# Patient Record
Sex: Female | Born: 1990 | Race: Black or African American | Hispanic: No | Marital: Single | Smoking: Never smoker
Health system: Southern US, Community
[De-identification: ages and names within clinical notes are randomized; demographics above are authoritative.]

## PROBLEM LIST (undated history)

## (undated) DIAGNOSIS — F29 Unspecified psychosis not due to a substance or known physiological condition: Secondary | ICD-10-CM

## (undated) DIAGNOSIS — J45909 Unspecified asthma, uncomplicated: Secondary | ICD-10-CM

## (undated) HISTORY — DX: Unspecified asthma, uncomplicated: J45.909

## (undated) HISTORY — PX: NO PAST SURGERIES: SHX2092

---
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
## 2013-12-02 ENCOUNTER — Emergency Department (HOSPITAL_COMMUNITY): Payer: BC Managed Care – PPO

## 2013-12-02 ENCOUNTER — Encounter (HOSPITAL_COMMUNITY): Payer: Self-pay | Admitting: Emergency Medicine

## 2013-12-02 ENCOUNTER — Emergency Department (HOSPITAL_COMMUNITY)
Admission: EM | Admit: 2013-12-02 | Discharge: 2013-12-02 | Disposition: A | Discharge status: Home / Self Care | Payer: BC Managed Care – PPO | Attending: Emergency Medicine | Admitting: Emergency Medicine

## 2013-12-02 DIAGNOSIS — Z792 Long term (current) use of antibiotics: Secondary | ICD-10-CM | POA: Insufficient documentation

## 2013-12-02 DIAGNOSIS — R0602 Shortness of breath: Secondary | ICD-10-CM | POA: Insufficient documentation

## 2013-12-02 DIAGNOSIS — Z79899 Other long term (current) drug therapy: Secondary | ICD-10-CM | POA: Insufficient documentation

## 2013-12-02 DIAGNOSIS — M129 Arthropathy, unspecified: Secondary | ICD-10-CM | POA: Insufficient documentation

## 2013-12-02 DIAGNOSIS — R011 Cardiac murmur, unspecified: Secondary | ICD-10-CM | POA: Insufficient documentation

## 2013-12-02 DIAGNOSIS — F411 Generalized anxiety disorder: Secondary | ICD-10-CM | POA: Insufficient documentation

## 2013-12-02 DIAGNOSIS — Z872 Personal history of diseases of the skin and subcutaneous tissue: Secondary | ICD-10-CM | POA: Insufficient documentation

## 2013-12-02 DIAGNOSIS — R002 Palpitations: Secondary | ICD-10-CM

## 2013-12-02 MED ORDER — CEPHALEXIN 500 MG PO CAPS: 500.0000 mg | ORAL_CAPSULE | Freq: Four times a day (QID) | ORAL | Status: DC

## 2013-12-02 NOTE — ED Provider Notes (Signed)
Patient states she had an abscess on her buttock and she was upset about considering what the treatment would be. She states she is started on her doxycycline and felt like her heart was racing. When I ask her if her heart is racing at this time she states "I can't tell". She states this never happened before.  On exam patient does have a harsh short systolic murmur that's heard best in the left upper sternal border.  Medical screening examination/treatment/procedure(s) were conducted as a shared visit with non-physician practitioner(s) and myself.  I personally evaluated the patient during the encounter.  EKG Interpretation    Date/Time:  Monday December 02 2013 18:59:33 EST Ventricular Rate:  99 PR Interval:  140 QRS Duration: 81 QT Interval:  355 QTC Calculation: 456 R Axis:   55 Text Interpretation:  Sinus rhythm Normal ECG No old tracing to compare Confirmed by Danford Tat  MD-I, Isebella Upshur ([id number]) on 12/02/2013 10:50:08 PM            Devoria AlbeIva Aliany Fiorenza, MD, Franz DellFACEP   Shanikka Wonders L Diarra Ceja, MD 12/02/13 2250

## 2013-12-02 NOTE — ED Notes (Addendum)
Pt c/o chest pain and SOB x 1 week. No issues of heart problems. Pt states she feels lke her heart is beating irregular. Pt states every since they put her on doxycycline for an abscess she has been feeling like this

## 2013-12-02 NOTE — Discharge Instructions (Signed)
Take the prescribed medication as directed if abscess begins to worsen. May use zyrtec and over the counter chloraseptic spray or salt water gargles for postnasal drip and sore throat. Follow-up with [facility] for further evaluation of your heart murmur. Return to the ED for new or worsening symptoms.

## 2013-12-02 NOTE — ED Notes (Signed)
Pt states she feels like her heart is racing and has noticed some aching. She admits to a NPC. Also states she has been under some stress: school and a minor medical procedure (I&D).

## 2013-12-02 NOTE — ED Provider Notes (Signed)
CSN: [id number]     Arrival date & time 12/02/13  1824 History   First MD Initiated Contact with Patient 12/02/13 2039     Chief Complaint  Patient presents with  . Chest Pain  . Shortness of Breath   (Consider location/radiation/quality/duration/timing/severity/associated sxs/prior Treatment) Patient is a 23 y.o. female presenting with palpitations and shortness of breath. The history is provided by the patient and medical records.  Palpitations Associated symptoms: shortness of breath   Shortness of Breath  This is a 23 year old female with a significant past medical history presenting to the ED for palpitations and shortness of breath for the past week. Pt states sx seem worse at night when she is attempting to sleep, in turn making her anxious and unable to sleep.  Patient has no prior cardiac history. No family history of sudden cardiac death. Patient states she was recently started on doxycycline for an abscess, symptoms began shortly after she started this medication. She has never taken this medication in the past.  Pt was prescribed a 10 day course, only took 4 days of the medication.  States abscess has improved, but has not completely resolved.  Pt does not some recurrent sinus congestion and PND.  Denies fevers, sweats, chills, nausea, or vomiting.  No intervention PTA.  VS stable on arrival.  Past Medical History  Diagnosis Date  . Arthritis    History reviewed. No pertinent past surgical history. No family history on file. History  Substance Use Topics  . Smoking status: Never Smoker   . Smokeless tobacco: Not on file  . Alcohol Use: No   OB History   Grav Para Term Preterm Abortions TAB SAB Ect Mult Living                 Review of Systems  Respiratory: Positive for shortness of breath.   Cardiovascular: Positive for palpitations.  All other systems reviewed and are negative.    Allergies  Review of patient's allergies indicates no known allergies.  Home  Medications   Current Outpatient Rx  Name  Route  Sig  Dispense  Refill  . albuterol (PROVENTIL HFA;VENTOLIN HFA) 108 (90 BASE) MCG/ACT inhaler   Inhalation   Inhale 2 puffs into the lungs every 6 (six) hours as needed for wheezing or shortness of breath.         . Chlorpheniramine-Acetaminophen (CORICIDIN HBP COLD/FLU PO)   Oral   Take 2 capsules by mouth once as needed (congestion).         Marland Kitchen doxycycline (VIBRAMYCIN) 100 MG capsule   Oral   Take 100 mg by mouth 2 (two) times daily.         Marland Kitchen ibuprofen (ADVIL,MOTRIN) 800 MG tablet   Oral   Take 800 mg by mouth every 8 (eight) hours as needed for moderate pain.         . Multiple Vitamins-Minerals (HM MULTIVITAMIN ADULT GUMMY) CHEW   Oral   Chew 2 each by mouth daily.          BP 154/73  Pulse 93  Temp(Src) 98.7 F (37.1 C) (Oral)  Resp 20  SpO2 100%  LMP 12/31/2012  Physical Exam  Nursing note and vitals reviewed. Constitutional: She is oriented to person, place, and time. She appears well-developed and well-nourished. No distress.  HENT:  Head: Normocephalic and atraumatic.  Right Ear: Tympanic membrane and ear canal normal.  Left Ear: Tympanic membrane and ear canal normal.  Nose: Nose normal.  Mouth/Throat: Uvula  is midline, oropharynx is clear and moist and mucous membranes are normal.  PND noted in oropharynx; tonsils normal in appearance bilaterally  Eyes: Conjunctivae and EOM are normal. Pupils are equal, round, and reactive to light.  Neck: Normal range of motion. Neck supple.  Cardiovascular: Normal rate and regular rhythm.   Murmur heard. Pulmonary/Chest: Effort normal and breath sounds normal. No respiratory distress. She has no wheezes.  Musculoskeletal: Normal range of motion.  Neurological: She is alert and oriented to person, place, and time.  Skin: Skin is warm and dry. She is not diaphoretic.  Psychiatric: She has a normal mood and affect.    ED Course  Procedures (including critical  care time) Labs Review Labs Reviewed - No data to display Imaging Review Dg Chest 2 View  12/02/2013   CLINICAL DATA:  Chest pain.  Shortness of breath.  Cough.  EXAM: CHEST  2 VIEW  COMPARISON:  None.  FINDINGS: Cardiomediastinal silhouette unremarkable. Lungs clear. Bronchovascular markings normal. Pulmonary vascularity normal. No pneumothorax. No pleural effusions. Visualized bony thorax intact.  IMPRESSION: Normal examination.   Electronically Signed   By: Hulan Saashomas  Lawrence M.D.   On: 12/02/2013 21:36    EKG Interpretation    Date/Time:  Monday December 02 2013 18:59:33 EST Ventricular Rate:  99 PR Interval:  140 QRS Duration: 81 QT Interval:  355 QTC Calculation: 456 R Axis:   55 Text Interpretation:  Sinus rhythm Normal ECG No old tracing to compare Confirmed by KNAPP  MD-I, IVA ([id number]) on 12/02/2013 10:50:08 PM            MDM   1. Palpitations    EKG NSR, no acute ischemic changes.  CXR clear.  At this time i have low suspicion for ACS, PE dissection, or other acute cardiac event.  Palpitations may be due to doxycycline vs anxiety.  On PE slight murmur is heard.  Pt will be referred to cardiology for further evaluation of this.  Advised she may wish to start a daily zyrtec as needed for PND, may use OTC chloraseptic or salt water gargles for sore throat.  Rx keflex should abscess recur.  Discussed plan with pt and mom, they acknowledged understanding and agreed with plan of care.  Strict return precautions advised for new or worsening sx.  Garlon HatchetLisa M Gentry Pilson, PA-C 12/03/13 0002

## 2013-12-03 NOTE — ED Provider Notes (Signed)
See prior note   Ward GivensIva L Jossue Rubenstein, MD 12/03/13 [phone]

## 2013-12-04 ENCOUNTER — Encounter: Payer: BC Managed Care – PPO | Admitting: Cardiology

## 2013-12-06 ENCOUNTER — Encounter (INDEPENDENT_AMBULATORY_CARE_PROVIDER_SITE_OTHER): Payer: BC Managed Care – PPO

## 2013-12-06 ENCOUNTER — Ambulatory Visit (INDEPENDENT_AMBULATORY_CARE_PROVIDER_SITE_OTHER): Payer: BC Managed Care – PPO | Admitting: Cardiology

## 2013-12-06 ENCOUNTER — Encounter: Payer: Self-pay | Admitting: Radiology

## 2013-12-06 ENCOUNTER — Encounter: Payer: Self-pay | Admitting: Cardiology

## 2013-12-06 VITALS — BP 124/82 | HR 86 | Ht 63.0 in | Wt 151.0 lb

## 2013-12-06 DIAGNOSIS — R0789 Other chest pain: Secondary | ICD-10-CM | POA: Insufficient documentation

## 2013-12-06 DIAGNOSIS — R011 Cardiac murmur, unspecified: Secondary | ICD-10-CM | POA: Insufficient documentation

## 2013-12-06 DIAGNOSIS — R002 Palpitations: Secondary | ICD-10-CM | POA: Insufficient documentation

## 2013-12-06 NOTE — Progress Notes (Signed)
  [address] [place], [place]  [address] Phone: [phone] Fax:  [phone]  Date:  12/06/2013   ID:  Veronica BaileyKyla Arias, DOB 1991/01/08, MRN [id number]  PCP:  No primary provider on file.  Cardiologist:  Armanda Magicraci Jabarie Pop, MD     History of Present Illness: Veronica BaileyKyla Arias is a 23 y.o. female  with no significant past medical history presented to the ED for palpitations and shortness of breath for the past week. Pt states sx seem worse at night when she is attempting to sleep, in turn making her anxious and unable to sleep. Patient has no prior cardiac history. No family history of sudden cardiac death. Patient states she was recently started on doxycycline for an abscess, symptoms began shortly after she started this medication. She has never taken this medication in the past. Pt was prescribed a 10 day course, only took 4 days of the medication. She was noted to have a heart mumur on exam in the Er and is now here for further evaluation.  Since stopping the antibiotics the SOB has completely resolved and the palpitations have significantly improved.  She says that the palpitations occur a few times a week.  She also says that her chest has felt tightness which is in her upper chest and radiates to the right side.  It usually occurs when she is lying down and is worse when she is tired.  If she breathes slowly is resolves.  She denies any dizziness or syncope.  The palpitations are described as skipping and sometimes racing.  Wt Readings from Last 3 Encounters:  12/06/13 151 lb (68.493 kg)     Past Medical History  Diagnosis Date  . Arthritis     Current Outpatient Prescriptions  Medication Sig Dispense Refill  . albuterol (PROVENTIL HFA;VENTOLIN HFA) 108 (90 BASE) MCG/ACT inhaler Inhale 2 puffs into the lungs every 6 (six) hours as needed for wheezing or shortness of breath.      . Multiple Vitamins-Minerals (HM MULTIVITAMIN ADULT GUMMY) CHEW Chew 2 each by mouth daily.       No  current facility-administered medications for this visit.    Allergies:   No Known Allergies  Social History:  The patient  reports that she has never smoked. She does not have any smokeless tobacco history on file. She reports that she does not drink alcohol.   Family History:  The patient's family history is not on file.   ROS:  Please see the history of present illness.      All other systems reviewed and negative.   PHYSICAL EXAM: VS:  BP 124/82  Pulse 86  Ht 5\' 3"  (1.6 m)  Wt 151 lb (68.493 kg)  BMI 26.76 kg/m2  LMP 12/31/2012 Well nourished, well developed, in no acute distress HEENT: normal Neck: no JVD Cardiac:  normal S1, S2; RRR;1/6 SM at RUSB to LLSB Lungs:  clear to auscultation bilaterally, no wheezing, rhonchi or rales Abd: soft, nontender, no hepatomegaly Ext: no edema Skin: warm and dry Neuro:  CNs 2-12 intact, no focal abnormalities noted  EKG:  NSR with  No ST changes  ASSESSMENT AND PLAN:  1. Palpitations - she drinks very little caffeine  - Event monitor to assess for arrhythmias 2. Heart murmur  - 2D echo to assess murmur 3. SOB resolved 4. Chest pain - atypical ? Anxiety  - ETT  Followup with me in 4 weeks  Signed, Armanda Magicraci Gaetana Kawahara, MD 12/06/2013 3:25 PM

## 2013-12-06 NOTE — Patient Instructions (Addendum)
Your physician has recommended that you wear an 30 DAY event monitor. Event monitors are medical devices that record the heart's electrical activity. Doctors most often us these monitors to diagnose arrhythmias. Arrhythmias are problems with the speed or rhythm of the heartbeat. The monitor is a small, portable device. You can wear one while you do your normal daily activities. This is usually used to diagnose what is causing palpitations/syncope (passing out).  Your physician has requested that you have an echocardiogram. Echocardiography is a painless test that uses sound waves to create images of your heart. It provides your doctor with information about the size and shape of your heart and how well your heart's chambers and valves are working. This procedure takes approximately one hour. There are no restrictions for this procedure.  Your physician has requested that you have an exercise tolerance test. For further information please visit [email or web address]. Please also follow instruction sheet, as given.    Your physician recommends that you continue on your current medications as directed. Please refer to the Current Medication list given to you today.  Your physician recommends that you schedule a follow-up appointment in: 4 weeks with Dr. Mayford Knifeurner

## 2013-12-06 NOTE — Progress Notes (Signed)
Patient ID: [name], female   DOB: [birth year], 23 y.o.   MRN: [id number] 30 day lifewatch monitor applied

## 2013-12-12 ENCOUNTER — Other Ambulatory Visit (HOSPITAL_COMMUNITY): Payer: BC Managed Care – PPO

## 2014-01-01 ENCOUNTER — Encounter: Payer: BC Managed Care – PPO | Admitting: Cardiology

## 2014-01-08 ENCOUNTER — Telehealth: Payer: Self-pay | Admitting: Cardiology

## 2014-01-08 NOTE — Telephone Encounter (Signed)
Please let patient know that heart monitor showed NSR with occasional sinus tachycardia up to 136bpm.  Please get a 24 hour Holter to assess average HR over 24 hour period of time

## 2014-01-08 NOTE — Telephone Encounter (Signed)
LVM for pt to return call

## 2014-01-14 NOTE — Telephone Encounter (Signed)
Unable to reach pt. Left verbal message with pts mom who answered phone. Asked her to have her call me back when she could.

## 2014-01-24 NOTE — Telephone Encounter (Signed)
LVM for pt to return call

## 2014-01-30 NOTE — Telephone Encounter (Signed)
LVM for pt to return call

## 2014-02-05 NOTE — Telephone Encounter (Signed)
LVM for pt to return call. I have been unable to get in touch with this pt. Can we please send a certified letter to pt to make sure they know to call us back regarding Heart monitor results?

## 2014-02-13 NOTE — Telephone Encounter (Signed)
Printed and gave to Okey RegalCarol to send Certified letter for pt.

## 2015-06-15 ENCOUNTER — Ambulatory Visit (INDEPENDENT_AMBULATORY_CARE_PROVIDER_SITE_OTHER): Payer: BLUE CROSS/BLUE SHIELD | Admitting: Physician Assistant

## 2015-06-15 VITALS — BP 128/80 | HR 99 | Temp 98.8°F | Resp 16 | Ht 64.0 in | Wt 153.4 lb

## 2015-06-15 DIAGNOSIS — H9202 Otalgia, left ear: Secondary | ICD-10-CM | POA: Diagnosis not present

## 2015-06-15 DIAGNOSIS — H6123 Impacted cerumen, bilateral: Secondary | ICD-10-CM | POA: Diagnosis not present

## 2015-06-15 MED ORDER — CARBAMIDE PEROXIDE 6.5 % OT SOLN: 5.0000 [drp] | Freq: Two times a day (BID) | OTIC | Status: DC

## 2015-06-15 MED ORDER — HYDROCODONE-ACETAMINOPHEN 5-325 MG PO TABS: 1.0000 | ORAL_TABLET | Freq: Four times a day (QID) | ORAL | Status: DC | PRN

## 2015-06-15 NOTE — Patient Instructions (Signed)
Cerumen Impaction °A cerumen impaction is when the wax in your ear forms a plug. This plug usually causes reduced hearing. Sometimes it also causes an earache or dizziness. Removing a cerumen impaction can be difficult and painful. The wax sticks to the ear canal. The canal is sensitive and bleeds easily. If you try to remove a heavy wax buildup with a cotton tipped swab, you may push it in further. °Irrigation with water, suction, and small ear curettes may be used to clear out the wax. If the impaction is fixed to the skin in the ear canal, ear drops may be needed for a few days to loosen the wax. People who build up a lot of wax frequently can use ear wax removal products available in your local drugstore. °SEEK MEDICAL CARE IF:  °You develop an earache, increased hearing loss, or marked dizziness. °Document Released: 12/15/2004 Document Revised: 01/30/2012 Document Reviewed: 02/04/2010 °ExitCare® Patient Information ©2015 [employer]. This information is not intended to replace advice given to you by your health care provider. Make sure you discuss any questions you have with your health care provider. ° °

## 2015-06-15 NOTE — Progress Notes (Signed)
[facility] [address], [place] [address] [phone]  Date:  06/15/2015   Name:  Veronica Arias   DOB:  [birth year]   MRN:  [id number]  PCP:  No PCP Per Patient    History of Present Illness:  Veronica Arias is a 24 y.o. female patient who presents to [facility] for left-sided ear pain that started yesterday. Patient states that 2 days ago she was washing her hair and felt that it could possibly be stopped up but then it went away. The next day it felt completely clogged and she has left-sided ear pain that can slightly radiate down to her throat.  She has no shortness of breath, trouble swallowing, sore throat, ear discharge, but may have some trouble hearing through the left ear.      Patient Active Problem List   Diagnosis Date Noted  . Palpitations 12/06/2013  . Chest pain, atypical 12/06/2013  . Heart murmur 12/06/2013    Past Medical History  Diagnosis Date  . Asthma     History reviewed. No pertinent past surgical history.  History  Substance Use Topics  . Smoking status: Never Smoker   . Smokeless tobacco: Never Used  . Alcohol Use: No    History reviewed. No pertinent family history.  No Known Allergies  Medication list has been reviewed and updated.  Current Outpatient Prescriptions on File Prior to Visit  Medication Sig Dispense Refill  . albuterol (PROVENTIL HFA;VENTOLIN HFA) 108 (90 BASE) MCG/ACT inhaler Inhale 2 puffs into the lungs every 6 (six) hours as needed for wheezing or shortness of breath.    . cetirizine (ZYRTEC) 10 MG tablet Take 10 mg by mouth daily.    . Multiple Vitamins-Minerals (HM MULTIVITAMIN ADULT GUMMY) CHEW Chew 2 each by mouth daily.     No current facility-administered medications on file prior to visit.    ROS ROS otherwise unremarkable unless listed above.  Physical Examination: BP 128/80 mmHg  Pulse 99  Temp(Src) 98.8 F (37.1 C) (Oral)  Resp 16  Ht  (1.626 m)  Wt 153 lb 6.4 oz (69.582 kg)   BMI 26.32 kg/m2  SpO2 98%  LMP 06/08/2015 Ideal Body Weight: Weight in (lb) to have BMI = 25: 145.3  Physical Exam Alert, cooperative, oriented 4 in no acute distress.  No lymphadenopathy detected.  Right and left external ear are normal without tenderness.  Cerumen impaction blocking visualization bilaterally.  Oral mucosa normal without erythema, edema, or exudate.  Right ear irrigated successfully with normal TM visualized.  The left ear continues to have generous cerumen despite multiple irrigations and softener cerumen solution.  Currette was used unsuccessfully.  Assessment and Plan: 24 year old female is here today for complaint of left ear pain.  Irrigation performed multiple times proceeding colace.  Left ear still has considerable cerumen impaction close to the TM.  I have advised her to use debrox tonight and tomorrow.  She will return to clinic for another irrigation after solution break down, to avoid continued irritation of this left ear.     1. Cerumen impaction, bilateral  Trena Platt, PA-C [facility] 06/15/2015 1:59 PM

## 2015-06-16 ENCOUNTER — Ambulatory Visit (INDEPENDENT_AMBULATORY_CARE_PROVIDER_SITE_OTHER): Payer: BLUE CROSS/BLUE SHIELD | Admitting: Physician Assistant

## 2015-06-16 VITALS — BP 128/70 | HR 84 | Temp 99.6°F | Ht 64.0 in | Wt 154.4 lb

## 2015-06-16 DIAGNOSIS — H6122 Impacted cerumen, left ear: Secondary | ICD-10-CM | POA: Diagnosis not present

## 2015-06-17 NOTE — Progress Notes (Signed)
   Subjective:    Patient ID: Veronica Arias, female    DOB: [birth year], 24 y.o.   MRN: [id number]  HPI Chief Complaint  Patient presents with  . ear irrigation    left ear (Per Thomasene Ripple) , pt was seen yesterday   Patient is returning for follow up of cerumen impaction and ear pain that was found yesterday.  When the TM could not be viewed due to cerumen impaction, multiple irrigations were used to irrigate left ear.  Cerumen had hardened around the TM, so patient was prescribed debrox to use last night and today prior to another irrigation.  She states that she has used the debrox twice.  She has had ear pain, but this has improved.  No fever, dizziness, hearing loss, or drainage.   Review of Systems ROS otherwise unremarkable unless listed above.     Objective:   Physical Exam  Constitutional: She appears well-developed and well-nourished.  HENT:  Head: Normocephalic and atraumatic.  Right Ear: Tympanic membrane, external ear and ear canal normal. No drainage. Tympanic membrane is not perforated and not erythematous.  Left Ear: No drainage. Tympanic membrane is injected. Tympanic membrane is not perforated, not erythematous and not bulging.  No middle ear effusion.       Assessment & Plan:  24 year old female with cerumen impaction is here for 2nd round or irrigation.  The debrox worked to soften cerumen, and it was removed without complication.  TM of left ear mildly injected likely due to multiple irrigation.  She will rtc in 24 hours if pain or contact by phone.  Cerumen debris on tympanic membrane of left ear  Trena Platt, PA-C [facility] 7/27/201611:05 PM

## 2019-06-28 ENCOUNTER — Other Ambulatory Visit: Payer: Self-pay

## 2019-06-29 ENCOUNTER — Encounter (HOSPITAL_COMMUNITY): Payer: Self-pay

## 2019-06-29 ENCOUNTER — Other Ambulatory Visit: Payer: Self-pay

## 2019-06-29 ENCOUNTER — Emergency Department (HOSPITAL_COMMUNITY)
Admission: EM | Admit: 2019-06-29 | Discharge: 2019-06-29 | Disposition: A | Discharge status: Home / Self Care | Payer: BC Managed Care – PPO | Attending: Emergency Medicine | Admitting: Emergency Medicine

## 2019-06-29 DIAGNOSIS — F4311 Post-traumatic stress disorder, acute: Secondary | ICD-10-CM | POA: Insufficient documentation

## 2019-06-29 DIAGNOSIS — F23 Brief psychotic disorder: Secondary | ICD-10-CM | POA: Insufficient documentation

## 2019-06-29 DIAGNOSIS — J45909 Unspecified asthma, uncomplicated: Secondary | ICD-10-CM | POA: Diagnosis not present

## 2019-06-29 DIAGNOSIS — R462 Strange and inexplicable behavior: Secondary | ICD-10-CM | POA: Diagnosis not present

## 2019-06-29 DIAGNOSIS — Z79899 Other long term (current) drug therapy: Secondary | ICD-10-CM | POA: Insufficient documentation

## 2019-06-29 DIAGNOSIS — F430 Acute stress reaction: Secondary | ICD-10-CM | POA: Diagnosis not present

## 2019-06-29 DIAGNOSIS — R410 Disorientation, unspecified: Secondary | ICD-10-CM | POA: Diagnosis not present

## 2019-06-29 DIAGNOSIS — R4182 Altered mental status, unspecified: Secondary | ICD-10-CM | POA: Diagnosis present

## 2019-06-29 DIAGNOSIS — F4322 Adjustment disorder with anxiety: Secondary | ICD-10-CM | POA: Diagnosis not present

## 2019-06-29 LAB — RAPID URINE DRUG SCREEN, HOSP PERFORMED
Amphetamines: NOT DETECTED
Barbiturates: NOT DETECTED
Benzodiazepines: POSITIVE — AB
Cocaine: NOT DETECTED
Opiates: NOT DETECTED
Tetrahydrocannabinol: NOT DETECTED

## 2019-06-29 LAB — CBC WITH DIFFERENTIAL/PLATELET
Abs Immature Granulocytes: 0.03 10*3/uL (ref 0.00–0.07)
Basophils Absolute: 0.1 10*3/uL (ref 0.0–0.1)
Basophils Relative: 1 %
Eosinophils Absolute: 0 10*3/uL (ref 0.0–0.5)
Eosinophils Relative: 0 %
HCT: 41 % (ref 36.0–46.0)
Hemoglobin: 12.7 g/dL (ref 12.0–15.0)
Immature Granulocytes: 0 %
Lymphocytes Relative: 21 %
Lymphs Abs: 2.1 10*3/uL (ref 0.7–4.0)
MCH: 25.9 pg — ABNORMAL LOW (ref 26.0–34.0)
MCHC: 31 g/dL (ref 30.0–36.0)
MCV: 83.7 fL (ref 80.0–100.0)
Monocytes Absolute: 0.6 10*3/uL (ref 0.1–1.0)
Monocytes Relative: 6 %
Neutro Abs: 7.1 10*3/uL (ref 1.7–7.7)
Neutrophils Relative %: 72 %
Platelets: 372 10*3/uL (ref 150–400)
RBC: 4.9 MIL/uL (ref 3.87–5.11)
RDW: 12.3 % (ref 11.5–15.5)
WBC: 9.9 10*3/uL (ref 4.0–10.5)
nRBC: 0 % (ref 0.0–0.2)

## 2019-06-29 LAB — COMPREHENSIVE METABOLIC PANEL
ALT: 137 U/L — ABNORMAL HIGH (ref 0–44)
AST: 59 U/L — ABNORMAL HIGH (ref 15–41)
Albumin: 4.6 g/dL (ref 3.5–5.0)
Alkaline Phosphatase: 53 U/L (ref 38–126)
Anion gap: 10 (ref 5–15)
BUN: 10 mg/dL (ref 6–20)
CO2: 23 mmol/L (ref 22–32)
Calcium: 9.6 mg/dL (ref 8.9–10.3)
Chloride: 103 mmol/L (ref 98–111)
Creatinine, Ser: 0.78 mg/dL (ref 0.44–1.00)
GFR calc Af Amer: >60 mL/min (ref >60)
GFR calc non Af Amer: >60 mL/min (ref >60)
Glucose, Bld: 117 mg/dL — ABNORMAL HIGH (ref 70–99)
Potassium: 3.9 mmol/L (ref 3.5–5.1)
Sodium: 136 mmol/L (ref 135–145)
Total Bilirubin: 0.4 mg/dL (ref 0.3–1.2)
Total Protein: 8.9 g/dL — ABNORMAL HIGH (ref 6.5–8.1)

## 2019-06-29 LAB — I-STAT BETA HCG BLOOD, ED (MC, WL, AP ONLY): I-stat hCG, quantitative: <5 m[IU]/mL (ref <5)

## 2019-06-29 LAB — ETHANOL: Alcohol, Ethyl (B): <10 mg/dL (ref <10)

## 2019-06-29 MED ORDER — DIAZEPAM 5 MG PO TABS
5.0000 mg | ORAL_TABLET | Freq: Once | ORAL | Status: AC
  Administered 2019-06-29: 03:00:00 5 mg via ORAL
  Filled 2019-06-29: qty 1

## 2019-06-29 NOTE — ED Notes (Signed)
Mother at pt bedside. Mother stated that pt has been more confused recently. Mother stated that there are "mental health issues deep in the family."   Pt appears more relaxed with mother at bedside.

## 2019-06-29 NOTE — ED Notes (Signed)
Pt/her mother called out.  Went into pt's room and the mother was requesting a sedative for the pt d/t her being anxious. Advised EDP of same.

## 2019-06-29 NOTE — ED Notes (Signed)
Pt asked if her mother could come back whenever she got here. Stating she would feel more comfortable with someone familiar with her.

## 2019-06-29 NOTE — ED Notes (Signed)
Pt mother provided with reclining chair for comfort. Both pt and mother were provided with warm blankets.

## 2019-06-29 NOTE — ED Notes (Signed)
Pt ambulated to bathroom with steady gait.   Pt states that she a slept some throughout the night. Pt cannot describe how she feels, pt appears more relaxed.

## 2019-06-29 NOTE — ED Triage Notes (Signed)
Pt was BIB [facility] from home. Family called EMS d/t pt having flat affect, not following commands, not acknowledging family members, agitated and unaware of surroundings. Family states that pt has been confused over over the past 2 days, calling them and not saying anything to them. Additionally a friend on scene stated that pt has been more confused recently.   During the episode, pt was painting a canvas and would not allow anyone to take the paint brush from her hand. EMS was able to administer 5mg  of Haldol IM. After 15 min pt began talking however, was disoriented, unable to recognize family and was agitated. EMS was able to the administer 2.5mg  if IN Versed which decreased her agitation.   Pt states that she began feeling confused on Wednesday night. Pt states that she feels like this has happened before, but cant explain it.   Pt denies ETOH, drugs, tobacco. Pt denies any previous mental health history.

## 2019-06-29 NOTE — ED Notes (Signed)
TTS now at bedside speaking with patient and patient's mother.
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
## 2019-06-29 NOTE — ED Notes (Signed)
Patient ambulated to bathroom independently.

## 2019-06-29 NOTE — BH Assessment (Addendum)
Tele Assessment Note   Patient Name: Veronica Arias MRN: [id number] Referring Physician: Nira Connardama, Pedro Eduardo, MD Location of Patient: [facility] Location of Provider: Behavioral Health TTS Department  Veronica Arias is an 28 y.o. female who presents to the ED voluntarily. Pt reportedly has been experiencing delusions and disassociation for the past several nights. Pt states her first episode occurred 3 nights ago. Pt states around 8pm each night, she has been feeling confused and disoriented. Pt reportedly did not recognize her family and has been agitated. Pt's speech was disorganized and had to be given Haldol and Versed due to agitation. Pt tells TTS that she feels "I have been going in circles." Pt states the next day when she awakens she feels "normal." Pt denies any prior psych hx and denies any specific changes or triggers in her life.  Per Donell SievertSpencer Simon, PA pt is recommended for continued observation for safety and stabilization and to be reassessed in the AM by psych. EDP Cardama, Amadeo GarnetPedro Eduardo, MD and pt's nurse Zenda AlpersWicker, Kaitlyn E, RN have been advised.    Diagnosis: Brief psychotic d/o  Past Medical History:  Past Medical History:  Diagnosis Date  . Asthma     History reviewed. No pertinent surgical history.  Family History: No family history on file.  Social History:  reports that she has never smoked. She has never used smokeless tobacco. She reports that she does not drink alcohol or use drugs.  Additional Social History:  Alcohol / Drug Use Pain Medications: See MAR Prescriptions: See MAR Over the Counter: See MAR History of alcohol / drug use?: No history of alcohol / drug abuse  CIWA: CIWA-Ar BP: (!) 147/84 Pulse Rate: 97 COWS:    Allergies: No Known Allergies  Home Medications: (Not in a hospital admission)   OB/GYN Status:  No LMP recorded.  General Assessment Data Location of Assessment: [facility] TTS Assessment: In system Is this a Tele or Face-to-Face  Assessment?: Tele Assessment Is this an Initial Assessment or a Re-assessment for this encounter?: Initial Assessment Patient Accompanied by:: Parent Language Other than English: No Living Arrangements: Other (Comment) What gender do you identify as?: Female Marital status: Single Pregnancy Status: No Living Arrangements: Alone Can pt return to current living arrangement?: Yes Admission Status: Voluntary Is patient capable of signing voluntary admission?: Yes Referral Source: Self/Family/Friend Insurance type: none     Crisis Care Plan Living Arrangements: Alone Name of Psychiatrist: none Name of Therapist: none  Education Status Is patient currently in school?: No Is the patient employed, unemployed or receiving disability?: Employed(starts new job on 07/01/19)  Risk to self with the past 6 months Suicidal Ideation: No Has patient been a risk to self within the past 6 months prior to admission? : No Suicidal Intent: No Has patient had any suicidal intent within the past 6 months prior to admission? : No Is patient at risk for suicide?: No Suicidal Plan?: No Has patient had any suicidal plan within the past 6 months prior to admission? : No Access to Means: No What has been your use of drugs/alcohol within the last 12 months?: denies Previous Attempts/Gestures: No Triggers for Past Attempts: None known Intentional Self Injurious Behavior: None Family Suicide History: No Recent stressful life event(s): Other (Comment)(disorientation) Persecutory voices/beliefs?: No Depression: No Substance abuse history and/or treatment for substance abuse?: No Suicide prevention information given to non-admitted patients: Not applicable  Risk to Others within the past 6 months Homicidal Ideation: No Does patient have any lifetime risk  of violence toward others beyond the six months prior to admission? : No Thoughts of Harm to Others: No Current Homicidal Intent: No Current Homicidal  Plan: No Access to Homicidal Means: No History of harm to others?: No Assessment of Violence: None Noted Does patient have access to weapons?: No Criminal Charges Pending?: No Does patient have a court date: No Is patient on probation?: No  Psychosis Hallucinations: None noted Delusions: Unspecified  Mental Status Report Appearance/Hygiene: Disheveled, In hospital gown Eye Contact: Good Motor Activity: Freedom of movement Speech: Slow, Soft Level of Consciousness: Quiet/awake Mood: Labile Affect: Preoccupied Anxiety Level: None Thought Processes: Thought Blocking Judgement: Partial Orientation: Person, Place Obsessive Compulsive Thoughts/Behaviors: None  Cognitive Functioning Concentration: Fair Memory: Recent Intact, Remote Intact Is patient IDD: No Insight: Fair Impulse Control: Good Appetite: Good Have you had any weight changes? : No Change Sleep: Decreased Total Hours of Sleep: 6 Vegetative Symptoms: None  ADLScreening [facility] Assessment Services) Patient's cognitive ability adequate to safely complete daily activities?: Yes Patient able to express need for assistance with ADLs?: Yes Independently performs ADLs?: Yes (appropriate for developmental age)  Prior Inpatient Therapy Prior Inpatient Therapy: No  Prior Outpatient Therapy Prior Outpatient Therapy: No Does patient have an ACCT team?: No Does patient have Intensive In-House Services?  : No Does patient have [facility] services? : No Does patient have P4CC services?: No  ADL Screening (condition at time of admission) Patient's cognitive ability adequate to safely complete daily activities?: Yes Is the patient deaf or have difficulty hearing?: No Does the patient have difficulty seeing, even when wearing glasses/contacts?: No Does the patient have difficulty concentrating, remembering, or making decisions?: No Patient able to express need for assistance with ADLs?: Yes Does the patient have difficulty  dressing or bathing?: No Independently performs ADLs?: Yes (appropriate for developmental age) Does the patient have difficulty walking or climbing stairs?: No Weakness of Legs: None Weakness of Arms/Hands: None  Home Assistive Devices/Equipment Home Assistive Devices/Equipment: Eyeglasses    Abuse/Neglect Assessment (Assessment to be complete while patient is alone) Abuse/Neglect Assessment Can Be Completed: Yes Physical Abuse: Denies Verbal Abuse: Denies Sexual Abuse: Denies Exploitation of patient/patient's resources: Denies Self-Neglect: Denies     Regulatory affairs officer (For Healthcare) Does Patient Have a Medical Advance Directive?: No Would patient like information on creating a medical advance directive?: No - Patient declined          Disposition: Per Patriciaann Clan, PA pt is recommended for continued observation for safety and stabilization and to be reassessed in the AM by psych. EDP Cardama, Grayce Sessions, MD and pt's nurse Rhoderick Moody, RN have been advised.   Disposition Initial Assessment Completed for this Encounter: Yes Disposition of Patient: (overnight OBS pending AM psych assessment) Patient refused recommended treatment: No  This service was provided via telemedicine using a 2-way, interactive audio and video technology.  Names of all persons participating in this telemedicine service and their role in this encounter. Name:  Veronica Arias Role: Patient  Name: Ardel Jagger Role: Mom  Name: Lind Covert Role: TTS       Lyanne Co 06/29/2019 2:06 AM
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
## 2019-06-29 NOTE — Progress Notes (Signed)
Per Patriciaann Clan, PA pt is recommended for continued observation for safety and stabilization and to be reassessed in the AM by psych. EDP Cardama, Grayce Sessions, MD and pt's nurse Rhoderick Moody, RN have been advised.   Lind Covert, MSW, LCSW Therapeutic Triage Specialist  [phone]

## 2019-07-01 ENCOUNTER — Other Ambulatory Visit: Payer: Self-pay

## 2019-07-01 ENCOUNTER — Inpatient Hospital Stay (HOSPITAL_COMMUNITY)
Admission: EM | Admit: 2019-07-01 | Discharge: 2019-08-05 | DRG: 885 | Disposition: A | Discharge status: Other Acute Inpatient Hospital | Payer: BC Managed Care – PPO | Attending: Internal Medicine | Admitting: Internal Medicine

## 2019-07-01 DIAGNOSIS — Z8661 Personal history of infections of the central nervous system: Secondary | ICD-10-CM

## 2019-07-01 DIAGNOSIS — R4701 Aphasia: Secondary | ICD-10-CM | POA: Diagnosis not present

## 2019-07-01 DIAGNOSIS — F061 Catatonic disorder due to known physiological condition: Secondary | ICD-10-CM | POA: Diagnosis present

## 2019-07-01 DIAGNOSIS — R4182 Altered mental status, unspecified: Secondary | ICD-10-CM

## 2019-07-01 DIAGNOSIS — R41843 Psychomotor deficit: Secondary | ICD-10-CM | POA: Diagnosis present

## 2019-07-01 DIAGNOSIS — E878 Other disorders of electrolyte and fluid balance, not elsewhere classified: Secondary | ICD-10-CM | POA: Diagnosis not present

## 2019-07-01 DIAGNOSIS — Z0189 Encounter for other specified special examinations: Secondary | ICD-10-CM

## 2019-07-01 DIAGNOSIS — N170 Acute kidney failure with tubular necrosis: Secondary | ICD-10-CM | POA: Diagnosis not present

## 2019-07-01 DIAGNOSIS — Z20828 Contact with and (suspected) exposure to other viral communicable diseases: Secondary | ICD-10-CM | POA: Diagnosis present

## 2019-07-01 DIAGNOSIS — F323 Major depressive disorder, single episode, severe with psychotic features: Principal | ICD-10-CM | POA: Diagnosis present

## 2019-07-01 DIAGNOSIS — R339 Retention of urine, unspecified: Secondary | ICD-10-CM | POA: Diagnosis not present

## 2019-07-01 DIAGNOSIS — R011 Cardiac murmur, unspecified: Secondary | ICD-10-CM | POA: Diagnosis present

## 2019-07-01 DIAGNOSIS — R0600 Dyspnea, unspecified: Secondary | ICD-10-CM

## 2019-07-01 DIAGNOSIS — E870 Hyperosmolality and hypernatremia: Secondary | ICD-10-CM | POA: Diagnosis not present

## 2019-07-01 DIAGNOSIS — J690 Pneumonitis due to inhalation of food and vomit: Secondary | ICD-10-CM | POA: Diagnosis not present

## 2019-07-01 DIAGNOSIS — M6282 Rhabdomyolysis: Secondary | ICD-10-CM | POA: Diagnosis present

## 2019-07-01 DIAGNOSIS — Z79899 Other long term (current) drug therapy: Secondary | ICD-10-CM

## 2019-07-01 DIAGNOSIS — R4589 Other symptoms and signs involving emotional state: Secondary | ICD-10-CM

## 2019-07-01 DIAGNOSIS — I10 Essential (primary) hypertension: Secondary | ICD-10-CM | POA: Diagnosis present

## 2019-07-01 DIAGNOSIS — T50905A Adverse effect of unspecified drugs, medicaments and biological substances, initial encounter: Secondary | ICD-10-CM | POA: Diagnosis not present

## 2019-07-01 DIAGNOSIS — F202 Catatonic schizophrenia: Secondary | ICD-10-CM | POA: Diagnosis present

## 2019-07-01 DIAGNOSIS — R630 Anorexia: Secondary | ICD-10-CM | POA: Diagnosis not present

## 2019-07-01 DIAGNOSIS — Z781 Physical restraint status: Secondary | ICD-10-CM

## 2019-07-01 DIAGNOSIS — E876 Hypokalemia: Secondary | ICD-10-CM | POA: Diagnosis not present

## 2019-07-01 DIAGNOSIS — F419 Anxiety disorder, unspecified: Secondary | ICD-10-CM | POA: Diagnosis present

## 2019-07-01 DIAGNOSIS — D649 Anemia, unspecified: Secondary | ICD-10-CM | POA: Diagnosis present

## 2019-07-01 DIAGNOSIS — E669 Obesity, unspecified: Secondary | ICD-10-CM | POA: Diagnosis present

## 2019-07-01 DIAGNOSIS — F309 Manic episode, unspecified: Secondary | ICD-10-CM

## 2019-07-01 DIAGNOSIS — R0602 Shortness of breath: Secondary | ICD-10-CM

## 2019-07-01 DIAGNOSIS — N179 Acute kidney failure, unspecified: Secondary | ICD-10-CM

## 2019-07-01 DIAGNOSIS — R000 Tachycardia, unspecified: Secondary | ICD-10-CM | POA: Diagnosis present

## 2019-07-01 DIAGNOSIS — R45851 Suicidal ideations: Secondary | ICD-10-CM

## 2019-07-01 DIAGNOSIS — R509 Fever, unspecified: Secondary | ICD-10-CM | POA: Diagnosis not present

## 2019-07-01 DIAGNOSIS — E871 Hypo-osmolality and hyponatremia: Secondary | ICD-10-CM | POA: Diagnosis not present

## 2019-07-01 DIAGNOSIS — F29 Unspecified psychosis not due to a substance or known physiological condition: Secondary | ICD-10-CM | POA: Diagnosis present

## 2019-07-01 DIAGNOSIS — G2589 Other specified extrapyramidal and movement disorders: Secondary | ICD-10-CM | POA: Diagnosis not present

## 2019-07-01 DIAGNOSIS — J45909 Unspecified asthma, uncomplicated: Secondary | ICD-10-CM | POA: Diagnosis present

## 2019-07-01 DIAGNOSIS — G9340 Encephalopathy, unspecified: Secondary | ICD-10-CM | POA: Diagnosis not present

## 2019-07-01 DIAGNOSIS — B370 Candidal stomatitis: Secondary | ICD-10-CM | POA: Diagnosis not present

## 2019-07-01 DIAGNOSIS — Z6836 Body mass index (BMI) 36.0-36.9, adult: Secondary | ICD-10-CM

## 2019-07-01 LAB — CBC WITH DIFFERENTIAL/PLATELET
Abs Immature Granulocytes: 0.03 10*3/uL (ref 0.00–0.07)
Basophils Absolute: 0.1 10*3/uL (ref 0.0–0.1)
Basophils Relative: 1 %
Eosinophils Absolute: 0 10*3/uL (ref 0.0–0.5)
Eosinophils Relative: 0 %
HCT: 38.7 % (ref 36.0–46.0)
Hemoglobin: 12 g/dL (ref 12.0–15.0)
Immature Granulocytes: 0 %
Lymphocytes Relative: 28 %
Lymphs Abs: 2.6 10*3/uL (ref 0.7–4.0)
MCH: 26 pg (ref 26.0–34.0)
MCHC: 31 g/dL (ref 30.0–36.0)
MCV: 83.9 fL (ref 80.0–100.0)
Monocytes Absolute: 0.8 10*3/uL (ref 0.1–1.0)
Monocytes Relative: 9 %
Neutro Abs: 5.7 10*3/uL (ref 1.7–7.7)
Neutrophils Relative %: 62 %
Platelets: 362 10*3/uL (ref 150–400)
RBC: 4.61 MIL/uL (ref 3.87–5.11)
RDW: 12.4 % (ref 11.5–15.5)
WBC: 9.2 10*3/uL (ref 4.0–10.5)
nRBC: 0 % (ref 0.0–0.2)

## 2019-07-01 LAB — ACETAMINOPHEN LEVEL: Acetaminophen (Tylenol), Serum: <10 ug/mL — ABNORMAL LOW (ref 10–30)

## 2019-07-01 LAB — COMPREHENSIVE METABOLIC PANEL
ALT: 145 U/L — ABNORMAL HIGH (ref 0–44)
AST: 76 U/L — ABNORMAL HIGH (ref 15–41)
Albumin: 4.4 g/dL (ref 3.5–5.0)
Alkaline Phosphatase: 52 U/L (ref 38–126)
Anion gap: 13 (ref 5–15)
BUN: 11 mg/dL (ref 6–20)
CO2: 22 mmol/L (ref 22–32)
Calcium: 9.5 mg/dL (ref 8.9–10.3)
Chloride: 103 mmol/L (ref 98–111)
Creatinine, Ser: 0.96 mg/dL (ref 0.44–1.00)
GFR calc Af Amer: >60 mL/min (ref >60)
GFR calc non Af Amer: >60 mL/min (ref >60)
Glucose, Bld: 94 mg/dL (ref 70–99)
Potassium: 3.6 mmol/L (ref 3.5–5.1)
Sodium: 138 mmol/L (ref 135–145)
Total Bilirubin: 1 mg/dL (ref 0.3–1.2)
Total Protein: 8.2 g/dL — ABNORMAL HIGH (ref 6.5–8.1)

## 2019-07-01 LAB — ETHANOL: Alcohol, Ethyl (B): <10 mg/dL (ref <10)

## 2019-07-01 LAB — I-STAT BETA HCG BLOOD, ED (MC, WL, AP ONLY): I-stat hCG, quantitative: <5 m[IU]/mL (ref <5)

## 2019-07-01 LAB — SARS CORONAVIRUS 2 BY RT PCR (HOSPITAL ORDER, PERFORMED IN ~~LOC~~ HOSPITAL LAB): SARS Coronavirus 2: NEGATIVE

## 2019-07-01 LAB — SALICYLATE LEVEL: Salicylate Lvl: <7 mg/dL (ref 2.8–30.0)

## 2019-07-01 MED ORDER — ZIPRASIDONE MESYLATE 20 MG IM SOLR
10.0000 mg | Freq: Once | INTRAMUSCULAR | Status: AC
  Administered 2019-07-01: 10 mg via INTRAMUSCULAR

## 2019-07-01 MED ORDER — ZIPRASIDONE MESYLATE 20 MG IM SOLR
10.0000 mg | Freq: Once | INTRAMUSCULAR | Status: AC | PRN
  Administered 2019-07-01: 23:00:00 10 mg via INTRAMUSCULAR
  Filled 2019-07-01: qty 20

## 2019-07-01 MED ORDER — ZIPRASIDONE MESYLATE 20 MG IM SOLR: 20.0000 mg | Freq: Once | INTRAMUSCULAR | Status: DC

## 2019-07-01 MED ORDER — LORAZEPAM 1 MG PO TABS
1.0000 mg | ORAL_TABLET | Freq: Four times a day (QID) | ORAL | Status: DC | PRN
  Administered 2019-07-04: 1 mg via ORAL
  Filled 2019-07-01: qty 1

## 2019-07-01 MED ORDER — STERILE WATER FOR INJECTION IJ SOLN
INTRAMUSCULAR | Status: AC
  Filled 2019-07-01: qty 10

## 2019-07-01 NOTE — ED Notes (Signed)
EDP advised me to keep pt restrained.

## 2019-07-01 NOTE — ED Notes (Signed)
Called lab to add on additional blood

## 2019-07-01 NOTE — ED Notes (Signed)
Messaged EDP to follow up on transitioning pt from violent restraints to non violent restraints and meds for transition.

## 2019-07-01 NOTE — ED Notes (Signed)
Called staffing to request sitter for this pt and was told to pull a sitter from another assignment.
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
## 2019-07-01 NOTE — ED Provider Notes (Signed)
[facility] Provider Note   CSN: [id number] Arrival date & time: 07/01/19  [id number]    History   Chief Complaint Chief Complaint  Patient presents with  . Altered Mental Status    HPI Veronica Arias is a 28 y.o. female.     The history is provided by the patient.  Mental Health Problem Presenting symptoms: bizarre behavior, paranoid behavior and suicidal thoughts   Patient accompanied by:  Law enforcement Degree of incapacity (severity):  Severe Onset quality:  Sudden Timing:  Constant Progression:  Unchanged Chronicity:  Recurrent Context: not noncompliant   Treatment compliance:  Untreated Relieved by:  Nothing Worsened by:  Family interactions Associated symptoms: irritability and poor judgment   Associated symptoms: no abdominal pain and no chest pain   Risk factors: no hx of mental illness     Past Medical History:  Diagnosis Date  . Asthma     Patient Active Problem List   Diagnosis Date Noted  . Acute stress disorder 06/29/2019  . Adjustment disorder with anxiety 06/29/2019  . Bizarre behavior   . Palpitations 12/06/2013  . Chest pain, atypical 12/06/2013  . Heart murmur 12/06/2013    No past surgical history on file.   OB History   No obstetric history on file.      Home Medications    Prior to Admission medications   Medication Sig Start Date End Date Taking? Authorizing Provider  Multiple Vitamins-Minerals (HM MULTIVITAMIN ADULT GUMMY) CHEW Chew 2 each by mouth daily.   Yes [provider]    Family History No family history on file.  Social History Social History   Tobacco Use  . Smoking status: Never Smoker  . Smokeless tobacco: Never Used  Substance Use Topics  . Alcohol use: No  . Drug use: No     Allergies   Patient has no known allergies.   Review of Systems Review of Systems  Constitutional: Positive for irritability. Negative for chills and fever.  HENT: Negative for  ear pain and sore throat.   Eyes: Negative for pain and visual disturbance.  Respiratory: Negative for cough and shortness of breath.   Cardiovascular: Negative for chest pain and palpitations.  Gastrointestinal: Negative for abdominal pain and vomiting.  Genitourinary: Negative for dysuria and hematuria.  Musculoskeletal: Negative for arthralgias and back pain.  Skin: Negative for color change and rash.  Neurological: Negative for seizures and syncope.  Psychiatric/Behavioral: Positive for paranoia and suicidal ideas.  All other systems reviewed and are negative.    Physical Exam Updated Vital Signs  ED Triage Vitals [07/01/19 0916]  Enc Vitals Group     BP 134/83     Pulse Rate (!) 111     Resp 16     Temp 97.9 F (36.6 C)     Temp Source Axillary     SpO2 97 %     Weight      Height      Head Circumference      Peak Flow      Pain Score 0     Pain Loc      Pain Edu?      Excl. in GC?     Physical Exam Vitals signs and nursing note reviewed.  Constitutional:      General: She is in acute distress.     Appearance: She is well-developed.  HENT:     Head: Normocephalic and atraumatic.     Nose: Nose normal.  Mouth/Throat:     Mouth: Mucous membranes are moist.  Eyes:     Conjunctiva/sclera: Conjunctivae normal.  Neck:     Musculoskeletal: Normal range of motion and neck supple. No neck rigidity or muscular tenderness.  Cardiovascular:     Rate and Rhythm: Regular rhythm. Tachycardia present.     Heart sounds: Normal heart sounds. No murmur.  Pulmonary:     Effort: Pulmonary effort is normal. No respiratory distress.     Breath sounds: Normal breath sounds.  Abdominal:     Palpations: Abdomen is soft.     Tenderness: There is no abdominal tenderness.  Musculoskeletal: Normal range of motion.        General: No tenderness.  Skin:    General: Skin is warm and dry.  Neurological:     General: No focal deficit present.     Mental Status: She is alert.   Psychiatric:        Mood and Affect: Affect is labile.        Speech: Speech is rapid and pressured.        Behavior: Behavior is aggressive.        Thought Content: Thought content includes suicidal ideation.        Judgment: Judgment is impulsive.      ED Treatments / Results  Labs (all labs ordered are listed, but only abnormal results are displayed) Labs Reviewed  COMPREHENSIVE METABOLIC PANEL - Abnormal; Notable for the following components:      Result Value   Total Protein 8.2 (*)    AST 76 (*)    ALT 145 (*)    All other components within normal limits  ACETAMINOPHEN LEVEL - Abnormal; Notable for the following components:   Acetaminophen (Tylenol), Serum <10 (*)    All other components within normal limits  ETHANOL  CBC WITH DIFFERENTIAL/PLATELET  SALICYLATE LEVEL  RAPID URINE DRUG SCREEN, HOSP PERFORMED  I-STAT BETA HCG BLOOD, ED ([facility], [facility], [facility] ONLY)    EKG EKG Interpretation  Date/Time:  Monday July 01 2019 09:28:10 EDT Ventricular Rate:  122 PR Interval:    QRS Duration: 79 QT Interval:  327 QTC Calculation: 466 R Axis:   72 Text Interpretation:  Sinus tachycardia Confirmed by Virgina NorfolkAdam, Kaesha Kirsch [phone]) on 07/01/2019 9:32:00 AM   Radiology No results found.  Procedures .Critical Care Performed by: Virgina Norfolkuratolo, Dafney Farler, DO Authorized by: Virgina Norfolkuratolo, Yehudit Fulginiti, DO   Critical care provider statement:    Critical care time (minutes):  35   Critical care time was exclusive of:  Separately billable procedures and treating other patients and teaching time   Critical care was necessary to treat or prevent imminent or life-threatening deterioration of the following conditions:  CNS failure or compromise   Critical care was time spent personally by me on the following activities:  Blood draw for specimens, development of treatment plan with patient or surrogate, evaluation of patient's response to treatment, examination of patient, interpretation of cardiac output measurements,  obtaining history from patient or surrogate, ordering and performing treatments and interventions, ordering and review of laboratory studies, ordering and review of radiographic studies, pulse oximetry, re-evaluation of patient's condition and review of old charts   I assumed direction of critical care for this patient from another provider in my specialty: no     (including critical care time)  Medications Ordered in ED Medications  ziprasidone (GEODON) injection 10 mg (10 mg Intramuscular Given 07/01/19 0922)     Initial Impression / Assessment and Plan /  ED Course  I have reviewed the triage vital signs and the nursing notes.  Pertinent labs & imaging results that were available during my care of the patient were reviewed by me and considered in my medical decision making (see chart for details).     Hedy Garro is a 28 year old female with history of asthma who presents to the ED for bizarre behavior, suicidal concerns.  Patient had recent emergency department visit 2 days ago for similar behavior.  Patient per police was found on her balcony of her apartment building throwing things and saying she was suicidal and was going to jump.  She has had very erratic behavior this morning.  She states to me she does not know why she feels this way, she is starting to sing songs in the room and exhibiting manic behavior.  She does not have any fever.  Normal vitals.  She moves all extremities.  No signs of trauma.  She is anxious and jittery and is trying to get up out of bed and leave.  She denies any drug use or alcohol use.  Her drug screen was unremarkable last ED visit.  She was given Haldol and Versed which is why she likely was positive for benzodiazepines.  She was cleared medically and by psychiatry at that time.  However, concern for possibly new psychotic break.  She did exhibit to me that she is depressed or upset about something but would not talk to me about it.  She did need to receive  IM Geodon because she was starting to get violent and aggressive with staff and myself and trying to elope from the department and she does not appear to be stable at this time to make those decisions.  IVC was filled out.  Will perform medical clearance.  EKG showed sinus rhythm.  Normal QTC.  Will do medical clearance and anticipate psychiatry evaluation.  Patient medically cleared.  To be evaluated by psychiatry.  This chart was dictated using voice recognition software.  Despite best efforts to proofread,  errors can occur which can change the documentation meaning.    Final Clinical Impressions(s) / ED Diagnoses   Final diagnoses:  Suicidal behavior without attempted self-injury  [facility])    ED Discharge Orders    None       Lennice Sites, DO 07/01/19 1332

## 2019-07-01 NOTE — ED Notes (Signed)
Spoke with Charge and EDP regarding transitioning this pt out of restraints. EDP feels pt needs to be restrained.

## 2019-07-01 NOTE — ED Notes (Signed)
Pt mother and sitter at bedside.

## 2019-07-01 NOTE — ED Provider Notes (Signed)
4:51 PM I was asked to see this patient by the nurse and Dr. Zenia Resides.  Patient was seen previously by Dr. Ronnald Nian, given Geodon for agitation and placed in restraints.  Patient is here today with suicidal ideations, paranoid and bizarre behavior.  History taken from the chart.  Patient was unable to be assessed by TTS because of sedation and her not being cooperative.  It looks like we have spoken with the patient's family.  Patient is more awake but is angry.  She is not currently agitated.  She states that she wishes to be with "hurry up".  She does not answer questions with direct statements.  She has been thrashing her legs in bed at times.  Will continue to monitor and reassess need for restraints or additional medication.  TTS will attempt to reassess when able.  BP 128/79   Pulse 97   Temp 97.9 F (36.6 C) (Axillary)   Resp 20   SpO2 100%    11:15 PM Continuing to await TTS consult. Patient now in safety restraints. Geodon prn.   Upstill PA-C aware of patient.   BP 137/79   Pulse 95   Temp 97.9 F (36.6 C) (Axillary)   Resp 20   SpO2 100%     Carlisle Cater, PA-C 07/01/19 2319    Carmin Muskrat, MD 07/04/19 [phone]

## 2019-07-01 NOTE — ED Notes (Signed)
Pt sitting up in the bed, able to get her foot loose and slammed the door to the room with her foot.

## 2019-07-01 NOTE — BH Assessment (Signed)
Attempted to reassess pt again, and she continues to be too sedated to participate in assessment. Her mother is still in the room and wanted to make sure to note that pt was "frightened" in the past few days, and she does not know why.

## 2019-07-01 NOTE — ED Notes (Signed)
Pt threw dinner tray.

## 2019-07-01 NOTE — ED Notes (Signed)
Called BH patient assessment to get TTS. They indicated it would be at least an hour before they can see them.

## 2019-07-01 NOTE — ED Notes (Signed)
Removed right wrist restraint, others will be removed as tolerated by pt.

## 2019-07-02 ENCOUNTER — Other Ambulatory Visit (HOSPITAL_COMMUNITY): Payer: Self-pay | Admitting: Psychiatry

## 2019-07-02 MED ORDER — LORAZEPAM 2 MG/ML IJ SOLN
2.0000 mg | Freq: Once | INTRAMUSCULAR | Status: AC
  Administered 2019-07-02: 1 mg via INTRAVENOUS
  Filled 2019-07-02: qty 1

## 2019-07-02 MED ORDER — LORAZEPAM 2 MG/ML IJ SOLN: 1.0000 mg | Freq: Once | INTRAMUSCULAR | Status: DC

## 2019-07-02 MED ORDER — DIPHENHYDRAMINE HCL 50 MG/ML IJ SOLN
25.0000 mg | Freq: Once | INTRAMUSCULAR | Status: AC
  Administered 2019-07-02: 25 mg via INTRAMUSCULAR
  Filled 2019-07-02: qty 1

## 2019-07-02 MED ORDER — ZIPRASIDONE MESYLATE 20 MG IM SOLR
10.0000 mg | Freq: Once | INTRAMUSCULAR | Status: AC | PRN
  Administered 2019-07-02: 10 mg via INTRAMUSCULAR
  Filled 2019-07-02: qty 20

## 2019-07-02 MED ORDER — LORAZEPAM 2 MG/ML IJ SOLN
2.0000 mg | Freq: Once | INTRAMUSCULAR | Status: AC
  Administered 2019-07-02: 11:00:00 2 mg via INTRAMUSCULAR
  Filled 2019-07-02: qty 1

## 2019-07-02 MED ORDER — HALOPERIDOL LACTATE 5 MG/ML IJ SOLN
5.0000 mg | Freq: Once | INTRAMUSCULAR | Status: DC
  Filled 2019-07-02: qty 1

## 2019-07-02 MED ORDER — STERILE WATER FOR INJECTION IJ SOLN
INTRAMUSCULAR | Status: AC
  Administered 2019-07-02: 10 mL
  Filled 2019-07-02: qty 10

## 2019-07-02 MED ORDER — OLANZAPINE 10 MG IM SOLR
10.0000 mg | Freq: Once | INTRAMUSCULAR | Status: AC
  Administered 2019-07-02: 10 mg via INTRAMUSCULAR
  Filled 2019-07-02: qty 10

## 2019-07-02 MED ORDER — DIPHENHYDRAMINE HCL 50 MG/ML IJ SOLN
50.0000 mg | Freq: Once | INTRAMUSCULAR | Status: AC
  Administered 2019-07-02: 16:00:00 50 mg via INTRAMUSCULAR
  Filled 2019-07-02: qty 1

## 2019-07-02 MED ORDER — LORAZEPAM 2 MG/ML IJ SOLN
1.0000 mg | Freq: Once | INTRAMUSCULAR | Status: AC
  Administered 2019-07-02: 1 mg via INTRAMUSCULAR
  Filled 2019-07-02: qty 1

## 2019-07-02 MED ORDER — STERILE WATER FOR INJECTION IJ SOLN
INTRAMUSCULAR | Status: AC
  Administered 2019-07-02: 16:00:00 10 mL
  Filled 2019-07-02: qty 10

## 2019-07-02 MED ORDER — DIPHENHYDRAMINE HCL 50 MG/ML IJ SOLN
50.0000 mg | Freq: Once | INTRAMUSCULAR | Status: AC
  Administered 2019-07-02: 25 mg via INTRAVENOUS
  Filled 2019-07-02: qty 1

## 2019-07-02 MED ORDER — OLANZAPINE 10 MG IM SOLR
10.0000 mg | Freq: Once | INTRAMUSCULAR | Status: AC
  Administered 2019-07-02: 23:00:00 10 mg via INTRAMUSCULAR
  Filled 2019-07-02: qty 10

## 2019-07-02 NOTE — ED Notes (Signed)
Pt attempted to escaped unit. RN redirected Pt to Rm. As Pt being escorted to Rm. Pt stated she felt like she's going to pass out. Pt fell to floor.

## 2019-07-02 NOTE — ED Notes (Signed)
Removed soft-wrist and ankle restraints.

## 2019-07-02 NOTE — ED Provider Notes (Signed)
I was called over to psych for an agitated patient.  Please see previous providers note for full H&P.  In short patient here for altered mental status.  Patient is acutely agitated she is hollering trying to run out of the room, she is fighting with nursing staff and security.  Patient require medication for acute agitation.  She had 2 mg of Ativan earlier in the day which did not improve her symptoms.  EKG was reviewed with no significant abnormalities.  10 mg of IM Geodon will be given.  Patient was reassessed she is resting comfortably in exam bed in no acute distress  CRITICAL CARE Performed by: Stevie Kern Jahmeek Shirk   Total critical care time: 35 minutes  Critical care time was exclusive of separately billable procedures and treating other patients.  Critical care was necessary to treat or prevent imminent or life-threatening deterioration.  Critical care was time spent personally by me on the following activities: development of treatment plan with patient and/or surrogate as well as nursing, discussions with consultants, evaluation of patient's response to treatment, examination of patient, obtaining history from patient or surrogate, ordering and performing treatments and interventions, ordering and review of laboratory studies, ordering and review of radiographic studies, pulse oximetry and re-evaluation of patient's condition.   Okey Regal, PA-C 07/02/19 [place], MD 07/03/19 [phone]

## 2019-07-02 NOTE — BH Assessment (Signed)
Tele Assessment Note   Patient Name: Veronica Arias MRN: [id number] Referring Physician: Dr. Lennice Sites, DO Location of Patient: [facility] Location of Provider: [place]  Veronica Arias is a 28 y.o. female who was brought to [facility] via EMS to be evaluated due to erratic behavior and IVC'd by ED providers.  Pt admits to having suicidal thoughts.  Pt states "I been feeling suicidal for 2 days."  Pt admits to having auditory and visual hallucinations.  Pt states "I started hearing voices 2 days ago and I started seeing things too."  Pt states "the voices are telling me to do things to my face."  Pt's speech is disorganized and incoherent at times.  Pt denies HI/SA.   Pt reports living alone and working a job.  Pt denies a history of inpatient/outpatient MH/SA treatment.  Pt denies a history of physical, sexual and verbal abuse.  Patient was wearing scrubs and appeared appropriately groomed.  Pt was somnolent throughout the assessment.  Patient made poor eye contact and had normal psychomotor activity.  Patient spoke in a soft voice without pressured speech.  Pt expressed feeling bad.  Pt's affect appeared dysphoric and congruent with stated mood. Pt's thought process was incoherent and disoriented at times.  Pt presented with partial insight and judgement.  Pt did not appear to be responding to internal stimuli.  Pt was not able to reliably contract for safety.  Disposition: [place] discussed case with [place] Provider, Agustina Caroli, NP who recommends inpatient treatment.  TTS will look for inpatient placement.  Diagnosis: F23 Brief Psychotic Disorder  Past Medical History:  Past Medical History:  Diagnosis Date  . Asthma     No past surgical history on file.  Family History: No family history on file.  Social History:  reports that she has never smoked. She has never used smokeless tobacco. She reports that she does not drink alcohol or use  drugs.  Additional Social History:  Alcohol / Drug Use Pain Medications: See MARs Prescriptions: See MARs Over the Counter: See MARs History of alcohol / drug use?: No history of alcohol / drug abuse  CIWA: CIWA-Ar BP: 134/80 Pulse Rate: 97 COWS:    Allergies: No Known Allergies  Home Medications: (Not in a hospital admission)   OB/GYN Status:  No LMP recorded.  General Assessment Data Assessment unable to be completed: Yes Reason for not completing assessment: pt was given geodon and is unable to participate in assessment at this time. Location of Assessment: [facility] ED TTS Assessment: In system Is this a Tele or Face-to-Face Assessment?: Tele Assessment Is this an Initial Assessment or a Re-assessment for this encounter?: Initial Assessment Patient Accompanied by:: N/A Language Other than English: No Living Arrangements: Other (Comment) What gender do you identify as?: Female Marital status: Single Pregnancy Status: No Living Arrangements: Alone Can pt return to current living arrangement?: Yes Admission Status: Involuntary Petitioner: ED Attending Is patient capable of signing voluntary admission?: No Referral Source: Self/Family/Friend     Crisis Care Plan Living Arrangements: Alone Name of Psychiatrist: None Name of Therapist: None  Education Status Is patient currently in school?: No Is the patient employed, unemployed or receiving disability?: Employed(Scheduled to started job on 07/01/2019)  Risk to self with the past 6 months Suicidal Ideation: Yes-Currently Present Has patient been a risk to self within the past 6 months prior to admission? : No Suicidal Intent: No Has patient had any suicidal intent within the past 6 months  prior to admission? : No Is patient at risk for suicide?: Yes Suicidal Plan?: No Has patient had any suicidal plan within the past 6 months prior to admission? : No Access to Means: No What has been your use of drugs/alcohol within  the last 12 months?: denies Previous Attempts/Gestures: No Triggers for Past Attempts: None known Intentional Self Injurious Behavior: None Family Suicide History: No Recent stressful life event(s): Other (Comment) Persecutory voices/beliefs?: No Depression: No Depression Symptoms: Despondent Substance abuse history and/or treatment for substance abuse?: No Suicide prevention information given to non-admitted patients: Not applicable  Risk to Others within the past 6 months Homicidal Ideation: No Does patient have any lifetime risk of violence toward others beyond the six months prior to admission? : No Thoughts of Harm to Others: No Current Homicidal Intent: No Current Homicidal Plan: No Access to Homicidal Means: No History of harm to others?: No Assessment of Violence: None Noted Does patient have access to weapons?: No Criminal Charges Pending?: No Does patient have a court date: No Is patient on probation?: No  Psychosis Hallucinations: Auditory, Visual, With command Delusions: Unspecified  Mental Status Report Appearance/Hygiene: In scrubs Eye Contact: Poor Motor Activity: Freedom of movement Speech: Incoherent, Soft Level of Consciousness: Quiet/awake, Drowsy Mood: Other (Comment) Affect: Blunted Anxiety Level: None Thought Processes: Flight of Ideas Judgement: Partial Orientation: Person, Not oriented Obsessive Compulsive Thoughts/Behaviors: None  Cognitive Functioning Concentration: Fair Memory: Recent Intact, Remote Intact Is patient IDD: No Insight: Fair Impulse Control: Good Appetite: Good Have you had any weight changes? : No Change Sleep: Decreased Total Hours of Sleep: 6 Vegetative Symptoms: None  ADLScreening [facility] Assessment Services) Patient's cognitive ability adequate to safely complete daily activities?: Yes Patient able to express need for assistance with ADLs?: Yes Independently performs ADLs?: Yes (appropriate for developmental  age)  Prior Inpatient Therapy Prior Inpatient Therapy: No  Prior Outpatient Therapy Prior Outpatient Therapy: No Does patient have an ACCT team?: No Does patient have Intensive In-House Services?  : No Does patient have [facility] services? : No Does patient have P4CC services?: No  ADL Screening (condition at time of admission) Patient's cognitive ability adequate to safely complete daily activities?: Yes Is the patient deaf or have difficulty hearing?: No Does the patient have difficulty seeing, even when wearing glasses/contacts?: No Does the patient have difficulty concentrating, remembering, or making decisions?: No Patient able to express need for assistance with ADLs?: Yes Does the patient have difficulty dressing or bathing?: No Independently performs ADLs?: Yes (appropriate for developmental age) Does the patient have difficulty walking or climbing stairs?: No Weakness of Legs: None Weakness of Arms/Hands: None  Home Assistive Devices/Equipment Home Assistive Devices/Equipment: None    Abuse/Neglect Assessment (Assessment to be complete while patient is alone) Abuse/Neglect Assessment Can Be Completed: Yes Physical Abuse: Denies Verbal Abuse: Denies Sexual Abuse: Denies Exploitation of patient/patient's resources: Denies     Merchant navy officerAdvance Directives (For Healthcare) Does Patient Have a Medical Advance Directive?: No Would patient like information on creating a medical advance directive?: No - Patient declined Nutrition Screen- [facility] Patient's home diet: NPO        [facility] with [facility] Provider, Serena ColonelAggie Nwoko, NP who recommends inpatient treatment.  TTS will look for inpatient placement.  Disposition Initial Assessment Completed for this Encounter: Yes  This service was provided via telemedicine using a 2-way, interactive audio and video technology.  Names of all persons participating in this telemedicine service and their role in this  encounter. Name:  Brihana Curtice Role: Patient  Name: Kayne Yuhas Thayer DallasL Ketan Renz, MS, [facility], NCC Role: Triage Specialist  Name: Serena ColonelAggie Nwoko, NP Role: [facility]  Name:  Role:     Tyron RussellChristel L Slade [place], MS, [facility], NCC 07/02/2019 8:06 AM

## 2019-07-02 NOTE — ED Notes (Signed)
Pt again stood up and walked out of room, I asked her to come back and sit back down, charge nurse assisted to get pt back to room, pt now laying in bed. Nurse notified of both episodes.

## 2019-07-02 NOTE — ED Notes (Signed)
Pt attempted to run, pt ran passed sitter and was attempting to run into hall.  Pt was stopped by 4 sitters and brought back to room.

## 2019-07-02 NOTE — ED Notes (Signed)
Diet was ordered for Lunch. 
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
## 2019-07-02 NOTE — BHH Counselor (Signed)
  [place] ASSESSMENT DISPOSITION:   [facility] discussed case with [place] Provider, Agustina Caroli, NP who recommends inpatient treatment.  TTS will look for inpatient placement.  Shea Kapur L. [place], [place], [facility], [facility] Therapeutic Triage Specialist  [phone]

## 2019-07-02 NOTE — ED Notes (Signed)
Pt woke up and asked, "where am I?". She was given update and informed that she is an IVC pt at [facility].

## 2019-07-02 NOTE — ED Notes (Signed)
Pt stood up, this NT asked what she was doing, she look at me and said " Girl bye." then proceeded to walk out door and down hall towards door [employer] officer was walking by and I waved him over and she turned to go back to room. Pt now back in room sitting on bed.

## 2019-07-02 NOTE — Progress Notes (Signed)
Seeking inpatient psychiatric treatment for pt under IVC. Referrals made to: Pending - Request [facility] Details      [place] Details      [facility] Details      [facility] Details      [facility] Details      [facility] Details      [place] Details      [facility] Details      [facility] Details     Also considered for [facility] admission however no beds available currently.  Noted pt's UDS has not resulted yet.  Sharren Bridge, MSW, LCSW Transitions of Care 07/02/2019 [phone]

## 2019-07-02 NOTE — ED Notes (Signed)
Cookies was placed on patient Bedside Table.
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
## 2019-07-02 NOTE — ED Notes (Signed)
Pt attempted to Leave. RN and [employer] redirected Pt.

## 2019-07-02 NOTE — ED Provider Notes (Signed)
I was called over due to agitation.  At the time, I was in a procedure but was notified that the patient was agitated, screaming and yelling and refusing to stay in the stretcher.  I discussed with RN that I was unable to come immediately but would come after procedure.   On my evaluation, patient was restrained but continued to yell.  Review of her EKG showed QT C of 470.  Will plan for Ativan and reassess.  Patient still agitated and attempting to escape from unit.  Will give her Zyprexa.  Portions of this note were generated with Lobby[employer]. Dictation errors may occur despite best attempts at proofreading.    Volanda Napoleon, PA-C 07/02/19 [phone]    Margette Fast, MD 07/03/19 0930

## 2019-07-02 NOTE — ED Notes (Signed)
RN updated Mother Hilda Blades on Pt's current POC as well as reiterated that Pt is not allowed any visitors at this time

## 2019-07-02 NOTE — ED Notes (Signed)
CN aware there are no sitters assigned to this Pt and that Pt is IVC w/ violent behavior

## 2019-07-02 NOTE — ED Notes (Signed)
Pt discontinued call with TTS and attempted to leave. RN and [employer] reoriented Pt and got her back in room and TTS online to continue assessment

## 2019-07-02 NOTE — ED Notes (Signed)
Breakfast ordered 

## 2019-07-02 NOTE — ED Notes (Signed)
Pt trying to come out of room  Pulling clothes off, standing on stretcher naked.  Pt can not be redirected at this time.   New orders received for this pt from [place].

## 2019-07-02 NOTE — ED Notes (Signed)
Pt up to bathroom and sitting up eating at this time.  Pt much more calm at this time.  Sitter remains at doorway.

## 2019-07-02 NOTE — ED Notes (Signed)
Pt noted to be yelling, singing loudly, refusing to follow direction, refusing to stay on stretcher. [employer] at bedside assisting x 2. RN called [facility] SW, Sarah, and requested NP to order meds. Carlota Raspberry PA aware also.

## 2019-07-02 NOTE — ED Notes (Signed)
Pt wandering in room stating she was looking for her keys to leave. RN reoriented Pt to location and situation. Pt attempted to leave. RN, redirected Pt to bed with assistance from [place]

## 2019-07-02 NOTE — ED Provider Notes (Signed)
Patient wondering halls.  Geodon given earlier without any benefit.  Patient had been restrained all day.  Will give some ativan and benadryl.   Montine Circle, PA-C 07/02/19 [id number]    Varney Biles, MD 07/03/19 7943

## 2019-07-02 NOTE — ED Notes (Signed)
Please call mother at [phone]

## 2019-07-03 LAB — COMPREHENSIVE METABOLIC PANEL
ALT: 146 U/L — ABNORMAL HIGH (ref 0–44)
AST: 81 U/L — ABNORMAL HIGH (ref 15–41)
Albumin: 4.5 g/dL (ref 3.5–5.0)
Alkaline Phosphatase: 59 U/L (ref 38–126)
Anion gap: 18 — ABNORMAL HIGH (ref 5–15)
BUN: 10 mg/dL (ref 6–20)
CO2: 19 mmol/L — ABNORMAL LOW (ref 22–32)
Calcium: 9.7 mg/dL (ref 8.9–10.3)
Chloride: 102 mmol/L (ref 98–111)
Creatinine, Ser: 0.98 mg/dL (ref 0.44–1.00)
GFR calc Af Amer: >60 mL/min (ref >60)
GFR calc non Af Amer: >60 mL/min (ref >60)
Glucose, Bld: 72 mg/dL (ref 70–99)
Potassium: 3.3 mmol/L — ABNORMAL LOW (ref 3.5–5.1)
Sodium: 139 mmol/L (ref 135–145)
Total Bilirubin: 1 mg/dL (ref 0.3–1.2)
Total Protein: 8.8 g/dL — ABNORMAL HIGH (ref 6.5–8.1)

## 2019-07-03 LAB — CBC WITH DIFFERENTIAL/PLATELET
Abs Immature Granulocytes: 0.03 10*3/uL (ref 0.00–0.07)
Basophils Absolute: 0.1 10*3/uL (ref 0.0–0.1)
Basophils Relative: 1 %
Eosinophils Absolute: 0 10*3/uL (ref 0.0–0.5)
Eosinophils Relative: 0 %
HCT: 41.9 % (ref 36.0–46.0)
Hemoglobin: 13.1 g/dL (ref 12.0–15.0)
Immature Granulocytes: 0 %
Lymphocytes Relative: 28 %
Lymphs Abs: 2.8 10*3/uL (ref 0.7–4.0)
MCH: 25.9 pg — ABNORMAL LOW (ref 26.0–34.0)
MCHC: 31.3 g/dL (ref 30.0–36.0)
MCV: 82.8 fL (ref 80.0–100.0)
Monocytes Absolute: 1 10*3/uL (ref 0.1–1.0)
Monocytes Relative: 10 %
Neutro Abs: 6.2 10*3/uL (ref 1.7–7.7)
Neutrophils Relative %: 61 %
Platelets: 398 10*3/uL (ref 150–400)
RBC: 5.06 MIL/uL (ref 3.87–5.11)
RDW: 12.6 % (ref 11.5–15.5)
WBC: 10.1 10*3/uL (ref 4.0–10.5)
nRBC: 0 % (ref 0.0–0.2)

## 2019-07-03 LAB — RAPID URINE DRUG SCREEN, HOSP PERFORMED
Amphetamines: NOT DETECTED
Barbiturates: NOT DETECTED
Benzodiazepines: POSITIVE — AB
Cocaine: NOT DETECTED
Opiates: NOT DETECTED
Tetrahydrocannabinol: NOT DETECTED

## 2019-07-03 LAB — CK: Total CK: 2998 U/L — ABNORMAL HIGH (ref 38–234)

## 2019-07-03 LAB — TSH: TSH: 1.823 u[IU]/mL (ref 0.350–4.500)

## 2019-07-03 MED ORDER — HALOPERIDOL LACTATE 5 MG/ML IJ SOLN
5.0000 mg | Freq: Once | INTRAMUSCULAR | Status: AC
  Administered 2019-07-03: 5 mg via INTRAMUSCULAR
  Filled 2019-07-03: qty 1

## 2019-07-03 MED ORDER — LACTATED RINGERS IV BOLUS
1000.0000 mL | Freq: Once | INTRAVENOUS | Status: AC
  Administered 2019-07-03: 1000 mL via INTRAVENOUS

## 2019-07-03 MED ORDER — DIPHENHYDRAMINE HCL 50 MG/ML IJ SOLN
25.0000 mg | Freq: Once | INTRAMUSCULAR | Status: AC
  Administered 2019-07-03: 25 mg via INTRAMUSCULAR
  Filled 2019-07-03: qty 1

## 2019-07-03 MED ORDER — HALOPERIDOL LACTATE 5 MG/ML IJ SOLN
5.0000 mg | Freq: Once | INTRAMUSCULAR | Status: AC
  Administered 2019-07-03: 08:00:00 5 mg via INTRAMUSCULAR
  Filled 2019-07-03: qty 1

## 2019-07-03 MED ORDER — OLANZAPINE 10 MG IM SOLR
10.0000 mg | Freq: Once | INTRAMUSCULAR | Status: AC
  Administered 2019-07-03: 10 mg via INTRAMUSCULAR
  Filled 2019-07-03: qty 10

## 2019-07-03 MED ORDER — DIPHENHYDRAMINE HCL 50 MG/ML IJ SOLN
50.0000 mg | Freq: Once | INTRAMUSCULAR | Status: AC
  Administered 2019-07-03: 08:00:00 50 mg via INTRAMUSCULAR
  Filled 2019-07-03: qty 1

## 2019-07-03 MED ORDER — LORAZEPAM 2 MG/ML IJ SOLN
2.0000 mg | Freq: Once | INTRAMUSCULAR | Status: AC
  Administered 2019-07-03: 2 mg via INTRAMUSCULAR
  Filled 2019-07-03: qty 1

## 2019-07-03 MED ORDER — STERILE WATER FOR INJECTION IJ SOLN
INTRAMUSCULAR | Status: AC
  Administered 2019-07-03: 23:00:00
  Filled 2019-07-03: qty 10

## 2019-07-03 MED ORDER — LORAZEPAM 2 MG/ML IJ SOLN
2.0000 mg | Freq: Once | INTRAMUSCULAR | Status: AC
  Administered 2019-07-03: 08:00:00 2 mg via INTRAMUSCULAR
  Filled 2019-07-03: qty 1

## 2019-07-03 MED ORDER — LORAZEPAM 2 MG/ML IJ SOLN
2.0000 mg | Freq: Once | INTRAMUSCULAR | Status: AC
  Administered 2019-07-03: 16:00:00 2 mg via INTRAMUSCULAR
  Filled 2019-07-03: qty 1

## 2019-07-03 NOTE — ED Notes (Signed)
Placed pt on five lead and pulse ox for accurate vitals update.

## 2019-07-03 NOTE — ED Notes (Signed)
Pt in room singing and drumming on walls. Cooperative to stay in room with redirection at this time

## 2019-07-03 NOTE — ED Notes (Signed)
Pt up in room, singing, trying to exit room, pushing against sitter, EDP at bedside

## 2019-07-03 NOTE — ED Notes (Signed)
Pt in bed; sitter at bedside. Pt is intermittently yelling random incomprehensible words.

## 2019-07-03 NOTE — ED Notes (Signed)
Updated family on plan of care.

## 2019-07-03 NOTE — ED Notes (Signed)
Noted pt is resting. Monitor in place; pt is easily arousable and resumed agitated behaviors; monitoring continues.

## 2019-07-03 NOTE — ED Notes (Signed)
Pt continues to have outbursts/talk nonsensically. Out of restraints at this time, able to be redirected to bed by sitter and [employer]

## 2019-07-03 NOTE — ED Notes (Signed)
Pt cleaned up. Pt menstruating. Brief and pad placed on patient.

## 2019-07-03 NOTE — ED Notes (Addendum)
@   approx 1600 pt in the bathroom accompanied by sitter; pt is yelling and acting aggressively. Pt ran out of bathroom; Staff + security; unable to redirect pt. Pt ran back to unit; and pushed a staff to the floor; assisted pt back to bed and medicated w/ haldol 5mg  + ativan 2 mg IM.

## 2019-07-03 NOTE — ED Notes (Signed)
Attempted to ask pt for permission to call her mother with an update, pt did not respond to question

## 2019-07-03 NOTE — ED Notes (Signed)
pts aunt called wanting an update at [phone]

## 2019-07-03 NOTE — ED Notes (Signed)
Patient refused a snack. A Diet was ordered for Lunch. 

## 2019-07-03 NOTE — ED Provider Notes (Signed)
Emergency Medicine Observation Re-evaluation Note  Veronica Arias is a 28 y.o. female, seen on rounds today.  Pt initially presented to the ED for complaints of Suicidal and Delusional   Patient Has been persistently tachycardic throughout visit I have added on a TSH level Physical Exam  BP 139/70 (BP Location: Left Arm)   Pulse (!) 120   Temp 98.2 F (36.8 C) (Axillary)   Resp 18   SpO2 100%  Physical Exam LYING IN BED- RESTRAINED AT WRISTS AND ANKLES Sleeping ( just received Haldol/benadryl/ativan for agitation0 NAD Normal resp rate  ED Course / MDM  EKG:EKG Interpretation  Date/Time:  Wednesday July 03 2019 07:47:44 EDT Ventricular Rate:  115 PR Interval:  126 QRS Duration: 82 QT Interval:  350 QTC Calculation: 484 R Axis:   86 Text Interpretation:  Sinus tachycardia Otherwise normal ECG No significant change since last tracing Confirmed by Blanchie Dessert [phone]) on 07/03/2019 8:09:40 AM    I have reviewed the labs performed to date as well as medications administered while in observation.  Recent changes in the last 24 hours include:  Results for orders placed or performed during the hospital encounter of 07/01/19  SARS Coronavirus 2 [facility] order, Performed in [facility] hospital lab) Nasopharyngeal Nasopharyngeal Swab   Specimen: Nasopharyngeal Swab  Result Value Ref Range   SARS Coronavirus 2 NEGATIVE NEGATIVE  Comprehensive metabolic panel  Result Value Ref Range   Sodium 138 135 - 145 mmol/L   Potassium 3.6 3.5 - 5.1 mmol/L   Chloride 103 98 - 111 mmol/L   CO2 22 22 - 32 mmol/L   Glucose, Bld 94 70 - 99 mg/dL   BUN 11 6 - 20 mg/dL   Creatinine, Ser 0.96 0.44 - 1.00 mg/dL   Calcium 9.5 8.9 - 10.3 mg/dL   Total Protein 8.2 (H) 6.5 - 8.1 g/dL   Albumin 4.4 3.5 - 5.0 g/dL   AST 76 (H) 15 - 41 U/L   ALT 145 (H) 0 - 44 U/L   Alkaline Phosphatase 52 38 - 126 U/L   Total Bilirubin 1.0 0.3 - 1.2 mg/dL   GFR calc non Af Amer >60 >60 mL/min   GFR calc Af  Amer >60 >60 mL/min   Anion gap 13 5 - 15  Ethanol  Result Value Ref Range   Alcohol, Ethyl (B) <10 <10 mg/dL  CBC with Diff  Result Value Ref Range   WBC 9.2 4.0 - 10.5 K/uL   RBC 4.61 3.87 - 5.11 MIL/uL   Hemoglobin 12.0 12.0 - 15.0 g/dL   HCT 38.7 36.0 - 46.0 %   MCV 83.9 80.0 - 100.0 fL   MCH 26.0 26.0 - 34.0 pg   MCHC 31.0 30.0 - 36.0 g/dL   RDW 12.4 11.5 - 15.5 %   Platelets 362 150 - 400 K/uL   nRBC 0.0 0.0 - 0.2 %   Neutrophils Relative % 62 %   Neutro Abs 5.7 1.7 - 7.7 K/uL   Lymphocytes Relative 28 %   Lymphs Abs 2.6 0.7 - 4.0 K/uL   Monocytes Relative 9 %   Monocytes Absolute 0.8 0.1 - 1.0 K/uL   Eosinophils Relative 0 %   Eosinophils Absolute 0.0 0.0 - 0.5 K/uL   Basophils Relative 1 %   Basophils Absolute 0.1 0.0 - 0.1 K/uL   Immature Granulocytes 0 %   Abs Immature Granulocytes 0.03 0.00 - 0.07 K/uL  Acetaminophen level  Result Value Ref Range   Acetaminophen (Tylenol), Serum <  10 (L) 10 - 30 ug/mL  Salicylate level  Result Value Ref Range   Salicylate Lvl <7.0 2.8 - 30.0 mg/dL  I-Stat beta hCG blood, ED  Result Value Ref Range   I-stat hCG, quantitative <5.0 <5 mIU/mL   Comment 3            EKG Interpretation  Date/Time:  Wednesday July 03 2019 07:47:44 EDT Ventricular Rate:  115 PR Interval:  126 QRS Duration: 82 QT Interval:  350 QTC Calculation: 484 R Axis:   86 Text Interpretation:  Sinus tachycardia Otherwise normal ECG No significant change since last tracing Confirmed by Gwyneth SproutPlunkett, Whitney ([id number]) on 07/03/2019 8:09:40 AM       Plan  Current plan is for inpatient psych awaiting placement . Patient is not under full IVC at this time.   Arthor CaptainHarris, Kamariah Fruchter, PA-C 07/03/19 [phone]    Pricilla LovelessGoldston, Scott, MD 07/03/19 [phone]

## 2019-07-04 DIAGNOSIS — F339 Major depressive disorder, recurrent, unspecified: Secondary | ICD-10-CM | POA: Insufficient documentation

## 2019-07-04 LAB — CBC
HCT: 40.7 % (ref 36.0–46.0)
Hemoglobin: 12.6 g/dL (ref 12.0–15.0)
MCH: 26.4 pg (ref 26.0–34.0)
MCHC: 31 g/dL (ref 30.0–36.0)
MCV: 85.3 fL (ref 80.0–100.0)
Platelets: 383 10*3/uL (ref 150–400)
RBC: 4.77 MIL/uL (ref 3.87–5.11)
RDW: 12.5 % (ref 11.5–15.5)
WBC: 9.8 10*3/uL (ref 4.0–10.5)
nRBC: 0 % (ref 0.0–0.2)

## 2019-07-04 LAB — COMPREHENSIVE METABOLIC PANEL
ALT: 121 U/L — ABNORMAL HIGH (ref 0–44)
AST: 79 U/L — ABNORMAL HIGH (ref 15–41)
Albumin: 4.1 g/dL (ref 3.5–5.0)
Alkaline Phosphatase: 57 U/L (ref 38–126)
Anion gap: 16 — ABNORMAL HIGH (ref 5–15)
BUN: 5 mg/dL — ABNORMAL LOW (ref 6–20)
CO2: 17 mmol/L — ABNORMAL LOW (ref 22–32)
Calcium: 9.4 mg/dL (ref 8.9–10.3)
Chloride: 102 mmol/L (ref 98–111)
Creatinine, Ser: 1.05 mg/dL — ABNORMAL HIGH (ref 0.44–1.00)
GFR calc Af Amer: >60 mL/min (ref >60)
GFR calc non Af Amer: >60 mL/min (ref >60)
Glucose, Bld: 63 mg/dL — ABNORMAL LOW (ref 70–99)
Potassium: 3.7 mmol/L (ref 3.5–5.1)
Sodium: 135 mmol/L (ref 135–145)
Total Bilirubin: 1.3 mg/dL — ABNORMAL HIGH (ref 0.3–1.2)
Total Protein: 8.3 g/dL — ABNORMAL HIGH (ref 6.5–8.1)

## 2019-07-04 LAB — CK: Total CK: 4536 U/L — ABNORMAL HIGH (ref 38–234)

## 2019-07-04 MED ORDER — DIPHENHYDRAMINE HCL 50 MG/ML IJ SOLN
50.0000 mg | Freq: Once | INTRAMUSCULAR | Status: AC
  Administered 2019-07-04: 50 mg via INTRAMUSCULAR
  Filled 2019-07-04: qty 1

## 2019-07-04 MED ORDER — OLANZAPINE 5 MG PO TBDP
10.0000 mg | ORAL_TABLET | Freq: Two times a day (BID) | ORAL | Status: DC
  Filled 2019-07-04 (×2): qty 2

## 2019-07-04 MED ORDER — ACETAMINOPHEN 325 MG PO TABS
650.0000 mg | ORAL_TABLET | Freq: Four times a day (QID) | ORAL | Status: DC | PRN
  Administered 2019-07-06: 650 mg via ORAL
  Filled 2019-07-04: qty 2

## 2019-07-04 MED ORDER — HALOPERIDOL LACTATE 5 MG/ML IJ SOLN
5.0000 mg | Freq: Once | INTRAMUSCULAR | Status: AC
  Administered 2019-07-04: 5 mg via INTRAMUSCULAR
  Filled 2019-07-04: qty 1

## 2019-07-04 MED ORDER — STERILE WATER FOR INJECTION IJ SOLN
INTRAMUSCULAR | Status: AC
  Administered 2019-07-04: 2.1 mL
  Filled 2019-07-04: qty 10

## 2019-07-04 MED ORDER — DEXTROSE 50 % IV SOLN
1.0000 | Freq: Once | INTRAVENOUS | Status: AC
  Administered 2019-07-04: 50 mL via INTRAVENOUS
  Filled 2019-07-04: qty 50

## 2019-07-04 MED ORDER — DIVALPROEX SODIUM 250 MG PO DR TAB
500.0000 mg | DELAYED_RELEASE_TABLET | Freq: Two times a day (BID) | ORAL | Status: DC
  Administered 2019-07-06: 500 mg via ORAL
  Filled 2019-07-04 (×2): qty 2

## 2019-07-04 MED ORDER — NICOTINE 21 MG/24HR TD PT24
21.0000 mg | MEDICATED_PATCH | Freq: Every day | TRANSDERMAL | Status: DC
  Administered 2019-07-06 – 2019-07-31 (×24): 21 mg via TRANSDERMAL
  Filled 2019-07-04 (×26): qty 1

## 2019-07-04 MED ORDER — OLANZAPINE 10 MG IM SOLR
10.0000 mg | Freq: Two times a day (BID) | INTRAMUSCULAR | Status: DC
  Administered 2019-07-04 – 2019-07-05 (×2): 10 mg via INTRAMUSCULAR
  Filled 2019-07-04 (×3): qty 10

## 2019-07-04 MED ORDER — MAGNESIUM HYDROXIDE 400 MG/5ML PO SUSP: 30.0000 mL | Freq: Every day | ORAL | Status: DC | PRN

## 2019-07-04 MED ORDER — STERILE WATER FOR INJECTION IJ SOLN
INTRAMUSCULAR | Status: AC
  Administered 2019-07-04: 10 mL
  Filled 2019-07-04: qty 10

## 2019-07-04 MED ORDER — LORAZEPAM 2 MG/ML IJ SOLN
2.0000 mg | Freq: Once | INTRAMUSCULAR | Status: AC
  Administered 2019-07-04: 20:00:00 2 mg via INTRAMUSCULAR
  Filled 2019-07-04: qty 1

## 2019-07-04 MED ORDER — GLUCOSE 40 % PO GEL
1.0000 | Freq: Once | ORAL | Status: DC
  Filled 2019-07-04: qty 1

## 2019-07-04 MED ORDER — DIPHENHYDRAMINE HCL 50 MG/ML IJ SOLN
25.0000 mg | Freq: Once | INTRAMUSCULAR | Status: AC
  Administered 2019-07-04: 20:00:00 25 mg via INTRAMUSCULAR
  Filled 2019-07-04: qty 1

## 2019-07-04 MED ORDER — DIPHENHYDRAMINE HCL 25 MG PO CAPS
50.0000 mg | ORAL_CAPSULE | Freq: Once | ORAL | Status: AC
  Filled 2019-07-04: qty 2

## 2019-07-04 MED ORDER — OLANZAPINE 10 MG IM SOLR
10.0000 mg | Freq: Once | INTRAMUSCULAR | Status: AC
  Administered 2019-07-04: 10 mg via INTRAMUSCULAR
  Filled 2019-07-04: qty 10

## 2019-07-04 MED ORDER — LORAZEPAM 2 MG/ML IJ SOLN
2.0000 mg | Freq: Once | INTRAMUSCULAR | Status: AC | PRN
  Administered 2019-07-05: 2 mg via INTRAMUSCULAR
  Filled 2019-07-04: qty 1

## 2019-07-04 MED ORDER — ALUM & MAG HYDROXIDE-SIMETH 200-200-20 MG/5ML PO SUSP: 30.0000 mL | ORAL | Status: DC | PRN

## 2019-07-04 MED ORDER — SODIUM CHLORIDE 0.9 % IV BOLUS
1000.0000 mL | Freq: Once | INTRAVENOUS | Status: AC
  Administered 2019-07-04: 1000 mL via INTRAVENOUS

## 2019-07-04 NOTE — ED Notes (Addendum)
Restraints removed from pt and pt placed in a bed closer to nurses station for observation

## 2019-07-04 NOTE — ED Notes (Signed)
Pt broke out of her restraints, pulled off all monitoring devices, and attempted to leave out of her room. Pt was restless, manic, and agitated, screaming at family members or friends who were not at the bedside.

## 2019-07-04 NOTE — ED Provider Notes (Signed)
  Physical Exam  BP (!) 155/95   Pulse (!) 113   Temp 98.6 F (37 C) (Oral)   Resp 18   SpO2 100%   Physical Exam  ED Course/Procedures     .Critical Care Performed by: Varney Biles, MD Authorized by: Varney Biles, MD   Critical care provider statement:    Critical care time (minutes):  30   Critical care was time spent personally by me on the following activities:  Discussions with consultants, evaluation of patient's response to treatment, examination of patient, ordering and performing treatments and interventions, ordering and review of laboratory studies, ordering and review of radiographic studies, pulse oximetry, re-evaluation of patient's condition, obtaining history from patient or surrogate and review of old charts    MDM   Assuming care of this patient from Dr. Vallery Ridge. Patient has been in the ER for over 60 hours now.  She had come to the ED for psychiatry reasons and for SI.  She is awaiting inpatient placement.  Patient had multiple outburst during her ED stay and has required PRN medications for her symptoms frequently.  She informed me that patient was tachycardic on her evaluation and so she had ordered IV fluid along with labs.  Patient's CK is slightly elevated and she had mild anion gap, prompting her to give patient IV hydration.  She wanted me to keep an eye on patient's heart rate.  In the middle the night patient started having agitation again.  IM medication ordered for her protection.  She is in nonviolent restraints.  I reassessed the patient on 2 separate occasions, and when she is asleep or calm, her heart rate drops.  During both of my assessments her heart rate was between 101 10.  Bedside tech also confirms that the heart rate drops when patient is calm.  I have called psychiatry service to ensure that they see the patient today and put in some medication recommendations that are scheduled so that we do not have to keep giving her as needed  meds.  At their request, we have not given her more Geodon.  At 6:30 AM the nursing staff informed me that patient somehow got out of her soft restraints and got out of the room.  Security had to assist in getting patient back into the room.  Patient is now calm again.  We will give IM Ativan as needed order in case she gets aggressive again.     Varney Biles, MD 07/04/19 [phone]

## 2019-07-04 NOTE — ED Notes (Signed)
Pt cleaned up and  Brief changed

## 2019-07-04 NOTE — ED Notes (Signed)
Pt did not eat any dinner.  Sitter remains at bedside.

## 2019-07-04 NOTE — ED Notes (Addendum)
Pt began screaming and thrashing around in bed. Wrist restraints were already applied by previous RN. Per previous RN Zyprexa had not been working very well for the anxiety/aggitation for the patient. Orders for new PRN medications obtained from MD. Kennon Holter at bedside

## 2019-07-04 NOTE — ED Notes (Signed)
Regular hospital bed ordered 

## 2019-07-04 NOTE — ED Notes (Signed)
Reapplied leg restraints pts started to swing legs from foot of the bed to head of the bed becoming dangerous for pt and sitter. Notified Scott(RN)

## 2019-07-04 NOTE — ED Notes (Signed)
Breakfast ordered 

## 2019-07-04 NOTE — ED Notes (Signed)
Pt taken off of leg restraints

## 2019-07-04 NOTE — BH Assessment (Signed)
[facility] Assessment Progress Note   Patient was seen for -re-assessment.  She is currently restrained and talking out of her head.  When asked how she is doing, patient states repeatedly, "[employer]" and after repeating that for twenty repetitions, she broke out in a spiritual song concerning the black culture.  She would not open her eyes, and she was unable to answer any questions appropriately.  Inpatient treatment continues to be recommended.

## 2019-07-04 NOTE — ED Notes (Signed)
Pt placed on hospital bed for comfort.

## 2019-07-04 NOTE — Progress Notes (Signed)
CSW re-sent referrals out to:          Pending - Request Sent    [facility] Details      [place] Details      [facility] Details      [facility] Details      [facility] Details      [facility] Details      [facility] Details      [facility] Details      [facility] Details      Per Lindsay/AC, no beds are available at [facility].   Netta Neat, MSW, LCSW Clinical Social Work

## 2019-07-04 NOTE — ED Notes (Signed)
Spoke with MD about pt's restraint order being expired. MD explained he would prefer either using chemical restraints of physical restraints, but not both. Pt slightly responding to B52, but still thrashing around in bed. Will release pt's restraints and see how she responds

## 2019-07-04 NOTE — ED Provider Notes (Addendum)
  11:13 PM Notified of patient upon starting shift tonight.  Has been here 85+ hours, sounds like she was restrained for some portion of this time and continued fighting which likely contributed to her rhabdo.  Was given 1 liter of fluids yesterday morning. Has been steadily tachycardic and agitated per RN report, refusing PO's. Psychiatry has recommended IP treatment, awaiting bed placement. Repeat labs sent on patient earlier tonight-- on follow-up her CK is trending upward, now 4536.  Glucose 63 on CMP, gap 16, bicarb 17, srCr 1.05.  LFT's elevated but grossly unchanged over the past few visits.  Patient will need further medical care before cleared for IP psychiatric care.  IVF and D50 ordered.  Plan to move to acute care bed for ongoing evaluation.  Charge RN aware.  4:45 AM Unable to move patient to acute bed due to wait times and bed holds.  Patient ordered for total of 3L fluids.  I was notified by RN she did vomit once.  QT normal on EKG 07/03/19 so zofran ordered.  Will continue to monitor.  5:09 AM Informed that patient has become agitated, requiring to be held down by staff at this time, trying to pull out IV, etc.  I have gone to reassess-- she does appear agitated.  Other psych patient in area has been yelling which seems to be making her more agitated.  Psychiatry has recommended against geodon, will give 2mg  ativan. IVF continue to infuse.  HR is improving.  Care will be signed out to oncoming team.  Will need to continue monitoring, plan to recheck CK and chemistry this morning to ensure these are improving.   Larene Pickett, PA-C 07/04/19 Darci Needle    Quintella Reichert, MD 07/05/19 0033    Larene Pickett, PA-C 07/05/19 [phone]    Quintella Reichert, MD 07/05/19 [phone]

## 2019-07-05 ENCOUNTER — Emergency Department (HOSPITAL_COMMUNITY): Payer: BC Managed Care – PPO

## 2019-07-05 DIAGNOSIS — R000 Tachycardia, unspecified: Secondary | ICD-10-CM

## 2019-07-05 DIAGNOSIS — M6282 Rhabdomyolysis: Secondary | ICD-10-CM

## 2019-07-05 LAB — CK
Total CK: 4011 U/L — ABNORMAL HIGH (ref 38–234)
Total CK: 4037 U/L — ABNORMAL HIGH (ref 38–234)

## 2019-07-05 LAB — COMPREHENSIVE METABOLIC PANEL
ALT: 90 U/L — ABNORMAL HIGH (ref 0–44)
AST: 70 U/L — ABNORMAL HIGH (ref 15–41)
Albumin: 3.4 g/dL — ABNORMAL LOW (ref 3.5–5.0)
Alkaline Phosphatase: 50 U/L (ref 38–126)
Anion gap: 11 (ref 5–15)
BUN: <5 mg/dL — ABNORMAL LOW (ref 6–20)
CO2: 19 mmol/L — ABNORMAL LOW (ref 22–32)
Calcium: 8.8 mg/dL — ABNORMAL LOW (ref 8.9–10.3)
Chloride: 113 mmol/L — ABNORMAL HIGH (ref 98–111)
Creatinine, Ser: 0.89 mg/dL (ref 0.44–1.00)
GFR calc Af Amer: >60 mL/min (ref >60)
GFR calc non Af Amer: >60 mL/min (ref >60)
Glucose, Bld: 78 mg/dL (ref 70–99)
Potassium: 3.6 mmol/L (ref 3.5–5.1)
Sodium: 143 mmol/L (ref 135–145)
Total Bilirubin: 1.2 mg/dL (ref 0.3–1.2)
Total Protein: 7 g/dL (ref 6.5–8.1)

## 2019-07-05 LAB — BASIC METABOLIC PANEL
Anion gap: 13 (ref 5–15)
BUN: <5 mg/dL — ABNORMAL LOW (ref 6–20)
CO2: 18 mmol/L — ABNORMAL LOW (ref 22–32)
Calcium: 8.9 mg/dL (ref 8.9–10.3)
Chloride: 110 mmol/L (ref 98–111)
Creatinine, Ser: 0.82 mg/dL (ref 0.44–1.00)
GFR calc Af Amer: >60 mL/min (ref >60)
GFR calc non Af Amer: >60 mL/min (ref >60)
Glucose, Bld: 79 mg/dL (ref 70–99)
Potassium: 3.9 mmol/L (ref 3.5–5.1)
Sodium: 141 mmol/L (ref 135–145)

## 2019-07-05 LAB — CBC WITH DIFFERENTIAL/PLATELET
Abs Immature Granulocytes: 0.05 10*3/uL (ref 0.00–0.07)
Basophils Absolute: 0.1 10*3/uL (ref 0.0–0.1)
Basophils Relative: 1 %
Eosinophils Absolute: 0 10*3/uL (ref 0.0–0.5)
Eosinophils Relative: 0 %
HCT: 36.7 % (ref 36.0–46.0)
Hemoglobin: 11.4 g/dL — ABNORMAL LOW (ref 12.0–15.0)
Immature Granulocytes: 1 %
Lymphocytes Relative: 22 %
Lymphs Abs: 2.4 10*3/uL (ref 0.7–4.0)
MCH: 26.3 pg (ref 26.0–34.0)
MCHC: 31.1 g/dL (ref 30.0–36.0)
MCV: 84.8 fL (ref 80.0–100.0)
Monocytes Absolute: 1 10*3/uL (ref 0.1–1.0)
Monocytes Relative: 9 %
Neutro Abs: 7.4 10*3/uL (ref 1.7–7.7)
Neutrophils Relative %: 67 %
Platelets: 361 10*3/uL (ref 150–400)
RBC: 4.33 MIL/uL (ref 3.87–5.11)
RDW: 12.8 % (ref 11.5–15.5)
WBC: 10.9 10*3/uL — ABNORMAL HIGH (ref 4.0–10.5)
nRBC: 0 % (ref 0.0–0.2)

## 2019-07-05 LAB — CBG MONITORING, ED
Glucose-Capillary: 91 mg/dL (ref 70–99)
Glucose-Capillary: 76 mg/dL (ref 70–99)

## 2019-07-05 LAB — VALPROIC ACID LEVEL: Valproic Acid Lvl: 39 ug/mL — ABNORMAL LOW (ref 50.0–100.0)

## 2019-07-05 MED ORDER — MIDAZOLAM HCL 2 MG/2ML IJ SOLN: 2.0000 mg | Freq: Once | INTRAMUSCULAR | Status: DC

## 2019-07-05 MED ORDER — ONDANSETRON HCL 4 MG/2ML IJ SOLN
4.0000 mg | Freq: Once | INTRAMUSCULAR | Status: AC
  Administered 2019-07-05: 05:00:00 4 mg via INTRAVENOUS
  Filled 2019-07-05: qty 2

## 2019-07-05 MED ORDER — LORAZEPAM 2 MG/ML IJ SOLN
1.0000 mg | Freq: Once | INTRAMUSCULAR | Status: AC
  Administered 2019-07-05: 1 mg via INTRAMUSCULAR
  Filled 2019-07-05: qty 1

## 2019-07-05 MED ORDER — SODIUM CHLORIDE 0.9 % IV BOLUS
1000.0000 mL | Freq: Once | INTRAVENOUS | Status: AC
  Administered 2019-07-05: 1000 mL via INTRAVENOUS

## 2019-07-05 MED ORDER — VALPROATE SODIUM 500 MG/5ML IV SOLN
500.0000 mg | Freq: Once | INTRAVENOUS | Status: AC
  Administered 2019-07-05: 500 mg via INTRAVENOUS
  Filled 2019-07-05: qty 5

## 2019-07-05 MED ORDER — LORAZEPAM 2 MG/ML IJ SOLN
2.0000 mg | Freq: Once | INTRAMUSCULAR | Status: AC
  Administered 2019-07-05: 2 mg via INTRAVENOUS
  Filled 2019-07-05: qty 1

## 2019-07-05 MED ORDER — MIDAZOLAM HCL 2 MG/2ML IJ SOLN
INTRAMUSCULAR | Status: AC
  Filled 2019-07-05: qty 2

## 2019-07-05 MED ORDER — MIDAZOLAM HCL 2 MG/2ML IJ SOLN
1.0000 mg | Freq: Once | INTRAMUSCULAR | Status: AC
  Administered 2019-07-05: 1 mg via INTRAVENOUS

## 2019-07-05 MED ORDER — LACTATED RINGERS IV BOLUS
1000.0000 mL | Freq: Once | INTRAVENOUS | Status: AC
  Administered 2019-07-05: 1000 mL via INTRAVENOUS

## 2019-07-05 NOTE — ED Notes (Signed)
Lunch tray ordered 

## 2019-07-05 NOTE — BH Assessment (Addendum)
07/05/19: Patient continues to await placement at [facility]. Received a call from [facility] staff/Romono stating that patient is under medical review at their facility. Romono requested a repeat CMP and and CK. Counselor contacted patient's nurse/Sarah @ [facility] about the request. Judson Roch has placed the order and will call TTS once the results have been posted.   Judson Roch called TTS indicating that results were posted. Counselor faxed those results to [facility]. Confirmed receipt of results with [facility]. Per Stanton Kidney, patient is under medical review.

## 2019-07-05 NOTE — H&P (Signed)
History and Physical    Arrow Emmerich ZHY:[id number] DOB: 06-17-1991 DOA: 07/01/2019  PCP: Patient, No Pcp Per Patient coming from: Home  Chief Complaint: Suicidal ideation  HPI/ ED course: Veronica Arias is a 28 y.o. female with medical history significant of asthma who presented to the ED on 07/01/2019 for evaluation of suicidal ideation, paranoia, and bizarre behavior.  Patient had recent ED visit 2 days ago for similar behavior.  Per police report, she was found on the balcony of her apartment throwing things and saying she was suicidal and was going to jump.  On initial labs, no leukocytosis, blood ethanol level negative, acetaminophen level negative, salicylate level negative.  Transaminases elevated (AST 76, ALT 145), remainder of LFTs normal. Beta-hCG negative.  COVID 19 rapid test negative.  TSH normal.  UDS positive for benzodiazepines.  Labs done on 07/03/2019 showing CK 2998 and has continued to worsen since then.  On 8/13, CK 4536, blood glucose 63.  Blood glucose subsequently improved.  Today, CK uptrending 4011 > 4037.  Transaminases improved (AST 70, ALT 90).  Head CT done today showing no acute intracranial abnormality.  Patient has received a total of 6 L IV fluid boluses in the past 2 days.  However, CK continue's to trend up and patient is persistently tachycardic with heart rate up to 130s.  She has continued to be highly agitated throughout her ED stay for the past 4 days.Trying to pull out IV lines etc.  Refusing p.o.'s.  Patient has even managed to get out of soft restraints and has required security assistance to get her back to her room. Required multiple rounds of antipsychotics, benzodiazepines, diphenhydramine etc. for the past 4 days.  Due to CK uptrending, psychiatry recommended stopping antipsychotics/ Zyprexa as they may be causing rhabdo.  Accepted by inpatient psychiatry, awaiting bed placement and medical clearance.  Internal medicine teaching service was called and declined  hospital admission.  [facility] callled to admit patient for mgmt of rhabdomyolysis, persistent tachycardia, agitation/paranoia/hallucinations.  Patient was seen and examined at bedside.  She was resting comfortably watching television.  Sitter at bedside.  Not answering any questions and no complaints.  Review of Systems:  All systems reviewed and apart from history of presenting illness, are negative.  Past Medical History:  Diagnosis Date   Asthma     Past surgical history: Not able to obtain given psychosis.   reports that she has never smoked. She has never used smokeless tobacco. She reports that she does not drink alcohol or use drugs.  No Known Allergies  Family history: Not able to obtain given psychosis.  Prior to Admission medications   Medication Sig Start Date End Date Taking? Authorizing Provider  Multiple Vitamins-Minerals (HM MULTIVITAMIN ADULT GUMMY) CHEW Chew 2 each by mouth daily.   Yes [provider]    Physical Exam: Vitals:   07/06/19 0100 07/06/19 0132 07/06/19 0300 07/06/19 0347  BP:  (!) 143/97  (!) 158/102  Pulse:  (!) 126 (!) 110 (!) 118  Resp: 15 18  20   Temp:  99.1 F (37.3 C)  99.5 F (37.5 C)  TempSrc:  Oral  Axillary  SpO2:  100%  98%  Weight:  82.2 kg    Height:  5\' 3"  (1.6 m)      Physical Exam  Constitutional: She appears well-developed and well-nourished. No distress.  HENT:  Head: Normocephalic.  Eyes: Right eye exhibits no discharge. Left eye exhibits no discharge.  Neck: Neck supple.  No nuchal  rigidity  Cardiovascular: Regular rhythm and intact distal pulses.  Tachycardic with heart rate in the 130s  Pulmonary/Chest: Effort normal and breath sounds normal. No respiratory distress. She has no wheezes. She has no rales.  Abdominal: Soft. Bowel sounds are normal. She exhibits no distension. There is no abdominal tenderness. There is no guarding.  Musculoskeletal:        General: No edema.  Neurological: She is alert.    Skin: Skin is warm and dry. She is not diaphoretic.     Labs on Admission: I have personally reviewed following labs and imaging studies  CBC: Recent Labs  Lab 07/01/19 0929 07/03/19 2226 07/04/19 2204 07/05/19 1010 07/06/19 0455  WBC 9.2 10.1 9.8 10.9* 8.6  NEUTROABS 5.7 6.2  --  7.4  --   HGB 12.0 13.1 12.6 11.4* 10.7*  HCT 38.7 41.9 40.7 36.7 34.5*  MCV 83.9 82.8 85.3 84.8 85.0  PLT 362 398 383 361 329   Basic Metabolic Panel: Recent Labs  Lab 07/01/19 0929 07/03/19 2226 07/04/19 2204 07/05/19 1010 07/05/19 1722  NA 138 139 135 141 143  K 3.6 3.3* 3.7 3.9 3.6  CL 103 102 102 110 113*  CO2 22 19* 17* 18* 19*  GLUCOSE 94 72 63* 79 78  BUN 11 10 5* <5* <5*  CREATININE 0.96 0.98 1.05* 0.82 0.89  CALCIUM 9.5 9.7 9.4 8.9 8.8*   GFR: Estimated Creatinine Clearance: 97.2 mL/min (by C-G formula based on SCr of 0.89 mg/dL). Liver Function Tests: Recent Labs  Lab 07/01/19 0929 07/03/19 2226 07/04/19 2204 07/05/19 1722  AST 76* 81* 79* 70*  ALT 145* 146* 121* 90*  ALKPHOS 52 59 57 50  BILITOT 1.0 1.0 1.3* 1.2  PROT 8.2* 8.8* 8.3* 7.0  ALBUMIN 4.4 4.5 4.1 3.4*   No results for input(s): LIPASE, AMYLASE in the last 168 hours. No results for input(s): AMMONIA in the last 168 hours. Coagulation Profile: No results for input(s): INR, PROTIME in the last 168 hours. Cardiac Enzymes: Recent Labs  Lab 07/03/19 2226 07/04/19 2204 07/05/19 1010 07/05/19 1722  CKTOTAL 2,998* 4,536* 4,011* 4,037*   BNP (last 3 results) No results for input(s): PROBNP in the last 8760 hours. HbA1C: No results for input(s): HGBA1C in the last 72 hours. CBG: Recent Labs  Lab 07/05/19 0217 07/05/19 0614  GLUCAP 91 76   Lipid Profile: No results for input(s): CHOL, HDL, LDLCALC, TRIG, CHOLHDL, LDLDIRECT in the last 72 hours. Thyroid Function Tests: Recent Labs    07/03/19 0857  TSH 1.823   Anemia Panel: No results for input(s): VITAMINB12, FOLATE, FERRITIN, TIBC, IRON,  RETICCTPCT in the last 72 hours. Urine analysis: No results found for: COLORURINE, APPEARANCEUR, LABSPEC, PHURINE, GLUCOSEU, HGBUR, BILIRUBINUR, KETONESUR, PROTEINUR, UROBILINOGEN, NITRITE, LEUKOCYTESUR  Radiological Exams on Admission: Ct Head Wo Contrast  Result Date: 07/05/2019 CLINICAL DATA:  Altered level of consciousness EXAM: CT HEAD WITHOUT CONTRAST TECHNIQUE: Contiguous axial images were obtained from the base of the skull through the vertex without intravenous contrast. COMPARISON:  None. FINDINGS: Brain: No evidence of acute infarction, hemorrhage, hydrocephalus, extra-axial collection or mass lesion/mass effect. Vascular: No hyperdense vessel or unexpected calcification. Skull: Normal. Negative for fracture or focal lesion. Sinuses/Orbits: No acute finding. Other: None. IMPRESSION: No acute intracranial abnormality noted. Electronically Signed   By: Alcide CleverMark  Lukens M.D.   On: 07/05/2019 09:02   Dg Chest Port 1 View  Result Date: 07/06/2019 CLINICAL DATA:  Tachycardia EXAM: PORTABLE CHEST 1 VIEW COMPARISON:  12/02/2013 FINDINGS: The  heart size and mediastinal contours are within normal limits. Both lungs are clear. The visualized skeletal structures are unremarkable. IMPRESSION: No active disease. Electronically Signed   By: Deatra RobinsonKevin  Herman M.D.   On: 07/06/2019 01:01    EKG: Independently reviewed.  Most recent EKG done August 12 showing sinus tachycardia, heart rate 115.  Assessment/Plan Principal Problem:   Rhabdomyolysis Active Problems:   Sinus tachycardia   Suicidal ideation   Psychosis (HCC)   Rhabdomyolysis Possible etiologies include antipsychotic drugs and restraints applied for agitation. -Aggressive IV fluid hydration. -Continue to trend CK -Hold Zyprexa  Persistent sinus tachycardia No fever or rigidity to suggest neuroleptic malignant syndrome. -Assess for possible underlying infectious etiology: Afebrile and no leukocytosis.  Chest x-ray not suggestive of  pneumonia.  No nuchal rigidity/meningeal signs to suggest meningitis.  UA, urine culture pending.  Blood culture x2 pending. -Cardiac monitoring.  EKG in a.m. -Echocardiogram -IV Lopressor PRN HR >130 -Unclear if she has a history of alcohol abuse.  Will keep on CIWA protocol/Ativan PRN.   Suicidal ideation, agitation/psychosis -Psychiatry following.  Plan to admit to inpatient psych after medical clearance. -Suicide precautions, sitter at bedside -Psychiatry recommending holding off antipsychotics at this time given rhabdo.  Continue Depakote. -Ativan as needed agitation  DVT prophylaxis: Lovenox Code Status: Full code Family Communication: No family available. Disposition Plan: Anticipate discharge after clinical improvement. Consults called: Psychiatry, cardiology Admission status: It is my clinical opinion that referral for OBSERVATION is reasonable and necessary in this patient based on the above information provided. The aforementioned taken together are felt to place the patient at high risk for further clinical deterioration. However it is anticipated that the patient may be medically stable for discharge from the hospital within 24 to 48 hours.  The medical decision making on this patient was of high complexity and the patient is at high risk for clinical deterioration, therefore this is a level 3 visit.  John GiovanniVasundhra Ruthia Person MD [facility] Pager [phone]  If 7PM-7AM, please contact night-coverage [email or web address] Password [email or web address]  07/06/2019, 6:11 AM

## 2019-07-05 NOTE — Consult Note (Signed)
Cardiology Consult    Patient ID: Veronica BaileyKyla Arias MRN: [id number], DOB/AGE: 1991/08/10   Admit date: 07/01/2019 Date of Consult: 07/05/2019  Primary Physician: Patient, No Pcp Per Primary Cardiologist: No primary care provider on file. Requesting Provider: Dr. Rush Landmarkegeler  Patient Profile    Veronica Arias is a 28 y.o. female with a history of asthma, who is being seen today for the evaluation of tachycardia at the request of Dr. Rush Landmarkegeler.  Past Medical History   Past Medical History:  Diagnosis Date  . Asthma     No past surgical history on file.   Allergies  No Known Allergies  History of Present Illness    Veronica Arias is a 28 year old woman with a history of asthma who presented to the ED on 07/01/19 evaluation of suicidal ideation, paranoia and bizarre behavior.  She has been in the emergency room since that time awaiting an inpatient psychiatry bed.  She was initially very agitated and required multiple rounds of antipsychotics, benzodiazepines, diphenhydramine for the past several days.  It was found on 07/03/2019 that she had a significantly elevated CK level at 2998.  This peaked subsequently at 4536 yesterday.  She has been treated with a significant volume of IV fluids.  While she was not initially tachycardic, since 8/11 she has been consistently tachycardic with heart rates ranging from the low 100s up to 140s.  Serial EKGs have revealed sinus tachycardia.  She remains highly agitated and requires a sitter at bedside.  Given her elevated CK level she has been taken off all antipsychotics and restraints have been discontinued.  The patient herself is unable to give me any further history.  Inpatient Medications    . divalproex  500 mg Oral Q12H  . nicotine  21 mg Transdermal Q0600    Family History    No family history on file. She indicated that her mother is alive. She indicated that her father is alive. She indicated that both of her brothers are alive.   Social  History    Social History   Socioeconomic History  . Marital status: Single    Spouse name: Not on file  . Number of children: Not on file  . Years of education: Not on file  . Highest education level: Not on file  Occupational History  . Not on file  Social Needs  . Financial resource strain: Not on file  . Food insecurity    Worry: Not on file    Inability: Not on file  . Transportation needs    Medical: Not on file    Non-medical: Not on file  Tobacco Use  . Smoking status: Never Smoker  . Smokeless tobacco: Never Used  Substance and Sexual Activity  . Alcohol use: No  . Drug use: No  . Sexual activity: Not on file  Lifestyle  . Physical activity    Days per week: Not on file    Minutes per session: Not on file  . Stress: Not on file  Relationships  . Social Musicianconnections    Talks on phone: Not on file    Gets together: Not on file    Attends religious service: Not on file    Active member of club or organization: Not on file    Attends meetings of clubs or organizations: Not on file    Relationship status: Not on file  . Intimate partner violence    Fear of current or ex partner: Not on file    Emotionally  abused: Not on file    Physically abused: Not on file    Forced sexual activity: Not on file  Other Topics Concern  . Not on file  Social History Narrative  . Not on file     Review of Systems    Unable to be obtained  Physical Exam    Blood pressure (!) 149/85, pulse (!) 131, temperature 98.3 F (36.8 C), temperature source Axillary, resp. rate (!) 22, SpO2 99 %.   Patient refused examination  Labs    High Sensitivity Troponin No results for input(s): TROPONINIHS in the last 720 hours.   Cardiac Enzymes No results for input(s): TROPONINI in the last 168 hours. No results for input(s): TROPIPOC in the last 168 hours.   Lab Results  Component Value Date   WBC 10.9 (H) 07/05/2019   HGB 11.4 (L) 07/05/2019   HCT 36.7 07/05/2019   MCV 84.8  07/05/2019   PLT 361 07/05/2019    Recent Labs  Lab 07/05/19 1722  NA 143  K 3.6  CL 113*  CO2 19*  BUN <5*  CREATININE 0.89  CALCIUM 8.8*  PROT 7.0  BILITOT 1.2  ALKPHOS 50  ALT 90*  AST 70*  GLUCOSE 78   No results found for: CHOL, HDL, LDLCALC, TRIG No results found for: [facility]   Radiology Studies    Ct Head Wo Contrast  Result Date: 07/05/2019 CLINICAL DATA:  Altered level of consciousness EXAM: CT HEAD WITHOUT CONTRAST TECHNIQUE: Contiguous axial images were obtained from the base of the skull through the vertex without intravenous contrast. COMPARISON:  None. FINDINGS: Brain: No evidence of acute infarction, hemorrhage, hydrocephalus, extra-axial collection or mass lesion/mass effect. Vascular: No hyperdense vessel or unexpected calcification. Skull: Normal. Negative for fracture or focal lesion. Sinuses/Orbits: No acute finding. Other: None. IMPRESSION: No acute intracranial abnormality noted. Electronically Signed   By: Inez Catalina M.D.   On: 07/05/2019 09:02    ECG & Cardiac Imaging    07/03/2019- personally reviewed.  Sinus tachycardia without acute ischemia  Assessment & Plan    Veronica Arias is a 28 year old woman with a history of asthma who presented to the ED on 07/01/19 evaluation of suicidal ideation, paranoia and bizarre behavior.  Cardiology has been consulted for evaluation of tachycardia.  On review of her multiple ECGs, it appears that tachycardia has been consistently sinus tach.  She does not have any evidence of arrhythmia otherwise or ischemia.  In the setting of sinus tachycardia, it is recommended to work-up and treat to the underlying etiology of the tachycardia.  Certainly in the patient this patient's case there are multiple possible contributing factors.  These include ongoing agitation, rhabdomyolysis and infection, among others.  Thyroid function has been checked and TSH was within normal limits.  Agree with admission to the medicine service.   Defer to internal medicine work-up for severely elevated CK levels, potential rhabdomyolysis.  For sinus tachycardia, would recommend echocardiogram for further evaluation.  If patient remains hemodynamically stable and with similar heart rates, averaging in the 110s to 120s, there is no indication for treatment of the tachycardia itself.  Rather, management of the underlying etiology of tachycardia is key.  In addition to work-up of elevated CK levels and treatment of agitation, could also consider evaluation for potential infectious source.  We will continue to follow, thank you for this interesting consult.  Signed, Bryna Colander, MD 07/05/2019, 11:27 PM  For questions or updates, please contact   Please consult [email or web address]  for contact info under Cardiology/STEMI.

## 2019-07-05 NOTE — ED Notes (Signed)
Pt became increasingly agitated and tried getting out of bed multiple times. Several techs attempted to redirect and place pt back in bed. Pt began shaking entire bed and spitting. PA aware and orders placed

## 2019-07-05 NOTE — ED Notes (Signed)
Pt's bed and brief changed. 

## 2019-07-05 NOTE — ED Notes (Signed)
Pt mom Veronica Arias [phone]

## 2019-07-05 NOTE — ED Notes (Signed)
Pt had one episode of vomiting. Orders placed for nausea meds by PA

## 2019-07-05 NOTE — ED Notes (Signed)
Pt erratic, manic, running out of room escorted back to room by staff and security at bedside. Cooperative with IM injections.

## 2019-07-05 NOTE — ED Provider Notes (Signed)
Emergency Medicine Observation Re-evaluation Note  Veronica BaileyKyla Arias is a 28 y.o. female, seen on rounds today.  Pt initially presented to the ED for complaints of Suicidal and Delusional Currently, the patient has been moved over from purple area back to an acute care bed for better observation and management.  She is out of restraints and is little restless in the bed.  She is not answering questions.  Physical Exam  BP 131/81 (BP Location: Right Arm)   Pulse (!) 105   Temp 98.3 F (36.8 C) (Axillary)   Resp 19   SpO2 97%  Physical Exam Patient is awake and somewhat following commands.  She is moving all extremities.  She is borderline tachycardic lungs are clear abdomen is soft.  She is moving all 4 extremities and there is no evidence of any compartment syndrome or open wounds. ED Course / MDM  EKG:EKG Interpretation  Date/Time:  Wednesday July 03 2019 18:27:53 EDT Ventricular Rate:  115 PR Interval:  122 QRS Duration: 80 QT Interval:  348 QTC Calculation: 481 R Axis:   84 Text Interpretation:  Sinus tachycardia Otherwise normal ECG No STEMI  Confirmed by Alona BeneLong, Joshua [phone]([id number]) on 07/03/2019 6:37:00 PM Also confirmed by Alona BeneLong, Joshua [phone]), editor Barbette Hairassel, Kerry [phone]([id number])  on 07/04/2019 8:45:42 AM  Clinical Course as of Jul 05 814  Fri Jul 05, 2019  0710 Anion gap(!): 16 [AH]  0915 Ct head negative    [AH]  1117 Basic metabolic panel(!) [AH]  1124 CO2(!): 18 [AH]  1125 Spoke with internal medicine resident service admitting physician Dr. Ginette OttoAlec Melvin. He does not feel the patient need inpatient medical management as usually getting the CK to below 5000 is their goal and they would simply continue fluids.    [AH]  1202 Patient is still agitated, standing naked on the bed. Requireing redirection by multiple staff members.   [AH]  1221 [facility] I spoke with Wells GuilesSarah Marshall with TTS who [facility] order was place in  error. Currently still awaiting placement.    [AH]  1223 Dr. Charm BargesButler spoke with Dr. Allena KatzPatel of Psych who asked that we dc the antipsychotics as this may be there cause of her rhabdo. I have dc'd the zyprexa   [AH]  1600 I spoke with Dr. Sharma CovertNorman. She states "If (pt) is refusing Depakote you can give her IV Depakote if she still has IV access. If you need further recommendations then you can place a TTS consult."   [AH]  1601 Patient given versed for mgmt of agitation. She is persistently tachycardic. Patient continues to be hight agitated,requiring multiple ED resources. She is also a danger to herself and is standing on her bed. I have asked Charge to see if we can get a lower bed from upstairs on the floor for her safety.   [AH]  1604 Patient with elevated CK- went from 5400 to 4000 with 3 L of fluid. Although muscle injury may have played a part in this, I have higher suspicion it is related to her repeated doses of antipsychotic given the fact that it did not resolve with that amount of fluid. I also reviewed the Bmp which shows improvement in the patient's renal fx.   [AH]    Clinical Course User Index [AH] Arthor CaptainHarris, Abigail, PA-C   I have reviewed the labs performed to date as well as medications administered while in observation.  Recent changes in the  last 24 hours include rising CPK Plan  Current plan is for repeat labs, ivf.    Hayden Rasmussen, MD 07/06/19 [phone]
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
## 2019-07-05 NOTE — ED Notes (Signed)
Removed bilateral soft wrist restraints from pt

## 2019-07-05 NOTE — ED Provider Notes (Signed)
Care assumed from Dr. Melina Copa.  At time of transfer care, patient remains tachycardic and is having more fluids provided and having CK reassessed.  Patient has been in the emergency department for over 104 hours at my time of assumption of care.   It is recommended the patient to inpatient psychiatry however I am concerned about her continued agitation with the concern for rhabdomyolysis.  Psychiatry recommended discontinuation of Zyprexa and antipsychotics to not be given.  Thus, patient will given more benzos if she has more agitation.  Patient is out of restraints currently but is needing constant care by nursing or the sitter.  Her heart rate still remains between 110 and 130s.  Patient is having the tachycardia even while at rest.  Patient CK still remains over 4000 despite over 3 L of fluid.  With her persistent tachycardia, we do not feel that she is going to be medically cleared enough to be admitted to inpatient psychiatry at this time.  We will try to get her admitted for further monitoring of her tachycardia, rehydration for her rhabdo, and agitation management as well as further psychiatric management for her suicidal ideation, hallucinations, and severe anxiety.   Hospitalistservice will be patient in the requested cardiology consultation.  Cardiology was called.  Patient will be admitted for further management.   Clinical Impression: 1. Suicidal behavior without attempted self-injury   2. Mania ([place])   3. Non-traumatic rhabdomyolysis   4. Tachycardia     Disposition: Admit  This note was prepared with assistance of Dragon voice recognition software. Occasional wrong-word or sound-a-like substitutions may have occurred due to the inherent limitations of voice recognition software.     Seini Lannom, Gwenyth Allegra, MD 07/06/19 [phone]

## 2019-07-05 NOTE — ED Notes (Signed)
Pt seen by RN standing next to the bed. Pt tripped on EKG wires. RN assisted fall, and lead her to the ground, landing on her buttocks. Pt did not hit anything on the way down.

## 2019-07-05 NOTE — ED Notes (Signed)
Call mother debra   [phone]

## 2019-07-05 NOTE — ED Provider Notes (Signed)
Emergency Medicine Observation Re-evaluation Note Taken in sign out from PA [employer] Mickler is a 28 y.o. female, seen on rounds today.  Pt initially presented to the ED for complaints of Suicidal and Delusional Currently, the patient is Being treated for elevated CK after prolonged period of restraint. Getting 3rd liter of fluid.  Will recheck chemistry and CK levels.  Physical Exam  BP 104/90 (BP Location: Right Arm)   Pulse (!) 128   Temp 98.3 F (36.8 C) (Axillary)   Resp 20   SpO2 97%  Physical Exam Vitals signs and nursing note reviewed.  Constitutional:      General: She is not in acute distress.    Appearance: She is well-developed. She is not diaphoretic.  HENT:     Head: Normocephalic and atraumatic.  Eyes:     General: No scleral icterus.    Conjunctiva/sclera: Conjunctivae normal.  Neck:     Musculoskeletal: Normal range of motion.  Cardiovascular:     Rate and Rhythm: Normal rate and regular rhythm.     Heart sounds: Normal heart sounds. No murmur. No friction rub. No gallop.   Pulmonary:     Effort: Pulmonary effort is normal. No respiratory distress.     Breath sounds: Normal breath sounds.  Abdominal:     General: Bowel sounds are normal. There is no distension.     Palpations: Abdomen is soft. There is no mass.     Tenderness: There is no abdominal tenderness. There is no guarding.  Musculoskeletal:     Right lower leg: No edema.     Left lower leg: No edema.     Comments: No evidence of compartment syndrome both calves soft.   Skin:    General: Skin is warm and dry.  Neurological:     Mental Status: She is alert. She is disoriented.  Psychiatric:        Attention and Perception: She is inattentive. She perceives auditory and visual hallucinations.        Mood and Affect: Affect is labile.        Speech: She is noncommunicative. Speech is rapid and pressured (currently) and tangential.        Behavior: Behavior is hyperactive (But redirectable).      ED Course / MDM  EKG:EKG Interpretation  Date/Time:  Wednesday July 03 2019 18:27:53 EDT Ventricular Rate:  115 PR Interval:  122 QRS Duration: 80 QT Interval:  348 QTC Calculation: 481 R Axis:   84 Text Interpretation:  Sinus tachycardia Otherwise normal ECG No STEMI  Confirmed by Nanda Quinton [phone]) on 07/03/2019 6:37:00 PM Also confirmed by Nanda Quinton [phone]), editor Philomena Doheny [phone])  on 07/04/2019 8:45:42 AM  Clinical Course as of Jul 04 1605  Fri Jul 05, 2019  0710 Anion gap(!): 16 [AH]  0915 Ct head negative    [AH]  5625 Basic metabolic panel(!) [AH]  6389 CO2(!): 18 [AH]  1125 Spoke with internal medicine resident service admitting physician Dr. Pearson Grippe. He does not feel the patient need inpatient medical management as usually getting the CK to below 5000 is their goal and they would simply continue fluids.    [AH]  1202 Patient is still agitated, standing naked on the bed. Requireing redirection by multiple staff members.   [AH]  1221 Order placed last night for [facility] admission floor 500. I spoke with Audree Camel with TTS who confirmed with staff at [facility] that the order was place in error. Currently still awaiting  placement.    [AH]  1223 Dr. Charm BargesButler spoke with Dr. Allena KatzPatel of Psych who asked that we dc the antipsychotics as this may be there cause of her rhabdo. I have dc'd the zyprexa   [AH]  1600 I spoke with Dr. Sharma CovertNorman. She states "If (pt) is refusing Depakote you can give her IV Depakote if she still has IV access. If you need further recommendations then you can place a TTS consult."   [AH]  1601 Patient given versed for mgmt of agitation. She is persistently tachycardic. Patient continues to be hight agitated,requiring multiple ED resources. She is also a danger to herself and is standing on her bed. I have asked Charge to see if we can get a lower bed from upstairs on the floor for her safety.   [AH]  1604 Patient with elevated CK- went from 5400 to  4000 with 3 L of fluid. Although muscle injury may have played a part in this, I have higher suspicion it is related to her repeated doses of antipsychotic given the fact that it did not resolve with that amount of fluid. I also reviewed the Bmp which shows improvement in the patient's renal fx.   [AH]    Clinical Course User Index [AH] Arthor CaptainHarris, Darius Fillingim, PA-C   I have reviewed the labs performed to date as well as medications administered while in observation.  Recent changes in the last 24 hours include AS ADDRESSED IN THE ED COURSE Plan  Current plan is for INPATIENT PSYCH.    Arthor CaptainHarris, Aniqua Briere, PA-C 07/05/19 1607    Terrilee FilesButler, Michael C, MD 07/06/19 [phone]

## 2019-07-05 NOTE — Progress Notes (Signed)
[facility] referral completed. Authorization #: [id number].   Disposition will continue to follow.    Audree Camel, LCSW, [place] Disposition [facility]/TTS [phone]

## 2019-07-06 ENCOUNTER — Observation Stay (HOSPITAL_COMMUNITY): Payer: BC Managed Care – PPO

## 2019-07-06 ENCOUNTER — Inpatient Hospital Stay (HOSPITAL_COMMUNITY): Payer: BC Managed Care – PPO

## 2019-07-06 DIAGNOSIS — E871 Hypo-osmolality and hyponatremia: Secondary | ICD-10-CM | POA: Diagnosis not present

## 2019-07-06 DIAGNOSIS — R4182 Altered mental status, unspecified: Secondary | ICD-10-CM | POA: Diagnosis present

## 2019-07-06 DIAGNOSIS — N170 Acute kidney failure with tubular necrosis: Secondary | ICD-10-CM | POA: Diagnosis not present

## 2019-07-06 DIAGNOSIS — G9340 Encephalopathy, unspecified: Secondary | ICD-10-CM | POA: Diagnosis not present

## 2019-07-06 DIAGNOSIS — D509 Iron deficiency anemia, unspecified: Secondary | ICD-10-CM | POA: Diagnosis present

## 2019-07-06 DIAGNOSIS — R4701 Aphasia: Secondary | ICD-10-CM | POA: Diagnosis not present

## 2019-07-06 DIAGNOSIS — F23 Brief psychotic disorder: Secondary | ICD-10-CM | POA: Diagnosis not present

## 2019-07-06 DIAGNOSIS — I10 Essential (primary) hypertension: Secondary | ICD-10-CM | POA: Diagnosis present

## 2019-07-06 DIAGNOSIS — B370 Candidal stomatitis: Secondary | ICD-10-CM | POA: Diagnosis not present

## 2019-07-06 DIAGNOSIS — D649 Anemia, unspecified: Secondary | ICD-10-CM | POA: Diagnosis present

## 2019-07-06 DIAGNOSIS — F29 Unspecified psychosis not due to a substance or known physiological condition: Secondary | ICD-10-CM | POA: Diagnosis not present

## 2019-07-06 DIAGNOSIS — R339 Retention of urine, unspecified: Secondary | ICD-10-CM | POA: Diagnosis not present

## 2019-07-06 DIAGNOSIS — R011 Cardiac murmur, unspecified: Secondary | ICD-10-CM | POA: Diagnosis present

## 2019-07-06 DIAGNOSIS — E870 Hyperosmolality and hypernatremia: Secondary | ICD-10-CM | POA: Diagnosis not present

## 2019-07-06 DIAGNOSIS — R748 Abnormal levels of other serum enzymes: Secondary | ICD-10-CM | POA: Diagnosis present

## 2019-07-06 DIAGNOSIS — R45851 Suicidal ideations: Secondary | ICD-10-CM

## 2019-07-06 DIAGNOSIS — E669 Obesity, unspecified: Secondary | ICD-10-CM | POA: Diagnosis present

## 2019-07-06 DIAGNOSIS — Z6836 Body mass index (BMI) 36.0-36.9, adult: Secondary | ICD-10-CM | POA: Diagnosis not present

## 2019-07-06 DIAGNOSIS — F419 Anxiety disorder, unspecified: Secondary | ICD-10-CM | POA: Diagnosis present

## 2019-07-06 DIAGNOSIS — F323 Major depressive disorder, single episode, severe with psychotic features: Secondary | ICD-10-CM | POA: Diagnosis present

## 2019-07-06 DIAGNOSIS — F202 Catatonic schizophrenia: Secondary | ICD-10-CM | POA: Diagnosis present

## 2019-07-06 DIAGNOSIS — Z20828 Contact with and (suspected) exposure to other viral communicable diseases: Secondary | ICD-10-CM | POA: Diagnosis present

## 2019-07-06 DIAGNOSIS — F061 Catatonic disorder due to known physiological condition: Secondary | ICD-10-CM | POA: Diagnosis present

## 2019-07-06 DIAGNOSIS — F329 Major depressive disorder, single episode, unspecified: Secondary | ICD-10-CM | POA: Diagnosis present

## 2019-07-06 DIAGNOSIS — R000 Tachycardia, unspecified: Secondary | ICD-10-CM

## 2019-07-06 DIAGNOSIS — R41843 Psychomotor deficit: Secondary | ICD-10-CM | POA: Diagnosis present

## 2019-07-06 DIAGNOSIS — E876 Hypokalemia: Secondary | ICD-10-CM | POA: Diagnosis not present

## 2019-07-06 DIAGNOSIS — F309 Manic episode, unspecified: Secondary | ICD-10-CM

## 2019-07-06 DIAGNOSIS — J690 Pneumonitis due to inhalation of food and vomit: Secondary | ICD-10-CM | POA: Diagnosis not present

## 2019-07-06 DIAGNOSIS — M6282 Rhabdomyolysis: Secondary | ICD-10-CM | POA: Diagnosis present

## 2019-07-06 DIAGNOSIS — J45909 Unspecified asthma, uncomplicated: Secondary | ICD-10-CM | POA: Diagnosis present

## 2019-07-06 DIAGNOSIS — E878 Other disorders of electrolyte and fluid balance, not elsewhere classified: Secondary | ICD-10-CM | POA: Diagnosis not present

## 2019-07-06 HISTORY — DX: Unspecified psychosis not due to a substance or known physiological condition: F29

## 2019-07-06 LAB — COMPREHENSIVE METABOLIC PANEL
ALT: 79 U/L — ABNORMAL HIGH (ref 0–44)
AST: 57 U/L — ABNORMAL HIGH (ref 15–41)
Albumin: 3.4 g/dL — ABNORMAL LOW (ref 3.5–5.0)
Alkaline Phosphatase: 42 U/L (ref 38–126)
Anion gap: 14 (ref 5–15)
BUN: 6 mg/dL (ref 6–20)
CO2: 18 mmol/L — ABNORMAL LOW (ref 22–32)
Calcium: 8.9 mg/dL (ref 8.9–10.3)
Chloride: 113 mmol/L — ABNORMAL HIGH (ref 98–111)
Creatinine, Ser: 1.09 mg/dL — ABNORMAL HIGH (ref 0.44–1.00)
GFR calc Af Amer: >60 mL/min (ref >60)
GFR calc non Af Amer: >60 mL/min (ref >60)
Glucose, Bld: 71 mg/dL (ref 70–99)
Potassium: 3.4 mmol/L — ABNORMAL LOW (ref 3.5–5.1)
Sodium: 145 mmol/L (ref 135–145)
Total Bilirubin: 0.9 mg/dL (ref 0.3–1.2)
Total Protein: 6.9 g/dL (ref 6.5–8.1)

## 2019-07-06 LAB — CK
Total CK: 2968 U/L — ABNORMAL HIGH (ref 38–234)
Total CK: 2729 U/L — ABNORMAL HIGH (ref 38–234)

## 2019-07-06 LAB — PHOSPHORUS: Phosphorus: 5.1 mg/dL — ABNORMAL HIGH (ref 2.5–4.6)

## 2019-07-06 LAB — CBC
HCT: 34.5 % — ABNORMAL LOW (ref 36.0–46.0)
Hemoglobin: 10.7 g/dL — ABNORMAL LOW (ref 12.0–15.0)
MCH: 26.4 pg (ref 26.0–34.0)
MCHC: 31 g/dL (ref 30.0–36.0)
MCV: 85 fL (ref 80.0–100.0)
Platelets: 329 10*3/uL (ref 150–400)
RBC: 4.06 MIL/uL (ref 3.87–5.11)
RDW: 12.9 % (ref 11.5–15.5)
WBC: 8.6 10*3/uL (ref 4.0–10.5)
nRBC: 0 % (ref 0.0–0.2)

## 2019-07-06 LAB — HIV ANTIBODY (ROUTINE TESTING W REFLEX): HIV Screen 4th Generation wRfx: NONREACTIVE

## 2019-07-06 MED ORDER — LORAZEPAM 1 MG PO TABS: 1.0000 mg | ORAL_TABLET | Freq: Four times a day (QID) | ORAL | Status: AC | PRN

## 2019-07-06 MED ORDER — VITAMIN B-1 100 MG PO TABS
100.0000 mg | ORAL_TABLET | Freq: Every day | ORAL | Status: DC
  Administered 2019-07-29 – 2019-08-05 (×6): 100 mg via ORAL
  Filled 2019-07-06 (×16): qty 1

## 2019-07-06 MED ORDER — SODIUM CHLORIDE 0.9 % IV SOLN
INTRAVENOUS | Status: DC
  Administered 2019-07-06 (×2): via INTRAVENOUS

## 2019-07-06 MED ORDER — THIAMINE HCL 100 MG/ML IJ SOLN
100.0000 mg | Freq: Every day | INTRAMUSCULAR | Status: DC
  Administered 2019-07-07 – 2019-08-01 (×19): 100 mg via INTRAVENOUS
  Filled 2019-07-06 (×20): qty 2

## 2019-07-06 MED ORDER — FOLIC ACID 1 MG PO TABS
1.0000 mg | ORAL_TABLET | Freq: Every day | ORAL | Status: DC
  Administered 2019-07-26 – 2019-08-05 (×8): 1 mg via ORAL
  Filled 2019-07-06 (×19): qty 1

## 2019-07-06 MED ORDER — ENOXAPARIN SODIUM 40 MG/0.4ML ~~LOC~~ SOLN
40.0000 mg | Freq: Every day | SUBCUTANEOUS | Status: DC
  Administered 2019-07-07 – 2019-07-11 (×5): 40 mg via SUBCUTANEOUS
  Filled 2019-07-06 (×6): qty 0.4

## 2019-07-06 MED ORDER — SODIUM CHLORIDE 0.9 % IV SOLN
INTRAVENOUS | Status: AC
  Administered 2019-07-06: 12:00:00 via INTRAVENOUS

## 2019-07-06 MED ORDER — POTASSIUM CHLORIDE CRYS ER 20 MEQ PO TBCR
40.0000 meq | EXTENDED_RELEASE_TABLET | Freq: Once | ORAL | Status: AC
  Administered 2019-07-06: 09:00:00 40 meq via ORAL
  Filled 2019-07-06: qty 2

## 2019-07-06 MED ORDER — LORAZEPAM 2 MG/ML IJ SOLN
1.0000 mg | Freq: Four times a day (QID) | INTRAMUSCULAR | Status: DC | PRN
  Administered 2019-07-06 – 2019-07-09 (×4): 1 mg via INTRAMUSCULAR
  Filled 2019-07-06 (×3): qty 1

## 2019-07-06 MED ORDER — METOPROLOL TARTRATE 5 MG/5ML IV SOLN
5.0000 mg | INTRAVENOUS | Status: DC | PRN
  Administered 2019-07-06 – 2019-07-09 (×6): 5 mg via INTRAVENOUS
  Filled 2019-07-06 (×5): qty 5

## 2019-07-06 MED ORDER — ADULT MULTIVITAMIN W/MINERALS CH
1.0000 | ORAL_TABLET | Freq: Every day | ORAL | Status: DC
  Filled 2019-07-06 (×5): qty 1

## 2019-07-06 MED ORDER — ADULT MULTIVITAMIN W/MINERALS CH: 1.0000 | ORAL_TABLET | Freq: Every day | ORAL | Status: DC

## 2019-07-06 MED ORDER — LORAZEPAM 2 MG/ML IJ SOLN
1.0000 mg | Freq: Four times a day (QID) | INTRAMUSCULAR | Status: AC | PRN
  Administered 2019-07-07 – 2019-07-08 (×4): 1 mg via INTRAVENOUS
  Filled 2019-07-06 (×7): qty 1

## 2019-07-06 NOTE — Progress Notes (Signed)
Pt become very agitated. Elevated HR 140. Notified MD and got order of 1 mg Ativan IM.

## 2019-07-06 NOTE — Procedures (Signed)
Heart rate is too high for accurate echo at this time. 

## 2019-07-06 NOTE — Progress Notes (Addendum)
Pt become tachypneic RR 64/min, but not persistent. O2 sat maintain 95-100% on RA,  BP 156/10, HR 132.  5 mg IV metoprolol was given. Notified provider Dr Cruzita Lederer Continue to monitor pt.

## 2019-07-06 NOTE — Progress Notes (Signed)
PROGRESS NOTE  Veronica BaileyKyla Arias VWU:[id number] DOB: [birth year] DOA: 07/01/2019 PCP: Patient, No Pcp Per   LOS: 0 days   Brief Narrative / Interim history: 28 year old female with history of asthma who initially came to the ED on 07/01/2019 for evaluation of suicidal ideation, paranoia, bizarre behavior.  She was in the ED 2 days prior to that with similar behavior.  She was found to be on her balcony of her apartment, throwing things on the street saying that she was suicidal and was going to jump.  Psychiatry recommended inpatient psych admission and she was IVC and waiting in the ED, and remained significantly agitated throughout her ED stay for the past 4 days, trying to pull out IV lines, refusing p.o.'s.  She was also found to sinus tachycardia, and on blood work it was noted that her CK was significantly elevated and uptrending.  Psychiatry recommending stopping antipsychotics/Zyprexa as they may cause rhabdomyolysis.  Cardiology was consulted for tachycardia, and we admitted patient due to ongoing medical problems and until medical clearance is obtained  Subjective: Patient has her eyes closed when I entered the room, nonverbal, flickering her eyelids but not interacting with me  Assessment & Plan: Principal Problem:   Rhabdomyolysis Active Problems:   Sinus tachycardia   Suicidal ideation   Psychosis (HCC)   Principal Problem Rhabdomyolysis -Possibly due to severe agitation and/or antipsychotic drugs.  She was given aggressive IV fluids overnight, CKs improved this morning.  Continue to trend, continue IV fluids but decrease the dose today -Hold Zyprexa -Given episodes of unresponsiveness with eyes flickering will obtain an EEG to rule out seizures  Active Problems Sinus tachycardia -Heart rate persistent into the 120s, possibly due to the underlying psychiatric illness.  TSH was done and was normal. -Appreciate cardiology consultation, obtain a 2D echo,?  Withdrawal from unknown  substance, versus pain/anxiety -Monitor, treatment relates to addressing underlying issue -Continue fluids -Evaluate for infection, chest x-ray clear, urinalysis pending  Suicidal ideation, agitation/psychosis -Psych following, plan for inpatient psych after medical clearance.  Continue sitter at bedside, hold off antipsychotics given rhabdomyolysis.  Continue Depakote.  Ativan as needed.  Scheduled Meds: . divalproex  500 mg Oral Q12H  . enoxaparin (LOVENOX) injection  40 mg Subcutaneous Daily  . folic acid  1 mg Oral Daily  . multivitamin with minerals  1 tablet Oral Daily  . nicotine  21 mg Transdermal Q0600  . thiamine  100 mg Oral Daily   Or  . thiamine  100 mg Intravenous Daily   Continuous Infusions: . sodium chloride 150 mL/hr at 07/06/19 0827   PRN Meds:.acetaminophen, alum & mag hydroxide-simeth, LORazepam **OR** LORazepam, magnesium hydroxide, metoprolol tartrate  DVT prophylaxis: Lovenox Code Status: Full code Family Communication: no family present Disposition Plan: TBD  Consultants:   Psychiatry   Procedures:   2D echo: pending  Antimicrobials:  None    Objective: Vitals:   07/06/19 0132 07/06/19 0300 07/06/19 0347 07/06/19 1007  BP: (!) 143/97  (!) 158/102 (!) 142/100  Pulse: (!) 126 (!) 110 (!) 118 (!) 127  Resp: 18  20 16   Temp: 99.1 F (37.3 C)  99.5 F (37.5 C) 99.2 F (37.3 C)  TempSrc: Oral  Axillary Oral  SpO2: 100%  98% 100%  Weight: 82.2 kg     Height: 5\' 3"  (1.6 m)       Intake/Output Summary (Last 24 hours) at 07/06/2019 1103 Last data filed at 07/06/2019 0300 Gross per 24 hour  Intake 1307.37 ml  Output 0 ml  Net 1307.37 ml   Filed Weights   07/06/19 0132  Weight: 82.2 kg    Examination:  Constitutional: No apparent distress, eyes are closed Eyes: PERRL, lids and conjunctivae normal ENMT: Mucous membranes are moist.  Respiratory: clear to auscultation bilaterally, no wheezing, no crackles. Normal respiratory effort. No  accessory muscle use.  Cardiovascular: Regular rate and rhythm, no murmurs / rubs / gallops. No LE edema.  Tachycardic Abdomen: no tenderness. Bowel sounds positive.  Musculoskeletal: no clubbing / cyanosis.  Skin: no rashes, lesions, ulcers. No induration Neurologic: Withdraws to pain and appears to move all 4 spontaneously, does not follow commands   Data Reviewed: I have independently reviewed following labs and imaging studies   CBC: Recent Labs  Lab 07/01/19 0929 07/03/19 2226 07/04/19 2204 07/05/19 1010 07/06/19 0455  WBC 9.2 10.1 9.8 10.9* 8.6  NEUTROABS 5.7 6.2  --  7.4  --   HGB 12.0 13.1 12.6 11.4* 10.7*  HCT 38.7 41.9 40.7 36.7 34.5*  MCV 83.9 82.8 85.3 84.8 85.0  PLT 362 398 383 361 329   Basic Metabolic Panel: Recent Labs  Lab 07/03/19 2226 07/04/19 2204 07/05/19 1010 07/05/19 1722 07/06/19 0455  NA 139 135 141 143 145  K 3.3* 3.7 3.9 3.6 3.4*  CL 102 102 110 113* 113*  CO2 19* 17* 18* 19* 18*  GLUCOSE 72 63* 79 78 71  BUN 10 5* <5* <5* 6  CREATININE 0.98 1.05* 0.82 0.89 1.09*  CALCIUM 9.7 9.4 8.9 8.8* 8.9  PHOS  --   --   --   --  5.1*   GFR: Estimated Creatinine Clearance: 79.4 mL/min (A) (by C-G formula based on SCr of 1.09 mg/dL (H)). Liver Function Tests: Recent Labs  Lab 07/01/19 0929 07/03/19 2226 07/04/19 2204 07/05/19 1722 07/06/19 0455  AST 76* 81* 79* 70* 57*  ALT 145* 146* 121* 90* 79*  ALKPHOS 52 59 57 50 42  BILITOT 1.0 1.0 1.3* 1.2 0.9  PROT 8.2* 8.8* 8.3* 7.0 6.9  ALBUMIN 4.4 4.5 4.1 3.4* 3.4*   No results for input(s): LIPASE, AMYLASE in the last 168 hours. No results for input(s): AMMONIA in the last 168 hours. Coagulation Profile: No results for input(s): INR, PROTIME in the last 168 hours. Cardiac Enzymes: Recent Labs  Lab 07/04/19 2204 07/05/19 1010 07/05/19 1722 07/06/19 0455 07/06/19 0724  CKTOTAL 4,536* 4,011* 4,037* 2,968* 2,729*   BNP (last 3 results) No results for input(s): PROBNP in the last 8760  hours. HbA1C: No results for input(s): HGBA1C in the last 72 hours. CBG: Recent Labs  Lab 07/05/19 0217 07/05/19 0614  GLUCAP 91 76   Lipid Profile: No results for input(s): CHOL, HDL, LDLCALC, TRIG, CHOLHDL, LDLDIRECT in the last 72 hours. Thyroid Function Tests: No results for input(s): TSH, T4TOTAL, FREET4, T3FREE, THYROIDAB in the last 72 hours. Anemia Panel: No results for input(s): VITAMINB12, FOLATE, FERRITIN, TIBC, IRON, RETICCTPCT in the last 72 hours. Urine analysis: No results found for: COLORURINE, APPEARANCEUR, LABSPEC, PHURINE, GLUCOSEU, HGBUR, BILIRUBINUR, KETONESUR, PROTEINUR, UROBILINOGEN, NITRITE, LEUKOCYTESUR Sepsis Labs: Invalid input(s): PROCALCITONIN, LACTICIDVEN  Recent Results (from the past 240 hour(s))  SARS Coronavirus 2 [facility](Hospital order, [facility]) Nasopharyngeal Nasopharyngeal Swab     Status: None   Collection Time: 07/01/19  9:46 PM   Specimen: Nasopharyngeal Swab  Result Value Ref Range Status   SARS Coronavirus 2 NEGATIVE NEGATIVE Final    Comment: (NOTE) If result is NEGATIVE SARS-CoV-2 target  nucleic acids are NOT DETECTED. The SARS-CoV-2 RNA is generally detectable in upper and lower  respiratory specimens during the acute phase of infection. The lowest  concentration of SARS-CoV-2 viral copies this assay can detect is 250  copies / mL. A negative result does not preclude SARS-CoV-2 infection  and should not be used as the sole basis for treatment or other  patient management decisions.  A negative result may occur with  improper specimen collection / handling, submission of specimen other  than nasopharyngeal swab, presence of viral mutation(s) within the  areas targeted by this assay, and inadequate number of viral copies  (<250 copies / mL). A negative result must be combined with clinical  observations, patient history, and epidemiological information. If result is POSITIVE SARS-CoV-2 target nucleic acids are  DETECTED. The SARS-CoV-2 RNA is generally detectable in upper and lower  respiratory specimens dur ing the acute phase of infection.  Positive  results are indicative of active infection with SARS-CoV-2.  Clinical  correlation with patient history and other diagnostic information is  necessary to determine patient infection status.  Positive results do  not rule out bacterial infection or co-infection with other viruses. If result is PRESUMPTIVE POSTIVE SARS-CoV-2 nucleic acids MAY BE PRESENT.   A presumptive positive result was obtained on the submitted specimen  and confirmed on repeat testing.  While 2019 novel coronavirus  (SARS-CoV-2) nucleic acids may be present in the submitted sample  additional confirmatory testing may be necessary for epidemiological  and / or clinical management purposes  to differentiate between  SARS-CoV-2 and other Sarbecovirus currently known to infect humans.  If clinically indicated additional testing with an alternate test  methodology [phone]) is advised. The SARS-CoV-2 RNA is generally  detectable in upper and lower respiratory sp ecimens during the acute  phase of infection. The expected result is Negative. Fact Sheet for Patients:  StrictlyIdeas.no Fact Sheet for Healthcare Providers: BankingDealers.co.za This test is not yet approved or cleared by the [place] FDA and has been authorized for detection and/or diagnosis of SARS-CoV-2 by FDA under an Emergency Use Authorization (EUA).  This EUA will remain in effect (meaning this test can be used) for the duration of the COVID-19 declaration under Section 564(b)(1) of the Act, 21 U.S.C. section 360bbb-3(b)(1), unless the authorization is terminated or revoked sooner. Performed at [facility], [place] [address] [place], [place] [address]       Radiology Studies: Ct Head Wo Contrast  Result Date: 07/05/2019 CLINICAL DATA:  Altered  level of consciousness EXAM: CT HEAD WITHOUT CONTRAST TECHNIQUE: Contiguous axial images were obtained from the base of the skull through the vertex without intravenous contrast. COMPARISON:  None. FINDINGS: Brain: No evidence of acute infarction, hemorrhage, hydrocephalus, extra-axial collection or mass lesion/mass effect. Vascular: No hyperdense vessel or unexpected calcification. Skull: Normal. Negative for fracture or focal lesion. Sinuses/Orbits: No acute finding. Other: None. IMPRESSION: No acute intracranial abnormality noted. Electronically Signed   By: Inez Catalina M.D.   On: 07/05/2019 09:02   Dg Chest Port 1 View  Result Date: 07/06/2019 CLINICAL DATA:  Tachycardia EXAM: PORTABLE CHEST 1 VIEW COMPARISON:  12/02/2013 FINDINGS: The heart size and mediastinal contours are within normal limits. Both lungs are clear. The visualized skeletal structures are unremarkable. IMPRESSION: No active disease. Electronically Signed   By: Ulyses Jarred M.D.   On: 07/06/2019 01:01    Marzetta Board, MD, PhD [facility]  Contact via  [email or web address]  [facility] Office Info P:  [phone] F: [phone]

## 2019-07-06 NOTE — Progress Notes (Signed)
Pt has been ST. No change.   07/06/19 0348  MEWS Assessment  Is this an acute change? No

## 2019-07-06 NOTE — Progress Notes (Addendum)
Pt has Mews score of 2. This is not acute change. Pt has been Tachycardic from admission.

## 2019-07-06 NOTE — Progress Notes (Signed)
Patient ID: Veronica Arias, female   DOB: Jun 07, 1991, 28 y.o.   MRN: [id number] Pt has been admitted to the medical floor. Please consult psychiatry if needed after medically cleared.  Ethelene Hal, NP-C 07/06/2019      1446

## 2019-07-06 NOTE — Progress Notes (Signed)
Pt has Mews score of 5. This change due to pt get agitated and elevated respiration. Pt's O2 sat maintain  95-100% on RA. HR 132, BP 156/10.  5 mg IV metoprolol was given. Notified MD After pt calm down, her respiration back to normal rate.  Rechecked vital signs: BP 136/91, HR 116, RR 20, O2 sat 99% RA

## 2019-07-07 ENCOUNTER — Inpatient Hospital Stay (HOSPITAL_COMMUNITY): Payer: BC Managed Care – PPO

## 2019-07-07 DIAGNOSIS — R079 Chest pain, unspecified: Secondary | ICD-10-CM

## 2019-07-07 LAB — ECHOCARDIOGRAM COMPLETE
Height: 63 in
Weight: 2936 oz

## 2019-07-07 LAB — CBC
HCT: 34.4 % — ABNORMAL LOW (ref 36.0–46.0)
Hemoglobin: 10.8 g/dL — ABNORMAL LOW (ref 12.0–15.0)
MCH: 26.2 pg (ref 26.0–34.0)
MCHC: 31.4 g/dL (ref 30.0–36.0)
MCV: 83.3 fL (ref 80.0–100.0)
Platelets: 331 10*3/uL (ref 150–400)
RBC: 4.13 MIL/uL (ref 3.87–5.11)
RDW: 13.1 % (ref 11.5–15.5)
WBC: 8.3 10*3/uL (ref 4.0–10.5)
nRBC: 0 % (ref 0.0–0.2)

## 2019-07-07 LAB — BASIC METABOLIC PANEL
Anion gap: 11 (ref 5–15)
Anion gap: 13 (ref 5–15)
BUN: <5 mg/dL — ABNORMAL LOW (ref 6–20)
BUN: 6 mg/dL (ref 6–20)
CO2: 22 mmol/L (ref 22–32)
CO2: 20 mmol/L — ABNORMAL LOW (ref 22–32)
Calcium: 9.2 mg/dL (ref 8.9–10.3)
Calcium: 9.3 mg/dL (ref 8.9–10.3)
Chloride: 109 mmol/L (ref 98–111)
Chloride: 114 mmol/L — ABNORMAL HIGH (ref 98–111)
Creatinine, Ser: 1.04 mg/dL — ABNORMAL HIGH (ref 0.44–1.00)
Creatinine, Ser: 1.16 mg/dL — ABNORMAL HIGH (ref 0.44–1.00)
GFR calc Af Amer: >60 mL/min (ref >60)
GFR calc Af Amer: >60 mL/min (ref >60)
GFR calc non Af Amer: >60 mL/min (ref >60)
GFR calc non Af Amer: >60 mL/min (ref >60)
Glucose, Bld: 101 mg/dL — ABNORMAL HIGH (ref 70–99)
Glucose, Bld: 76 mg/dL (ref 70–99)
Potassium: 2.9 mmol/L — ABNORMAL LOW (ref 3.5–5.1)
Potassium: 3.2 mmol/L — ABNORMAL LOW (ref 3.5–5.1)
Sodium: 142 mmol/L (ref 135–145)
Sodium: 147 mmol/L — ABNORMAL HIGH (ref 135–145)

## 2019-07-07 LAB — CK: Total CK: 2221 U/L — ABNORMAL HIGH (ref 38–234)

## 2019-07-07 MED ORDER — POTASSIUM CHLORIDE 10 MEQ/100ML IV SOLN
10.0000 meq | INTRAVENOUS | Status: AC
  Administered 2019-07-07 (×3): 10 meq via INTRAVENOUS
  Filled 2019-07-07 (×3): qty 100

## 2019-07-07 MED ORDER — LORAZEPAM 2 MG/ML IJ SOLN
2.0000 mg | Freq: Once | INTRAMUSCULAR | Status: AC
  Administered 2019-07-07: 2 mg via INTRAVENOUS
  Filled 2019-07-07: qty 1

## 2019-07-07 MED ORDER — SODIUM CHLORIDE 0.9 % IV SOLN
INTRAVENOUS | Status: DC
  Administered 2019-07-07 – 2019-07-08 (×2): via INTRAVENOUS

## 2019-07-07 NOTE — Progress Notes (Signed)
MD Called and re-rounded to recheck on patient

## 2019-07-07 NOTE — Progress Notes (Signed)
Paged md to notify of patient status

## 2019-07-07 NOTE — Progress Notes (Signed)
Patient is on CIWA and suicide precautions, waiting for psychologist consults

## 2019-07-07 NOTE — Progress Notes (Signed)
Pt MEWS score 2 is consistent with previous scores. Patient agitated and HR elevated. Metroprolol will be given when IV site is available.

## 2019-07-07 NOTE — Progress Notes (Addendum)
Pt is confused and impulsive  Combative pulling lines out and not following command  with NT  And got out of bed. Lost balance and fell hitting back on the walk per Miachel Roux was able walk patient back to bed with no apparent injury. See fall assessment flow sheet.

## 2019-07-07 NOTE — Progress Notes (Signed)
Patient experiencing tremors with rapid heart rate- 177/117 gave ativan now per MD request.   Updated mother on status plan and gathered that this all new symptoms on this hospital stay.

## 2019-07-07 NOTE — Plan of Care (Signed)
  Problem: Safety: Goal: Ability to remain free from injury will improve Outcome: Progressing   

## 2019-07-07 NOTE — Progress Notes (Signed)
  Echocardiogram 2D Echocardiogram has been performed.  Veronica Arias 07/07/2019, 8:54 AM

## 2019-07-07 NOTE — Progress Notes (Signed)
PROGRESS NOTE  Veronica Arias QPY:[id number] DOB: 1991-03-10 DOA: 07/01/2019 PCP: Patient, No Pcp Per   LOS: 1 day   Brief Narrative / Interim history: 28 year old female with history of asthma who initially came to the ED on 07/01/2019 for evaluation of suicidal ideation, paranoia, bizarre behavior.    She was agitated with tachycardia associated with elevated and uptrending CPK. Patient admitted to medical service for clearance prior to transfer to behavioral health.  Patient seen by both cardiology and psychiatry.   Subjective: Alert but not interactive.  Does not answer any question.  Psychiatrist note reviewed and care plan discussed with psychiatrist who agrees that Depakote could contribute to rhabdomyolysis and that be discontinued.  Assessment & Plan: Principal Problem:   Rhabdomyolysis Active Problems:   Sinus tachycardia   Suicidal ideation   Psychosis (HCC)    Rhabdomyolysis Likely due to severe agitation with antipsychotic medication Hold Depakote for now and use as needed lorazepam IV fluids Monitor CPK with renal function, electrolytes No clinical evidence of seizures but what inherits to consider EEG to rule out ?? seizures  Sinus tachycardia Due to agitation with underlying psychiatric issues TSH within normal limits 2D echocardiogram 07/07/2019- normal EF -Heart rate persistent into the 120s, possibly due to the underlying psychiatric illness.  TSH was done and was normal. -Appreciate cardiology consultation,  -No clinical evidence of infectious etiology-no leukocytosis, no fever, chest x-ray unremarkable -PRN metoprolol  Hypokalemia Replete and repeat potassium  Suicidal ideation, agitation/psychosis -Psych following, plan for inpatient psych after medical clearance.   -Discussed with Dr. Darleene Cleaver this morning-stop Depakote, use PRN Ativan for now  Scheduled Meds: . enoxaparin (LOVENOX) injection  40 mg Subcutaneous Daily  . folic acid  1 mg Oral  Daily  . multivitamin with minerals  1 tablet Oral Daily  . nicotine  21 mg Transdermal Q0600  . thiamine  100 mg Oral Daily   Or  . thiamine  100 mg Intravenous Daily   Continuous Infusions: . sodium chloride    . potassium chloride 10 mEq (07/07/19 1212)   PRN Meds:.acetaminophen, alum & mag hydroxide-simeth, LORazepam **OR** LORazepam, LORazepam, magnesium hydroxide, metoprolol tartrate  DVT prophylaxis: Lovenox Code Status: Full code Family Communication: no family present Disposition Plan: TBD  Consultants:   Psychiatry   Procedures:   2D echo:   Antimicrobials:  None    Objective: Vitals:   07/07/19 0337 07/07/19 0745 07/07/19 1002 07/07/19 1207  BP: (!) 138/95 (!) 154/109 (!) 177/117 (!) 150/115  Pulse: 91 (!) 116 (!) 125 (!) 116  Resp: 18     Temp: 98.4 F (36.9 C) 99.3 F (37.4 C) 98.8 F (37.1 C) 98.8 F (37.1 C)  TempSrc: Oral Oral Oral Oral  SpO2: 100% 99% 97% 100%  Weight: 83.2 kg     Height:        Intake/Output Summary (Last 24 hours) at 07/07/2019 1223 Last data filed at 07/07/2019 0516 Gross per 24 hour  Intake 520 ml  Output 800 ml  Net -280 ml   Filed Weights   07/06/19 0132 07/07/19 0337  Weight: 82.2 kg 83.2 kg    Examination:  Constitutional: No apparent distress, eyes are closed Eyes: PERRL, lids and conjunctivae normal ENMT: Mucous membranes are moist.  Respiratory: clear to auscultation bilaterally, no wheezing, no crackles. Normal respiratory effort. No accessory muscle use.  Cardiovascular: Regular rate and rhythm, no murmurs / rubs / gallops. No LE edema.  Tachycardic Abdomen: no tenderness. Bowel sounds positive.  Musculoskeletal: no  clubbing / cyanosis.  Skin: no rashes, lesions, ulcers. No induration Neurologic: Alert, moves all extremities but does not answer question   Data Reviewed: I have independently reviewed following labs and imaging studies   CBC: Recent Labs  Lab 07/01/19 0929 07/03/19 2226 07/04/19  2204 07/05/19 1010 07/06/19 0455 07/07/19 0510  WBC 9.2 10.1 9.8 10.9* 8.6 8.3  NEUTROABS 5.7 6.2  --  7.4  --   --   HGB 12.0 13.1 12.6 11.4* 10.7* 10.8*  HCT 38.7 41.9 40.7 36.7 34.5* 34.4*  MCV 83.9 82.8 85.3 84.8 85.0 83.3  PLT 362 398 383 361 329 331   Basic Metabolic Panel: Recent Labs  Lab 07/04/19 2204 07/05/19 1010 07/05/19 1722 07/06/19 0455 07/07/19 0510  NA 135 141 143 145 142  K 3.7 3.9 3.6 3.4* 2.9*  CL 102 110 113* 113* 109  CO2 17* 18* 19* 18* 22  GLUCOSE 63* 79 78 71 101*  BUN 5* <5* <5* 6 <5*  CREATININE 1.05* 0.82 0.89 1.09* 1.04*  CALCIUM 9.4 8.9 8.8* 8.9 9.2  PHOS  --   --   --  5.1*  --    GFR: Estimated Creatinine Clearance: 83.7 mL/min (A) (by C-G formula based on SCr of 1.04 mg/dL (H)). Liver Function Tests: Recent Labs  Lab 07/01/19 0929 07/03/19 2226 07/04/19 2204 07/05/19 1722 07/06/19 0455  AST 76* 81* 79* 70* 57*  ALT 145* 146* 121* 90* 79*  ALKPHOS 52 59 57 50 42  BILITOT 1.0 1.0 1.3* 1.2 0.9  PROT 8.2* 8.8* 8.3* 7.0 6.9  ALBUMIN 4.4 4.5 4.1 3.4* 3.4*   No results for input(s): LIPASE, AMYLASE in the last 168 hours. No results for input(s): AMMONIA in the last 168 hours. Coagulation Profile: No results for input(s): INR, PROTIME in the last 168 hours. Cardiac Enzymes: Recent Labs  Lab 07/05/19 1010 07/05/19 1722 07/06/19 0455 07/06/19 0724 07/07/19 0510  CKTOTAL 4,011* 4,037* 2,968* 2,729* 2,221*   BNP (last 3 results) No results for input(s): PROBNP in the last 8760 hours. HbA1C: No results for input(s): HGBA1C in the last 72 hours. CBG: Recent Labs  Lab 07/05/19 0217 07/05/19 0614  GLUCAP 91 76   Lipid Profile: No results for input(s): CHOL, HDL, LDLCALC, TRIG, CHOLHDL, LDLDIRECT in the last 72 hours. Thyroid Function Tests: No results for input(s): TSH, T4TOTAL, FREET4, T3FREE, THYROIDAB in the last 72 hours. Anemia Panel: No results for input(s): VITAMINB12, FOLATE, FERRITIN, TIBC, IRON, RETICCTPCT in the  last 72 hours. Urine analysis: No results found for: COLORURINE, APPEARANCEUR, LABSPEC, PHURINE, GLUCOSEU, HGBUR, BILIRUBINUR, KETONESUR, PROTEINUR, UROBILINOGEN, NITRITE, LEUKOCYTESUR Sepsis Labs: Invalid input(s): PROCALCITONIN, LACTICIDVEN  Recent Results (from the past 240 hour(s))  SARS Coronavirus 2 [facility](Hospital order, Performed in [facility]) Nasopharyngeal Nasopharyngeal Swab     Status: None   Collection Time: 07/01/19  9:46 PM   Specimen: Nasopharyngeal Swab  Result Value Ref Range Status   SARS Coronavirus 2 NEGATIVE NEGATIVE Final    Comment: (NOTE) If result is NEGATIVE SARS-CoV-2 target nucleic acids are NOT DETECTED. The SARS-CoV-2 RNA is generally detectable in upper and lower  respiratory specimens during the acute phase of infection. The lowest  concentration of SARS-CoV-2 viral copies this assay can detect is 250  copies / mL. A negative result does not preclude SARS-CoV-2 infection  and should not be used as the sole basis for treatment or other  patient management decisions.  A negative result may occur with  improper specimen collection /  handling, submission of specimen other  than nasopharyngeal swab, presence of viral mutation(s) within the  areas targeted by this assay, and inadequate number of viral copies  (<250 copies / mL). A negative result must be combined with clinical  observations, patient history, and epidemiological information. If result is POSITIVE SARS-CoV-2 target nucleic acids are DETECTED. The SARS-CoV-2 RNA is generally detectable in upper and lower  respiratory specimens dur ing the acute phase of infection.  Positive  results are indicative of active infection with SARS-CoV-2.  Clinical  correlation with patient history and other diagnostic information is  necessary to determine patient infection status.  Positive results do  not rule out bacterial infection or co-infection with other viruses. If result is PRESUMPTIVE POSTIVE  SARS-CoV-2 nucleic acids MAY BE PRESENT.   A presumptive positive result was obtained on the submitted specimen  and confirmed on repeat testing.  While 2019 novel coronavirus  (SARS-CoV-2) nucleic acids may be present in the submitted sample  additional confirmatory testing may be necessary for epidemiological  and / or clinical management purposes  to differentiate between  SARS-CoV-2 and other Sarbecovirus currently known to infect humans.  If clinically indicated additional testing with an alternate test  methodology [phone]([id number]) is advised. The SARS-CoV-2 RNA is generally  detectable in upper and lower respiratory sp ecimens during the acute  phase of infection. The expected result is Negative. Fact Sheet for Patients:  BoilerBrush.com.cyhttps://www.fda.gov/media/136312/download Fact Sheet for Healthcare Providers: https://pope.com/https://www.fda.gov/media/136313/download This test is not yet approved or cleared by the [place] FDA and has been authorized for detection and/or diagnosis of SARS-CoV-2 by FDA under an Emergency Use Authorization (EUA).  This EUA will remain in effect (meaning this test can be used) for the duration of the COVID-19 declaration under Section 564(b)(1) of the Act, 21 U.S.C. section 360bbb-3(b)(1), unless the authorization is terminated or revoked sooner. Performed at [facility], [address] [place], [place] [address]   Culture, blood (routine x 2)     Status: None (Preliminary result)   Collection Time: 07/06/19 12:20 AM   Specimen: BLOOD  Result Value Ref Range Status   Specimen Description BLOOD RIGHT ARM  Final   Special Requests   Final    BOTTLES DRAWN AEROBIC AND ANAEROBIC Blood Culture adequate volume   Culture   Final    NO GROWTH 1 DAY Performed at [facility], [address] [place], [place] [address]    Report Status PENDING  Incomplete  Culture, blood (routine x 2)     Status: None (Preliminary result)   Collection Time: 07/06/19 12:35 AM    Specimen: BLOOD  Result Value Ref Range Status   Specimen Description BLOOD RIGHT HAND  Final   Special Requests   Final    BOTTLES DRAWN AEROBIC AND ANAEROBIC Blood Culture results may not be optimal due to an inadequate volume of blood received in culture bottles   Culture   Final    NO GROWTH 1 DAY Performed at [facility], [address] [place], [place] [address]    Report Status PENDING  Incomplete      Radiology Studies: Dg Chest Port 1 View  Result Date: 07/06/2019 CLINICAL DATA:  28 year old female with dyspnea EXAM: PORTABLE CHEST 1 VIEW COMPARISON:  Prior chest x-ray 07/06/2019 FINDINGS: No interval change in the appearance of the chest compared to the chest x-ray from earlier today. Persistent low inspiratory volumes with bibasilar atelectasis. No new airspace consolidation, pulmonary edema,  pneumothorax or pleural effusion. No acute osseous abnormality. IMPRESSION: Low inspiratory volumes with bibasilar atelectasis. No significant interval change compared to earlier this morning. Electronically Signed   By: Malachy MoanHeath  McCullough M.D.   On: 07/06/2019 14:06   Dg Chest Port 1 View  Result Date: 07/06/2019 CLINICAL DATA:  Tachycardia EXAM: PORTABLE CHEST 1 VIEW COMPARISON:  12/02/2013 FINDINGS: The heart size and mediastinal contours are within normal limits. Both lungs are clear. The visualized skeletal structures are unremarkable. IMPRESSION: No active disease. Electronically Signed   By: Deatra RobinsonKevin  Herman M.D.   On: 07/06/2019 01:01    Jackie PlumGeorge Osei-Bonsu, MD [facility]  Contact via  [email or web address]  [facility] Office Info P: [phone] F: [phone]

## 2019-07-07 NOTE — Progress Notes (Signed)
Patient is alert of and on, stated her first name without following simple commands,  displaying frequent fluctuating emotion laughing/crying cant tell which, involuntary strong grasping, lungs clear, ST 122 blp-150/100.  Currently on menstrual cycle with incontinence pericare was performed with mesh under pants with pads.  Attempted to feed was not sucessful Suicide sister/NT at beside room clears from harmful object. Unable to assess intent to harm-self at this time.

## 2019-07-08 ENCOUNTER — Inpatient Hospital Stay (HOSPITAL_COMMUNITY): Payer: BC Managed Care – PPO

## 2019-07-08 DIAGNOSIS — R45851 Suicidal ideations: Secondary | ICD-10-CM

## 2019-07-08 DIAGNOSIS — N179 Acute kidney failure, unspecified: Secondary | ICD-10-CM

## 2019-07-08 DIAGNOSIS — R945 Abnormal results of liver function studies: Secondary | ICD-10-CM

## 2019-07-08 DIAGNOSIS — D649 Anemia, unspecified: Secondary | ICD-10-CM

## 2019-07-08 DIAGNOSIS — E876 Hypokalemia: Secondary | ICD-10-CM

## 2019-07-08 LAB — CBC WITH DIFFERENTIAL/PLATELET
Abs Immature Granulocytes: 0.02 10*3/uL (ref 0.00–0.07)
Basophils Absolute: 0.1 10*3/uL (ref 0.0–0.1)
Basophils Relative: 1 %
Eosinophils Absolute: 0.2 10*3/uL (ref 0.0–0.5)
Eosinophils Relative: 2 %
HCT: 34.1 % — ABNORMAL LOW (ref 36.0–46.0)
Hemoglobin: 10.6 g/dL — ABNORMAL LOW (ref 12.0–15.0)
Immature Granulocytes: 0 %
Lymphocytes Relative: 25 %
Lymphs Abs: 1.7 10*3/uL (ref 0.7–4.0)
MCH: 26.2 pg (ref 26.0–34.0)
MCHC: 31.1 g/dL (ref 30.0–36.0)
MCV: 84.2 fL (ref 80.0–100.0)
Monocytes Absolute: 0.8 10*3/uL (ref 0.1–1.0)
Monocytes Relative: 11 %
Neutro Abs: 4.1 10*3/uL (ref 1.7–7.7)
Neutrophils Relative %: 61 %
Platelets: 331 10*3/uL (ref 150–400)
RBC: 4.05 MIL/uL (ref 3.87–5.11)
RDW: 13.2 % (ref 11.5–15.5)
WBC: 6.8 10*3/uL (ref 4.0–10.5)
nRBC: 0 % (ref 0.0–0.2)

## 2019-07-08 LAB — COMPREHENSIVE METABOLIC PANEL
ALT: 61 U/L — ABNORMAL HIGH (ref 0–44)
AST: 41 U/L (ref 15–41)
Albumin: 3.5 g/dL (ref 3.5–5.0)
Alkaline Phosphatase: 52 U/L (ref 38–126)
Anion gap: 10 (ref 5–15)
BUN: 7 mg/dL (ref 6–20)
CO2: 22 mmol/L (ref 22–32)
Calcium: 8.9 mg/dL (ref 8.9–10.3)
Chloride: 115 mmol/L — ABNORMAL HIGH (ref 98–111)
Creatinine, Ser: 1.25 mg/dL — ABNORMAL HIGH (ref 0.44–1.00)
GFR calc Af Amer: >60 mL/min (ref >60)
GFR calc non Af Amer: 59 mL/min — ABNORMAL LOW (ref >60)
Glucose, Bld: 98 mg/dL (ref 70–99)
Potassium: 3.1 mmol/L — ABNORMAL LOW (ref 3.5–5.1)
Sodium: 147 mmol/L — ABNORMAL HIGH (ref 135–145)
Total Bilirubin: 1 mg/dL (ref 0.3–1.2)
Total Protein: 7.8 g/dL (ref 6.5–8.1)

## 2019-07-08 LAB — CK TOTAL AND CKMB (NOT AT ARMC)
CK, MB: 0.9 ng/mL (ref 0.5–5.0)
Relative Index: 0.1 (ref 0.0–2.5)
Total CK: 1270 U/L — ABNORMAL HIGH (ref 38–234)

## 2019-07-08 LAB — PHOSPHORUS: Phosphorus: 4.4 mg/dL (ref 2.5–4.6)

## 2019-07-08 LAB — MAGNESIUM: Magnesium: 1.8 mg/dL (ref 1.7–2.4)

## 2019-07-08 LAB — GLUCOSE, CAPILLARY: Glucose-Capillary: 96 mg/dL (ref 70–99)

## 2019-07-08 MED ORDER — MAGNESIUM SULFATE 2 GM/50ML IV SOLN
2.0000 g | Freq: Once | INTRAVENOUS | Status: AC
  Administered 2019-07-08: 2 g via INTRAVENOUS
  Filled 2019-07-08: qty 50

## 2019-07-08 MED ORDER — HYDRALAZINE HCL 20 MG/ML IJ SOLN
5.0000 mg | Freq: Four times a day (QID) | INTRAMUSCULAR | Status: DC | PRN
  Administered 2019-07-08 – 2019-07-09 (×2): 5 mg via INTRAVENOUS
  Filled 2019-07-08 (×2): qty 1

## 2019-07-08 MED ORDER — POTASSIUM CL IN DEXTROSE 5% 20 MEQ/L IV SOLN
20.0000 meq | INTRAVENOUS | Status: DC
  Administered 2019-07-08 – 2019-07-11 (×4): 20 meq via INTRAVENOUS
  Filled 2019-07-08 (×4): qty 1000

## 2019-07-08 MED ORDER — METHOCARBAMOL 1000 MG/10ML IJ SOLN
500.0000 mg | Freq: Three times a day (TID) | INTRAVENOUS | Status: DC | PRN
  Filled 2019-07-08 (×3): qty 5

## 2019-07-08 MED ORDER — LACTATED RINGERS IV BOLUS: 500.0000 mL | Freq: Once | INTRAVENOUS | Status: DC

## 2019-07-08 MED ORDER — POTASSIUM CHLORIDE 10 MEQ/100ML IV SOLN
10.0000 meq | INTRAVENOUS | Status: AC
  Administered 2019-07-08 (×4): 10 meq via INTRAVENOUS
  Filled 2019-07-08 (×3): qty 100

## 2019-07-08 NOTE — Progress Notes (Signed)
PROGRESS NOTE    Veronica Arias  ZOX:[id number] DOB: Dec 04, 1990 DOA: 07/01/2019 PCP: Patient, No Pcp Per   Brief Narrative:  HPI per Dr. John Giovanni on 07/05/2019 Veronica Arias is a 28 y.o. female with medical history significant of asthma who presented to the ED on 07/01/2019 for evaluation of suicidal ideation, paranoia, and bizarre behavior.  Patient had recent ED visit 2 days ago for similar behavior.  Per police report, she was found on the balcony of her apartment throwing things and saying she was suicidal and was going to jump.  On initial labs, no leukocytosis, blood ethanol level negative, acetaminophen level negative, salicylate level negative.  Transaminases elevated (AST 76, ALT 145), remainder of LFTs normal. Beta-hCG negative.  COVID 19 rapid test negative.  TSH normal.  UDS positive for benzodiazepines.  Labs done on 07/03/2019 showing CK 2998 and has continued to worsen since then.  On 8/13, CK 4536, blood glucose 63.  Blood glucose subsequently improved.  Today, CK uptrending 4011 > 4037.  Transaminases improved (AST 70, ALT 90).  Head CT done today showing no acute intracranial abnormality.  Patient has received a total of 6 L IV fluid boluses in the past 2 days.  However, CK continue's to trend up and patient is persistently tachycardic with heart rate up to 130s.  She has continued to be highly agitated throughout her ED stay for the past 4 days.Trying to pull out IV lines etc.  Refusing p.o.'s.  Patient has even managed to get out of soft restraints and has required security assistance to get her back to her room. Required multiple rounds of antipsychotics, benzodiazepines, diphenhydramine etc. for the past 4 days.  Due to CK uptrending, psychiatry recommended stopping antipsychotics/ Zyprexa as they may be causing rhabdo.  Accepted by inpatient psychiatry, awaiting bed placement and medical clearance.  Internal medicine teaching service was called and declined hospital admission.   [facility] callled to admit patient for mgmt of rhabdomyolysis, persistent tachycardia, agitation/paranoia/hallucinations.  Patient was seen and examined at bedside.  She was resting comfortably watching television.  Sitter at bedside.  Not answering any questions and no complaints  **Interim History  Patient's rhabdomyolysis is improving slowly however she still has an acute kidney injury.  Abnormal LFTs are trending down. IV fluids were changed to D5W from normal saline.  Psychiatry recommended stopping Depakote.  When she is stable for discharge she will go to inpatient psychiatric hospitalization at [facility].  EEG showed no abnormalities.  Assessment & Plan:   Principal Problem:   Rhabdomyolysis Active Problems:   Sinus tachycardia   Suicidal ideation   Psychosis (HCC)    Rhabdomyolysis Likely secondary to severe agitation with antipsychotic medication and possibly secondary to restraints applied for agitation -Continue to hold Depakote for now and use IV lorazepam -Continue with IV fluid hydration but will change IV normal saline with rate of 150 mL's per hour to D5 +20 mEq of KCl given the patient's hypernatremia as well as hyperchloremia -Continue to monitor CK levels levels renal function and electrolytes; CK is trending down to 1270 and at its highest was 4,536 -No clinical evidence of seizures but possibly pseudoseizures and obtain EEG to rule out and EEG showed a normal study within normal limits with no seizures or epileptiform discharges seen throughout the recording  Hypernatremia/Hyperchloremia -Patient sodium trended up to 147 and chloride 115 -We will change IV normal saline from 150 mL's per hour to D5W +20 mEq KCl at 75 mL's per hour -Continue  to monitor and trend -Repeat CMP in the a.m.  Hypokalemia -Patient's potassium this morning was 2.1 -Replete with IV KCl 40 mEq -Continue with IV fluids as above and D5W +20 mEq of KCl at 75 mL's per hour -Repeat CMP in the  a.m.  Abnormal LFTs -In the setting of rhabdomyolysis next-avoid hepatotoxic medications -Patient's AST is now 41 and ALT is now 61 -Trending downwards and peak AST was 81 and peak ALT was 146 -Continue to monitor and trend  -Consider obtaining a right upper quadrant ultrasound as well as acute hepatitis panel if not improving or worsening -Repeat CMP in a.m.  Persistent Sinus Tachycardia -No fever or rigidity to suggest neuroleptic malignant syndrome. -Assess for possible underlying infectious etiology: Afebrile and no leukocytosis.   -Chest x-ray not suggestive of pneumonia and showed "Low inspiratory volumes with bibasilar atelectasis. No significant interval change compared to earlier this morning."  -No nuchal rigidity/meningeal signs to suggest meningitis.   -UA, urine culture not done so will reorder.   -Blood culture x2 showed NGTD at 2 Days. -TSH was 1.823 -C/w Cardiac monitoring and Telemetry.  EKG in a.m. -Echocardiogram showed Normal EF and is below  -? Withdrawal from unknown substance   -IV Lopressor PRN HR >130 -Unclear if she has a history of alcohol abuse.  Will keep on CIWA protocol/Ativan PRN.  -UDS was Positive of Benzodiazepines  -Cardiology was consulted and Dr. Gaynelle Arabian feels that her persistent rates within the 100s 120s are is likely secondary to rhabdo and agitation psych aortic issues.  There is no structural heart disease on TTE and likely the treatment will be addressing underlying cause and no further cardiac work-up is recommended at this time -Dr. Gaynelle Arabian recommends readdressing if tachycardic once medical and psych issues are resolved  Suicidal Ideation, Agitation/Psychosis -Psychiatry was following and recommended stopping Depakote and continuing Ativan for now  -Plan to admit to inpatient psych after medical clearance at [facility] -Suicide precautions, sitter at bedside -Continue to Monitor and may need Psych to re-evaluate when improving   Renal  insufficiency/AKI -In the setting of rhabdomyolysis -Patient's BUN/creatinine went from 10/0.78 is now 7/1.25 -Continue to monitor and trend renal function -Continue with IV fluid hydration as above -Avoid nephrotoxic medications, contrast dyes, as well as hypotension -Repeat CMP in the a.m.  Normocytic Anemia -Likely dilutional drop in the setting of IV fluid resuscitation -Hemoglobin/hematocrit is now 10.6/34.1 -Check anemia panel in a.m. -Continue to monitor for signs and symptoms of bleeding; the no overt bleeding noted -Repeat CBC in a.m.  Obesity -Estimated body mass index is 35.43 kg/m as calculated from the following:   Height as of this encounter: 5\' 3"  (1.6 m).   Weight as of this encounter: 90.7 kg. -She is more awake we will provide weight loss counseling and dietary education  DVT prophylaxis: Enoxaparin 40 mg sq Daily  Code Status: FULL CODE  Family Communication: Attempted to call the mother on her cell phone and she did not pick up there is no family at bedside when I went Disposition Plan: Inpatient psychiatric hospitalization once she is medically stable and improved  Consultants:   Cardiology   Procedures:  ECHOCARDIOGRAM 07/06/2019 IMPRESSIONS    1. The left ventricle has normal systolic function with an ejection fraction of 60-65%. The cavity size was normal. Left ventricular diastolic parameters were normal.  2. The right ventricle has normal systolic function. The cavity was normal. There is no increase in right ventricular wall thickness.  3. The mitral  valve is grossly normal.  4. The tricuspid valve is grossly normal.  5. The aortic valve is tricuspid.  6. The aorta is normal unless otherwise noted.  FINDINGS  Left Ventricle: The left ventricle has normal systolic function, with an ejection fraction of 60-65%. The cavity size was normal. There is borderline increase in left ventricular wall thickness. Left ventricular diastolic parameters were  normal.  Right Ventricle: The right ventricle has normal systolic function. The cavity was normal. There is no increase in right ventricular wall thickness.  Left Atrium: Left atrial size was normal in size.  Right Atrium: Right atrial size was normal in size. Right atrial pressure is estimated at 8 mmHg.  Interatrial Septum: No atrial level shunt detected by color flow Doppler.  Pericardium: There is no evidence of pericardial effusion.  Mitral Valve: The mitral valve is grossly normal. Mitral valve regurgitation is not visualized by color flow Doppler.  Tricuspid Valve: The tricuspid valve is grossly normal. Tricuspid valve regurgitation was not visualized by color flow Doppler.  Aortic Valve: The aortic valve is tricuspid Aortic valve regurgitation was not visualized by color flow Doppler. There is no evidence of aortic valve stenosis.  Pulmonic Valve: The pulmonic valve was grossly normal. Pulmonic valve regurgitation is not visualized by color flow Doppler.  Aorta: The aorta is normal unless otherwise noted.  Venous: The inferior vena cava is normal in size with greater than 50% respiratory variability.    +--------------+--------++  LEFT VENTRICLE            +--------------+--------++  PLAX 2D                   +--------------+--------++  LVIDd:         4.10 cm    +--------------+--------++  LVIDs:         2.70 cm    +--------------+--------++  LV PW:         1.00 cm    +--------------+--------++  LV IVS:        1.00 cm    +--------------+--------++  LVOT diam:     1.90 cm    +--------------+--------++  LV SV:         47 ml      +--------------+--------++  LV SV Index:   24.13      +--------------+--------++  LVOT Area:     2.84 cm   +--------------+--------++                            +--------------+--------++  +---------------+----------++  RIGHT VENTRICLE              +---------------+----------++  RV S prime:     21.80  cm/s   +---------------+----------++  TAPSE (M-mode): 2.7 cm       +---------------+----------++  +---------------+-------++-----------++  LEFT ATRIUM              Index         +---------------+-------++-----------++  LA diam:        2.90 cm  1.56 cm/m    +---------------+-------++-----------++  LA Vol (A2C):   26.1 ml  14.00 ml/m   +---------------+-------++-----------++  LA Vol (A4C):   21.3 ml  11.43 ml/m   +---------------+-------++-----------++  LA Biplane Vol: 24.0 ml  12.87 ml/m   +---------------+-------++-----------++ +------------+--------++----------++  RIGHT ATRIUM           Index        +------------+--------++----------++  RA Area:  9.45 cm               +------------+--------++----------++  RA Volume:   16.00 ml  8.58 ml/m   +------------+--------++----------++  +------------+-----------++  AORTIC VALVE               +------------+-----------++  LVOT Vmax:   118.00 cm/s   +------------+-----------++  LVOT Vmean:  72.400 cm/s   +------------+-----------++  LVOT VTI:    0.179 m       +------------+-----------++   +-------------+-------++  AORTA                   +-------------+-------++  Ao Root diam: 2.40 cm   +-------------+-------++    +--------------+-------+  SHUNTS                  +--------------+-------+  Systemic VTI:  0.18 m   +--------------+-------+  Systemic Diam: 1.90 cm  +--------------+-------+  EEG Technical aspects: This EEG study was done with scalp electrodes positioned according to the 10-20 International system of electrode placement. Electrical activity was acquired at a sampling rate of 500Hz  and reviewed with a high frequency filter of 70Hz  and a low frequency filter of 1Hz . EEG data were recorded continuously and digitally stored.   DESCRIPTION: The posterior dominant rhythm consists of 9-10 Hz activity of moderate voltage (25-35 uV) seen predominantly in posterior head regions, symmetric and reactive to eye  opening and eye closing. There is an excessive amount of 15 to 18 Hz, 2-3 uV beta activity with irregular morphology distributed symmetrically and diffusely. Hyperventilation and photic stimulation were not performed.  IMPRESSION: This study is within normal limits. No seizures or epileptiform discharges were seen throughout the recording.   The excessive beta activity seen in the background is most likely due to the effect of benzodiazepine and is a benign EEG pattern.  Antimicrobials:  Anti-infectives (From admission, onward)   None     Subjective: Seen at bedside and she did not respond to my questioning and would just stare at me.  When the nurse sitter came and spoke to her she started saying a little bit to the nurse sitter but nothing to me.  She started shaking her hands vigorously and did not follow my commands.  No other concerns or complaints at this time  Objective: Vitals:   07/08/19 0500 07/08/19 0706 07/08/19 0740 07/08/19 1200  BP: (!) 164/103  (!) 155/110 (!) 158/104  Pulse: (!) 118  (!) 115 (!) 123  Resp: 18  16 18   Temp: 98.8 F (37.1 C)  98.2 F (36.8 C) 99.4 F (37.4 C)  TempSrc: Axillary  Oral Oral  SpO2: 98%  98% 100%  Weight:  90.7 kg    Height:        Intake/Output Summary (Last 24 hours) at 07/08/2019 1446 Last data filed at 07/08/2019 1231 Gross per 24 hour  Intake 440 ml  Output 600 ml  Net -160 ml   Filed Weights   07/06/19 0132 07/07/19 0337 07/08/19 0706  Weight: 82.2 kg 83.2 kg 90.7 kg   Examination: Physical Exam:  Constitutional: WN/WD obese African-American female currently NAD and appears agitated and a little uncomfortable Eyes: Lids and conjunctivae normal, sclerae anicteric  ENMT: External Ears, Nose appear normal. Grossly normal hearing.  Neck: Appears normal, supple, no cervical masses, normal ROM, no appreciable thyromegaly; no JVD Respiratory: Diminished to auscultation bilaterally, no wheezing, rales, rhonchi or crackles.  Normal respiratory effort and patient is not tachypenic. No accessory muscle  use.  Cardiovascular: Tachycardic rate but regular rhythm, no murmurs / rubs / gallops. S1 and S2 auscultated. No extremity edema.  Abdomen: Soft, non-tender, distended secondary to body habitus. No masses palpated. No appreciable hepatosplenomegaly. Bowel sounds positive x4.  GU: Deferred. Musculoskeletal: No clubbing / cyanosis of digits/nails.  Wearing soft mitten restraints Skin: No rashes, lesions, ulcers on limited skin evaluation. No induration; Warm and dry.  Neurologic: Did not follow commands and would stare at me and track with her eyes.  Vigorously started shaking her upper extremities and moves all extremities independently. Psychiatric: Impaired judgment and insight.  Appears very anxious  Data Reviewed: I have personally reviewed following labs and imaging studies  CBC: Recent Labs  Lab 07/03/19 2226 07/04/19 2204 07/05/19 1010 07/06/19 0455 07/07/19 0510 07/08/19 1008  WBC 10.1 9.8 10.9* 8.6 8.3 6.8  NEUTROABS 6.2  --  7.4  --   --  4.1  HGB 13.1 12.6 11.4* 10.7* 10.8* 10.6*  HCT 41.9 40.7 36.7 34.5* 34.4* 34.1*  MCV 82.8 85.3 84.8 85.0 83.3 84.2  PLT 398 383 361 329 331 331   Basic Metabolic Panel: Recent Labs  Lab 07/05/19 1722 07/06/19 0455 07/07/19 0510 07/07/19 2040 07/08/19 1008  NA 143 145 142 147* 147*  K 3.6 3.4* 2.9* 3.2* 3.1*  CL 113* 113* 109 114* 115*  CO2 19* 18* 22 20* 22  GLUCOSE 78 71 101* 76 98  BUN <5* 6 <5* 6 7  CREATININE 0.89 1.09* 1.04* 1.16* 1.25*  CALCIUM 8.8* 8.9 9.2 9.3 8.9  MG  --   --   --   --  1.8  PHOS  --  5.1*  --   --  4.4   GFR: Estimated Creatinine Clearance: 72.9 mL/min (A) (by C-G formula based on SCr of 1.25 mg/dL (H)). Liver Function Tests: Recent Labs  Lab 07/03/19 2226 07/04/19 2204 07/05/19 1722 07/06/19 0455 07/08/19 1008  AST 81* 79* 70* 57* 41  ALT 146* 121* 90* 79* 61*  ALKPHOS 59 57 50 42 52  BILITOT 1.0 1.3* 1.2 0.9  1.0  PROT 8.8* 8.3* 7.0 6.9 7.8  ALBUMIN 4.5 4.1 3.4* 3.4* 3.5   No results for input(s): LIPASE, AMYLASE in the last 168 hours. No results for input(s): AMMONIA in the last 168 hours. Coagulation Profile: No results for input(s): INR, PROTIME in the last 168 hours. Cardiac Enzymes: Recent Labs  Lab 07/05/19 1722 07/06/19 0455 07/06/19 0724 07/07/19 0510 07/08/19 1008  CKTOTAL 4,037* 2,968* 2,729* 2,221* 1,270*  CKMB  --   --   --   --  0.9   BNP (last 3 results) No results for input(s): PROBNP in the last 8760 hours. HbA1C: No results for input(s): HGBA1C in the last 72 hours. CBG: Recent Labs  Lab 07/05/19 0217 07/05/19 0614  GLUCAP 91 76   Lipid Profile: No results for input(s): CHOL, HDL, LDLCALC, TRIG, CHOLHDL, LDLDIRECT in the last 72 hours. Thyroid Function Tests: No results for input(s): TSH, T4TOTAL, FREET4, T3FREE, THYROIDAB in the last 72 hours. Anemia Panel: No results for input(s): VITAMINB12, FOLATE, FERRITIN, TIBC, IRON, RETICCTPCT in the last 72 hours. Sepsis Labs: No results for input(s): PROCALCITON, LATICACIDVEN in the last 168 hours.  Recent Results (from the past 240 hour(s))  SARS Coronavirus 2 [facility](Hospital order, Performed in [facility]) Nasopharyngeal Nasopharyngeal Swab     Status: None   Collection Time: 07/01/19  9:46 PM   Specimen: Nasopharyngeal Swab  Result Value Ref Range Status  SARS Coronavirus 2 NEGATIVE NEGATIVE Final    Comment: (NOTE) If result is NEGATIVE SARS-CoV-2 target nucleic acids are NOT DETECTED. The SARS-CoV-2 RNA is generally detectable in upper and lower  respiratory specimens during the acute phase of infection. The lowest  concentration of SARS-CoV-2 viral copies this assay can detect is 250  copies / mL. A negative result does not preclude SARS-CoV-2 infection  and should not be used as the sole basis for treatment or other  patient management decisions.  A negative result may occur with  improper  specimen collection / handling, submission of specimen other  than nasopharyngeal swab, presence of viral mutation(s) within the  areas targeted by this assay, and inadequate number of viral copies  (<250 copies / mL). A negative result must be combined with clinical  observations, patient history, and epidemiological information. If result is POSITIVE SARS-CoV-2 target nucleic acids are DETECTED. The SARS-CoV-2 RNA is generally detectable in upper and lower  respiratory specimens dur ing the acute phase of infection.  Positive  results are indicative of active infection with SARS-CoV-2.  Clinical  correlation with patient history and other diagnostic information is  necessary to determine patient infection status.  Positive results do  not rule out bacterial infection or co-infection with other viruses. If result is PRESUMPTIVE POSTIVE SARS-CoV-2 nucleic acids MAY BE PRESENT.   A presumptive positive result was obtained on the submitted specimen  and confirmed on repeat testing.  While 2019 novel coronavirus  (SARS-CoV-2) nucleic acids may be present in the submitted sample  additional confirmatory testing may be necessary for epidemiological  and / or clinical management purposes  to differentiate between  SARS-CoV-2 and other Sarbecovirus currently known to infect humans.  If clinically indicated additional testing with an alternate test  methodology [id number]) is advised. The SARS-CoV-2 RNA is generally  detectable in upper and lower respiratory sp ecimens during the acute  phase of infection. The expected result is Negative. Fact Sheet for Patients:  StrictlyIdeas.no Fact Sheet for Healthcare Providers: BankingDealers.co.za This test is not yet approved or cleared by the [place] FDA and has been authorized for detection and/or diagnosis of SARS-CoV-2 by FDA under an Emergency Use Authorization (EUA).  This EUA will remain in  effect (meaning this test can be used) for the duration of the COVID-19 declaration under Section 564(b)(1) of the Act, 21 U.S.C. section 360bbb-3(b)(1), unless the authorization is terminated or revoked sooner. Performed at [facility], [place] [address], [place], [place] [address]   Culture, blood (routine x 2)     Status: None (Preliminary result)   Collection Time: 07/06/19 12:20 AM   Specimen: BLOOD  Result Value Ref Range Status   Specimen Description BLOOD RIGHT ARM  Final   Special Requests   Final    BOTTLES DRAWN AEROBIC AND ANAEROBIC Blood Culture adequate volume   Culture   Final    NO GROWTH 2 DAYS Performed at [facility], [place] [address] [place], [place] [address]    Report Status PENDING  Incomplete  Culture, blood (routine x 2)     Status: None (Preliminary result)   Collection Time: 07/06/19 12:35 AM   Specimen: BLOOD  Result Value Ref Range Status   Specimen Description BLOOD RIGHT HAND  Final   Special Requests   Final    BOTTLES DRAWN AEROBIC AND ANAEROBIC Blood Culture results may not be optimal due to an inadequate volume of blood received in culture bottles  Culture   Final    NO GROWTH 2 DAYS Performed at [facility], [address] [place], [place] [address]    Report Status PENDING  Incomplete    Radiology Studies: No results found.  Scheduled Meds:  enoxaparin (LOVENOX) injection  40 mg Subcutaneous Daily   folic acid  1 mg Oral Daily   multivitamin with minerals  1 tablet Oral Daily   nicotine  21 mg Transdermal Q0600   thiamine  100 mg Oral Daily   Or   thiamine  100 mg Intravenous Daily   Continuous Infusions:  sodium chloride 150 mL/hr at 07/08/19 0821    LOS: 2 days    Merlene Laughtermair Latif Shanigua Gibb, DO [facility] PAGER is on AMION  If 7PM-7AM, please contact night-coverage [email or web address] Password [email or web address] 07/08/2019, 2:46 PM

## 2019-07-08 NOTE — Progress Notes (Signed)
Progress Note  Patient Name: Veronica Arias Date of Encounter: 07/08/2019  Primary Cardiologist: No primary care provider on file.   Subjective   Did not respond verbally to my questions, but did nod head to yes/no questions and denies any shortness of breath or chest pain.    Inpatient Medications    Scheduled Meds: . enoxaparin (LOVENOX) injection  40 mg Subcutaneous Daily  . folic acid  1 mg Oral Daily  . multivitamin with minerals  1 tablet Oral Daily  . nicotine  21 mg Transdermal Q0600  . thiamine  100 mg Oral Daily   Or  . thiamine  100 mg Intravenous Daily   Continuous Infusions: . sodium chloride 150 mL/hr at 07/08/19 0821   PRN Meds: acetaminophen, alum & mag hydroxide-simeth, LORazepam **OR** LORazepam, LORazepam, magnesium hydroxide, metoprolol tartrate   Vital Signs    Vitals:   07/08/19 0030 07/08/19 0500 07/08/19 0706 07/08/19 0740  BP: (!) 155/96 (!) 164/103  (!) 155/110  Pulse: (!) 120 (!) 118  (!) 115  Resp: 20 18  16   Temp: 98.6 F (37 C) 98.8 F (37.1 C)  98.2 F (36.8 C)  TempSrc: Oral Axillary  Oral  SpO2: 97% 98%  98%  Weight:   90.7 kg   Height:        Intake/Output Summary (Last 24 hours) at 07/08/2019 0953 Last data filed at 07/07/2019 2137 Gross per 24 hour  Intake 90.99 ml  Output 600 ml  Net -509.01 ml   Last 3 Weights 07/08/2019 07/07/2019 07/06/2019  Weight (lbs) 200 lb 183 lb 8 oz 181 lb 3.5 oz  Weight (kg) 90.719 kg 83.235 kg 82.2 kg      Telemetry    Sinus tachycardia rate 100-120s - Personally Reviewed  ECG    Sinus tachycardia, rate 106 - Personally Reviewed  Physical Exam   GEN: No acute distress.   Neck: No JVD Cardiac: regular, tachycardic, no murmurs, rubs, or gallops.  Respiratory: Clear to auscultation bilaterally. GI: Soft, nontender MS: No edema Neuro:  No focal deficits Psych: Flat affect, not answering questions  Labs    High Sensitivity Troponin:  No results for input(s): TROPONINIHS in the last  720 hours.    Cardiac EnzymesNo results for input(s): TROPONINI in the last 168 hours. No results for input(s): TROPIPOC in the last 168 hours.   Chemistry Recent Labs  Lab 07/04/19 2204  07/05/19 1722 07/06/19 0455 07/07/19 0510 07/07/19 2040  NA 135   < > 143 145 142 147*  K 3.7   < > 3.6 3.4* 2.9* 3.2*  CL 102   < > 113* 113* 109 114*  CO2 17*   < > 19* 18* 22 20*  GLUCOSE 63*   < > 78 71 101* 76  BUN 5*   < > <5* 6 <5* 6  CREATININE 1.05*   < > 0.89 1.09* 1.04* 1.16*  CALCIUM 9.4   < > 8.8* 8.9 9.2 9.3  PROT 8.3*  --  7.0 6.9  --   --   ALBUMIN 4.1  --  3.4* 3.4*  --   --   AST 79*  --  70* 57*  --   --   ALT 121*  --  90* 79*  --   --   ALKPHOS 57  --  50 42  --   --   BILITOT 1.3*  --  1.2 0.9  --   --   GFRNONAA >60   < > >  60 >60 >60 >60  GFRAA >60   < > >60 >60 >60 >60  ANIONGAP 16*   < > 11 14 11 13    < > = values in this interval not displayed.     Hematology Recent Labs  Lab 07/05/19 1010 07/06/19 0455 07/07/19 0510  WBC 10.9* 8.6 8.3  RBC 4.33 4.06 4.13  HGB 11.4* 10.7* 10.8*  HCT 36.7 34.5* 34.4*  MCV 84.8 85.0 83.3  MCH 26.3 26.4 26.2  MCHC 31.1 31.0 31.4  RDW 12.8 12.9 13.1  PLT 361 329 331    BNPNo results for input(s): BNP, PROBNP in the last 168 hours.   DDimer No results for input(s): DDIMER in the last 168 hours.   Radiology    Dg Chest Port 1 View  Result Date: 07/06/2019 CLINICAL DATA:  28 year old female with dyspnea EXAM: PORTABLE CHEST 1 VIEW COMPARISON:  Prior chest x-ray 07/06/2019 FINDINGS: No interval change in the appearance of the chest compared to the chest x-ray from earlier today. Persistent low inspiratory volumes with bibasilar atelectasis. No new airspace consolidation, pulmonary edema, pneumothorax or pleural effusion. No acute osseous abnormality. IMPRESSION: Low inspiratory volumes with bibasilar atelectasis. No significant interval change compared to earlier this morning. Electronically Signed   By: Malachy MoanHeath  McCullough  M.D.   On: 07/06/2019 14:06    Cardiac Studies   TTE 8/16: personally reviewed: normal LV and RV function, no valvular disease  Patient Profile     28 y.o. female with a history of asthma who presented with suicidal ideation and rhabdomyolysis and is being seen today for the evaluation of tachycardia.  Assessment & Plan    Sinus tachycardia: persistent with rates 100-120s.  Suspect 2/2 rhabdo and agitation/psych issues.  No structural heart disease on TTE. Treatment will be addressing underlying cause, no further cardiac work-up recommended at this time.  Can readdress if remains tachycardic once medical/psych issues resolved.        [facility] will sign off.   Medication Recommendations:  None Other recommendations (labs, testing, etc):  None  Follow up as an outpatient:  As needed  For questions or updates, please contact [facility] Please consult [email or web address] for contact info under        Signed, Little Ishikawahristopher L Ruble Pumphrey, MD  07/08/2019, 9:53 AM

## 2019-07-08 NOTE — BH Assessment (Signed)
Received call from staff at [facility] who said their physician would like a repeat CK with results faxed to [phone].   Evelena Peat, [facility], [facility], [facility] Triage Specialist [phone]

## 2019-07-08 NOTE — Procedures (Addendum)
Patient Name: Veronica Arias  MRN: [id number]  Epilepsy Attending: Lora Havens  Referring Physician/Provider: Dr Laverna Peace Date: 07/08/2019 Duration: 23.50 mins  Patient history: 28yo F with bizzare behavior and rhabdomyolysis. EEG to evaluate for seizures.  Level of alertness: awake  Technical aspects: This EEG study was done with scalp electrodes positioned according to the 10-20 International system of electrode placement. Electrical activity was acquired at a sampling rate of 500Hz  and reviewed with a high frequency filter of 70Hz  and a low frequency filter of 1Hz . EEG data were recorded continuously and digitally stored.   DESCRIPTION: The posterior dominant rhythm consists of 9-10 Hz activity of moderate voltage (25-35 uV) seen predominantly in posterior head regions, symmetric and reactive to eye opening and eye closing. There is an excessive amount of 15 to 18 Hz, 2-3 uV beta activity with irregular morphology distributed symmetrically and diffusely. Hyperventilation and photic stimulation were not performed.  IMPRESSION: This study is within normal limits. No seizures or epileptiform discharges were seen throughout the recording.   The excessive beta activity seen in the background is most likely due to the effect of benzodiazepine and is a benign EEG pattern.   Keanthony Poole [name]

## 2019-07-08 NOTE — Progress Notes (Signed)
Pt. Asleep during the night. Awake now. Agitation noted. Pt. Restless. Sitter stated pt. Was standing up in the middle of the bed while voiding. Pt. Also attempting to get out of low bed. PRN ativan given. RN will continue to monitor pt.

## 2019-07-08 NOTE — Progress Notes (Addendum)
Pt has been having menses for 3 days. Pt refuses medication by mouth. No pain medication IV for pain.   Pt is currently holding her lower abdomen and heating pads in place.  Pt's mother is requesting something for pain.  Sheikh MD paged.

## 2019-07-08 NOTE — Progress Notes (Signed)
   Vital Signs MEWS/VS Documentation      07/07/2019 2145 07/08/2019 0030 07/08/2019 0500 07/08/2019 0740   MEWS Score:  3  3  3  3    MEWS Score Color:  Yellow  Yellow  Yellow  Yellow   Resp:  -  20  18  16    Pulse:  -  (!) 120  (!) 118  (!) 115   BP:  -  (!) 155/96  (!) 164/103  (!) 155/110   Temp:  -  98.6 F (37 C)  98.8 F (37.1 C)  98.2 F (36.8 C)   O2 Device:  -  Room [employer]  Room Air   Level of Consciousness:  Responds to [place] 07/08/2019,7:56 AM

## 2019-07-08 NOTE — Progress Notes (Signed)
EEG complete - results pending 

## 2019-07-08 NOTE — Progress Notes (Signed)
Called by charge RN d/t MEWS-4. Pt HR-131, and LOC decreased. At this time, pt is combative with staff, oriented only to self, will not follow commands, and is growling intermittently. Pt here with suicidal ideation, paranoia, and bizarre behavior and has had multiple episodes of agitation like this since admission. Pt has no IV access because pt has pulled out all IVs and security is at bedside for safety purposes. RRT attempted to start new PIV with no success. Ativan given IM per order and IV team to bedside to place new PIV. PIV placed and pt IVF restarted. Pt calmer after interventions. Pt's HR-117 and MEWS is now 3. Pt MEWS has been 3 off and on since admission. Please call RRT if further assistance needed.

## 2019-07-08 NOTE — Progress Notes (Signed)
Spoke with patient's mother and father Jakaylah Schlafer and Mariafernanda Hendricksen) regarding plan of care.  Requested for MD to call them.  Secure chat sent to MD to make aware.

## 2019-07-09 ENCOUNTER — Inpatient Hospital Stay (HOSPITAL_COMMUNITY): Payer: BC Managed Care – PPO

## 2019-07-09 DIAGNOSIS — R338 Other retention of urine: Secondary | ICD-10-CM

## 2019-07-09 LAB — URINALYSIS, ROUTINE W REFLEX MICROSCOPIC
Bacteria, UA: NONE SEEN
Bilirubin Urine: NEGATIVE
Glucose, UA: NEGATIVE mg/dL
Ketones, ur: NEGATIVE mg/dL
Leukocytes,Ua: NEGATIVE
Nitrite: NEGATIVE
Protein, ur: NEGATIVE mg/dL
Specific Gravity, Urine: 1.006 (ref 1.005–1.030)
pH: 5 (ref 5.0–8.0)

## 2019-07-09 LAB — CBC WITH DIFFERENTIAL/PLATELET
Abs Immature Granulocytes: 0.02 10*3/uL (ref 0.00–0.07)
Abs Immature Granulocytes: 0.02 10*3/uL (ref 0.00–0.07)
Basophils Absolute: 0.1 10*3/uL (ref 0.0–0.1)
Basophils Absolute: 0.1 10*3/uL (ref 0.0–0.1)
Basophils Relative: 1 %
Basophils Relative: 1 %
Eosinophils Absolute: 0.1 10*3/uL (ref 0.0–0.5)
Eosinophils Absolute: 0.2 10*3/uL (ref 0.0–0.5)
Eosinophils Relative: 2 %
Eosinophils Relative: 2 %
HCT: 34.5 % — ABNORMAL LOW (ref 36.0–46.0)
HCT: 36.9 % (ref 36.0–46.0)
Hemoglobin: 10.4 g/dL — ABNORMAL LOW (ref 12.0–15.0)
Hemoglobin: 11.3 g/dL — ABNORMAL LOW (ref 12.0–15.0)
Immature Granulocytes: 0 %
Immature Granulocytes: 0 %
Lymphocytes Relative: 23 %
Lymphocytes Relative: 24 %
Lymphs Abs: 1.5 10*3/uL (ref 0.7–4.0)
Lymphs Abs: 2 10*3/uL (ref 0.7–4.0)
MCH: 25.7 pg — ABNORMAL LOW (ref 26.0–34.0)
MCH: 25.7 pg — ABNORMAL LOW (ref 26.0–34.0)
MCHC: 30.1 g/dL (ref 30.0–36.0)
MCHC: 30.6 g/dL (ref 30.0–36.0)
MCV: 85.4 fL (ref 80.0–100.0)
MCV: 84.1 fL (ref 80.0–100.0)
Monocytes Absolute: 0.7 10*3/uL (ref 0.1–1.0)
Monocytes Absolute: 0.7 10*3/uL (ref 0.1–1.0)
Monocytes Relative: 10 %
Monocytes Relative: 9 %
Neutro Abs: 4.3 10*3/uL (ref 1.7–7.7)
Neutro Abs: 5.1 10*3/uL (ref 1.7–7.7)
Neutrophils Relative %: 64 %
Neutrophils Relative %: 64 %
Platelets: 364 10*3/uL (ref 150–400)
Platelets: 416 10*3/uL — ABNORMAL HIGH (ref 150–400)
RBC: 4.04 MIL/uL (ref 3.87–5.11)
RBC: 4.39 MIL/uL (ref 3.87–5.11)
RDW: 13.5 % (ref 11.5–15.5)
RDW: 13.4 % (ref 11.5–15.5)
WBC: 6.8 10*3/uL (ref 4.0–10.5)
WBC: 8.1 10*3/uL (ref 4.0–10.5)
nRBC: 0 % (ref 0.0–0.2)
nRBC: 0 % (ref 0.0–0.2)

## 2019-07-09 LAB — IRON AND TIBC
Iron: 25 ug/dL — ABNORMAL LOW (ref 28–170)
Saturation Ratios: 8 % — ABNORMAL LOW (ref 10.4–31.8)
TIBC: 329 ug/dL (ref 250–450)
UIBC: 304 ug/dL

## 2019-07-09 LAB — COMPREHENSIVE METABOLIC PANEL
ALT: 57 U/L — ABNORMAL HIGH (ref 0–44)
ALT: 65 U/L — ABNORMAL HIGH (ref 0–44)
AST: 37 U/L (ref 15–41)
AST: 46 U/L — ABNORMAL HIGH (ref 15–41)
Albumin: 3.6 g/dL (ref 3.5–5.0)
Albumin: 3.7 g/dL (ref 3.5–5.0)
Alkaline Phosphatase: 52 U/L (ref 38–126)
Alkaline Phosphatase: 57 U/L (ref 38–126)
Anion gap: 11 (ref 5–15)
Anion gap: 13 (ref 5–15)
BUN: 9 mg/dL (ref 6–20)
BUN: 8 mg/dL (ref 6–20)
CO2: 22 mmol/L (ref 22–32)
CO2: 24 mmol/L (ref 22–32)
Calcium: 9 mg/dL (ref 8.9–10.3)
Calcium: 9.4 mg/dL (ref 8.9–10.3)
Chloride: 113 mmol/L — ABNORMAL HIGH (ref 98–111)
Chloride: 109 mmol/L (ref 98–111)
Creatinine, Ser: 1.49 mg/dL — ABNORMAL HIGH (ref 0.44–1.00)
Creatinine, Ser: 1.37 mg/dL — ABNORMAL HIGH (ref 0.44–1.00)
GFR calc Af Amer: 56 mL/min — ABNORMAL LOW (ref >60)
GFR calc Af Amer: >60 mL/min (ref >60)
GFR calc non Af Amer: 48 mL/min — ABNORMAL LOW (ref >60)
GFR calc non Af Amer: 53 mL/min — ABNORMAL LOW (ref >60)
Glucose, Bld: 119 mg/dL — ABNORMAL HIGH (ref 70–99)
Glucose, Bld: 114 mg/dL — ABNORMAL HIGH (ref 70–99)
Potassium: 3.2 mmol/L — ABNORMAL LOW (ref 3.5–5.1)
Potassium: 3 mmol/L — ABNORMAL LOW (ref 3.5–5.1)
Sodium: 146 mmol/L — ABNORMAL HIGH (ref 135–145)
Sodium: 146 mmol/L — ABNORMAL HIGH (ref 135–145)
Total Bilirubin: 0.7 mg/dL (ref 0.3–1.2)
Total Bilirubin: 0.8 mg/dL (ref 0.3–1.2)
Total Protein: 7.4 g/dL (ref 6.5–8.1)
Total Protein: 7.7 g/dL (ref 6.5–8.1)

## 2019-07-09 LAB — PHOSPHORUS
Phosphorus: 4.8 mg/dL — ABNORMAL HIGH (ref 2.5–4.6)
Phosphorus: 4.9 mg/dL — ABNORMAL HIGH (ref 2.5–4.6)

## 2019-07-09 LAB — RAPID URINE DRUG SCREEN, HOSP PERFORMED
Amphetamines: NOT DETECTED
Barbiturates: NOT DETECTED
Benzodiazepines: POSITIVE — AB
Cocaine: NOT DETECTED
Opiates: NOT DETECTED
Tetrahydrocannabinol: NOT DETECTED

## 2019-07-09 LAB — FERRITIN: Ferritin: 66 ng/mL (ref 11–307)

## 2019-07-09 LAB — RETICULOCYTES
Immature Retic Fract: 6.3 % (ref 2.3–15.9)
RBC.: 4.04 MIL/uL (ref 3.87–5.11)
Retic Count, Absolute: 46.9 10*3/uL (ref 19.0–186.0)
Retic Ct Pct: 1.2 % (ref 0.4–3.1)

## 2019-07-09 LAB — MAGNESIUM
Magnesium: 2 mg/dL (ref 1.7–2.4)
Magnesium: 1.6 mg/dL — ABNORMAL LOW (ref 1.7–2.4)

## 2019-07-09 LAB — CREATININE, URINE, RANDOM: Creatinine, Urine: 89.18 mg/dL

## 2019-07-09 LAB — LACTIC ACID, PLASMA
Lactic Acid, Venous: 0.8 mmol/L (ref 0.5–1.9)
Lactic Acid, Venous: 1 mmol/L (ref 0.5–1.9)

## 2019-07-09 LAB — CK: Total CK: 947 U/L — ABNORMAL HIGH (ref 38–234)

## 2019-07-09 LAB — OSMOLALITY, URINE: Osmolality, Ur: 205 mOsm/kg — ABNORMAL LOW (ref 300–900)

## 2019-07-09 LAB — PROCALCITONIN: Procalcitonin: <0.1 ng/mL

## 2019-07-09 LAB — FOLATE: Folate: 17.9 ng/mL (ref >5.9)

## 2019-07-09 LAB — VITAMIN B12: Vitamin B-12: 1033 pg/mL — ABNORMAL HIGH (ref 180–914)

## 2019-07-09 LAB — SODIUM, URINE, RANDOM: Sodium, Ur: 74 mmol/L

## 2019-07-09 MED ORDER — HYDRALAZINE HCL 20 MG/ML IJ SOLN
10.0000 mg | Freq: Four times a day (QID) | INTRAMUSCULAR | Status: DC | PRN
  Administered 2019-07-09 – 2019-07-18 (×5): 10 mg via INTRAVENOUS
  Filled 2019-07-09 (×5): qty 1

## 2019-07-09 MED ORDER — SODIUM CHLORIDE 0.9 % IV BOLUS
1000.0000 mL | Freq: Once | INTRAVENOUS | Status: AC
  Administered 2019-07-09: 1000 mL via INTRAVENOUS

## 2019-07-09 MED ORDER — SODIUM CHLORIDE 0.9 % IV SOLN
3.0000 g | Freq: Three times a day (TID) | INTRAVENOUS | Status: AC
  Administered 2019-07-09 – 2019-07-16 (×22): 3 g via INTRAVENOUS
  Filled 2019-07-09 (×4): qty 3
  Filled 2019-07-09: qty 8
  Filled 2019-07-09 (×11): qty 3
  Filled 2019-07-09 (×2): qty 8
  Filled 2019-07-09: qty 3
  Filled 2019-07-09 (×3): qty 8
  Filled 2019-07-09 (×2): qty 3

## 2019-07-09 MED ORDER — HYDRALAZINE HCL 20 MG/ML IJ SOLN
10.0000 mg | Freq: Once | INTRAMUSCULAR | Status: AC
  Administered 2019-07-09: 10 mg via INTRAVENOUS
  Filled 2019-07-09: qty 1

## 2019-07-09 MED ORDER — SODIUM CHLORIDE 0.9 % IV BOLUS
500.0000 mL | Freq: Once | INTRAVENOUS | Status: AC
  Administered 2019-07-09: 500 mL via INTRAVENOUS

## 2019-07-09 MED ORDER — POTASSIUM CHLORIDE 10 MEQ/100ML IV SOLN
10.0000 meq | INTRAVENOUS | Status: AC
  Administered 2019-07-09 (×4): 10 meq via INTRAVENOUS
  Filled 2019-07-09 (×4): qty 100

## 2019-07-09 NOTE — Plan of Care (Signed)

## 2019-07-09 NOTE — Progress Notes (Signed)
All 4 runs of KCL have infused,patient conts with IV hydration due to poor po intake. She has talked one to 2 word responses to mom. Will shake her head yes or no to questions only at present. She offers little conversation at present. In her sleep picks at lines and tubes still trying to remove.

## 2019-07-09 NOTE — Progress Notes (Signed)
Pharmacy Antibiotic Note  Veronica Arias is a 28 y.o. female admitted on 07/01/2019 with psychosis and rhabdomyolysis now with concern for aspiration pneumonia.  Pharmacy has been consulted for Unasyn dosing.  WBC wnl, though patient spiking temp of 100.6 CXR today following episodes of shortness of breath and unresponsiveness after receiving ativan.   Plan: Unasyn 3 g IV q8h Monitor cultures, renal function, length of therapy Follow up CXR results  Height: 5\' 3"  (160 cm) Weight: 205 lb (93 kg) IBW/kg (Calculated) : 52.4  Temp (24hrs), Avg:99.5 F (37.5 C), Min:98.6 F (37 C), Max:100.6 F (38.1 C)  Recent Labs  Lab 07/05/19 1010  07/06/19 0455 07/07/19 0510 07/07/19 2040 07/08/19 1008 07/09/19 0438  WBC 10.9*  --  8.6 8.3  --  6.8 6.8  CREATININE 0.82   < > 1.09* 1.04* 1.16* 1.25* 1.49*   < > = values in this interval not displayed.    Estimated Creatinine Clearance: 62 mL/min (A) (by C-G formula based on SCr of 1.49 mg/dL (H)).    No Known Allergies  Antimicrobials this admission: Unasyn 8/18 >>  Dose adjustments this admission: None  Microbiology results: 8/10 COVID: neg 8/15 BCx: ngtd 8/18 UCx: sent  Thank you for allowing pharmacy to be a part of this patient's care.  Ronna Polio 07/09/2019 6:45 PM

## 2019-07-09 NOTE — Progress Notes (Signed)
Spiked a temperature of 100.6 likely the reason her heart rate is elevated.  Likely patient may have aspirated given that she had bit her tongue this morning and was somnolent when I saw her.  We will elevate the head of the bed, obtain infectious markers and repeat blood cultures as well as inflammatory markers.  Blood cultures x2, urinalysis, chest x-ray have all been ordered and pending.  Will empirically start the patient on IV Unasyn.  There is no real concern for neuroleptic malignant syndrome as she has not been on any serotonergic agents and has not been increasingly rigid.  Depakote has been stopped previously.  I spoke with Neurology Dr. Lorraine Lax feels that since she has been here for 9 days does not the clinical picture of meningitis.  Will need to continue to monitor carefully and follow-up on cultures and monitor clinical response to intervention.  We will make sure the head of the bed is elevated, obtain SLP evaluation as well as place the patient on aspiration precautions.

## 2019-07-09 NOTE — Progress Notes (Signed)
Md informed of elevated heart rate and low grade temp 100.6. Patient's bp has improved with prn hydrazline given for elevated bp and prn lopressor for elevated heart rate. She has received a fluid bolus for possible dehydration due to not eating and drinking very well.

## 2019-07-09 NOTE — Progress Notes (Signed)
MD informed of Mews score 4 reported HR and BP has increased since this am would increase patient's bp med and order fluid bolus see that would help. No further changes noted.

## 2019-07-09 NOTE — Consult Note (Signed)
Attempted to see patient for televisit with iPad. Informed by Network engineer that nurse was on lunch break. Left contact information for nurse to complete televisit when she was available.   Buford Dresser, DO 07/09/19 4:25 PM

## 2019-07-09 NOTE — Progress Notes (Signed)
Reported elevated MeWS score to MD see MD notes proprosed new treatment plan forward. She is being pan cultured,new PCXR ordered etc. Mom is at bedside is aware of above patient is being placed on aspiration preacautions as well. Oncoming nurse informed of above.

## 2019-07-09 NOTE — Plan of Care (Signed)

## 2019-07-09 NOTE — Progress Notes (Signed)
PROGRESS NOTE    Veronica Arias  GNF:[id number] DOB: 09-Aug-1991 DOA: 07/01/2019 PCP: Patient, No Pcp Per   Brief Narrative:  HPI per Dr. John Giovanni on 07/05/2019 Veronica Arias is a 28 y.o. female with medical history significant of asthma who presented to the ED on 07/01/2019 for evaluation of suicidal ideation, paranoia, and bizarre behavior.  Patient had recent ED visit 2 days ago for similar behavior.  Per police report, she was found on the balcony of her apartment throwing things and saying she was suicidal and was going to jump.  On initial labs, no leukocytosis, blood ethanol level negative, acetaminophen level negative, salicylate level negative.  Transaminases elevated (AST 76, ALT 145), remainder of LFTs normal. Beta-hCG negative.  COVID 19 rapid test negative.  TSH normal.  UDS positive for benzodiazepines.  Labs done on 07/03/2019 showing CK 2998 and has continued to worsen since then.  On 8/13, CK 4536, blood glucose 63.  Blood glucose subsequently improved.  Today, CK uptrending 4011 > 4037.  Transaminases improved (AST 70, ALT 90).  Head CT done today showing no acute intracranial abnormality.  Patient has received a total of 6 L IV fluid boluses in the past 2 days.  However, CK continue's to trend up and patient is persistently tachycardic with heart rate up to 130s.  She has continued to be highly agitated throughout her ED stay for the past 4 days.Trying to pull out IV lines etc.  Refusing p.o.'s.  Patient has even managed to get out of soft restraints and has required security assistance to get her back to her room. Required multiple rounds of antipsychotics, benzodiazepines, diphenhydramine etc. for the past 4 days.  Due to CK uptrending, psychiatry recommended stopping antipsychotics/ Zyprexa as they may be causing rhabdo.  Accepted by inpatient psychiatry, awaiting bed placement and medical clearance.  Internal medicine teaching service was called and declined hospital admission.   [facility] call[facility] to admit patient for mgmt of rhabdomyolysis, persistent tachycardia, agitation/paranoia/hallucinations.  Patient was seen and examined at bedside.  She was resting comfortably watching television.  Sitter at bedside.  Not answering any questions and no complaints  **Interim History  Patient's rhabdomyolysis is improving slowly however she still has an acute kidney injury.  Abnormal LFTs are trending down. IV fluids were changed to D5W from normal saline.  Renal function started worsening despite IV fluid hydration so obtained a bladder scan and showed greater than 997 mL's and renal ultrasound was also obtained which was normal.  Foley catheter was placed and she drained out immediately 2 L.  We will continue Foley catheter for drainage. Psychiatry recommended stopping Depakote.  When she is stable for discharge she will go to inpatient psychiatric hospitalization at [facility].  EEG showed no abnormalities.  Overnight the patient became extremely agitated and combative and was kicking staff and jumped out of bed and she attempted to attack staff nurse and ripped out her peripheral IV.  She is given a dose of Ativan this morning she is sleeping I walked in and really responding appropriately.  Assessment & Plan:   Principal Problem:   Rhabdomyolysis Active Problems:   Sinus tachycardia   Suicidal ideation   Psychosis (HCC)    Rhabdomyolysis Likely secondary to severe agitation with antipsychotic medication and possibly secondary to restraints applied for agitation -Continue to hold Depakote for now and use IV lorazepam -Continue with IV fluid hydration but will change IV normal saline with rate of 150 mL's per hour to D5 +20  mEq of KCl given the patient's hypernatremia as well as hyperchloremia -Continue to monitor CK levels levels renal function and electrolytes; CK is trending down to 947 and at its highest was 4,536 -No clinical evidence of seizures but possibly pseudoseizures and  obtain EEG to rule out and EEG showed a normal study within normal limits with no seizures or epileptiform discharges seen throughout the recording  Hypernatremia/Hyperchloremia slowly improving -Patient sodium trended up to 147 and chloride 115; today sodium is now 146 and chloride is 113 -Changed IV normal saline from 150 mL's per hour to D5W +20 mEq KCl at 75 mL's per hour -Continue to monitor and trend -Repeat CMP in the a.m.  Hypokalemia -Patient's potassium this morning was 3.2 -Replete with IV KCl 40 mEq -Continue with IV fluids as above and D5W +20 mEq of KCl at 75 mL's per hour -Repeat CMP in the a.m.  Abnormal LFTs, trending down -In the setting of rhabdomyolysis next-avoid hepatotoxic medications -Patient's AST is now 37 and ALT is now 57 -Trending downwards and peak AST was 81 and peak ALT was 146 -Continue to monitor and trend  -Consider obtaining a right upper quadrant ultrasound as well as acute hepatitis panel if not improving or worsening -Repeat CMP in a.m.  Persistent Sinus Tachycardia -No fever or rigidity to suggest neuroleptic malignant syndrome. -Assess for possible underlying infectious etiology: Afebrile and no leukocytosis.   -Chest x-ray not suggestive of pneumonia and showed "Low inspiratory volumes with bibasilar atelectasis. No significant interval change compared to earlier this morning."  -No nuchal rigidity/meningeal signs to suggest meningitis.   -UA, urine culture not done so will reorder.   -Blood culture x2 showed NGTD at 2 Days. -TSH was 1.823 -C/w Cardiac monitoring and Telemetry.  EKG in a.m. -Echocardiogram showed Normal EF and is below  -? Withdrawal from unknown substance   -IV Lopressor PRN HR >130 -Unclear if she has a history of alcohol abuse.  Will keep on CIWA protocol/Ativan PRN.  -UDS was Positive of Benzodiazepines  -patient was having acute urinary retention as may have been contributing to her tachycardia so we will place Foley  catheter and monitor -Cardiology was consulted and Dr. Gaynelle ArabianShuman feels that her persistent rates within the 100s 120s are is likely secondary to rhabdo and agitation psych aortic issues.  There is no structural heart disease on TTE and likely the treatment will be addressing underlying cause and no further cardiac work-up is recommended at this time -Dr. Gaynelle ArabianShuman recommends readdressing if tachycardic once medical and psych issues are resolved  Suicidal Ideation, Agitation/Psychosis -Psychiatry was following and recommended stopping Depakote and continuing Ativan for now  -Plan to admit to inpatient psych [facility] -Suicide precautions, sitter at bedside -Check U/A and UCx to evaluate for any infectious cause of her Psychosis and also check UDS -Will Re-consult Psych given her aggressive Behavior and Psychosis -Continue to Monitor   -Continue one-to-one sitter  Renal insufficiency/AKI, worsening Acute Urinary Retention  -In the setting of rhabdomyolysis -Patient's BUN/creatinine went from 10/0.78 -> 7/1.25 -> 9/1.49 -Continue to monitor and trend renal function -Continue with IV fluid hydration as above -Check Urine Osm, Urine Cr, Urine Sodium -Check U/A and Urine Cx as Well  -His Foley catheter and she merely drained 2 L -Check renal ultrasound normal kidneys -Avoid nephrotoxic medications, contrast dyes, as well as hypotension -Repeat CMP in the a.m.  Hyperphosphatemia -Patient's phosphorus level this morning was 4.9 -Likely in the setting of acute kidney  injury -Continue monitor and trend -Repeat phosphorus level in a.m.  Normocytic Anemia -Likely dilutional drop in the setting of IV fluid resuscitation -Hemoglobin/hematocrit is now 10.4/34.5 -Checked anemia panel and patient's iron level was 25, U IBC was 304, TIBC was 329, saturation ratios were 8%, ferritin was 66, folate level was 17.9, and vitamin B12 level was 1033 -Continue to monitor for signs and  symptoms of bleeding; currently having her menses also bit her tongue this morning -Repeat CBC in a.m.  Obesity -Estimated body mass index is 36.31 kg/m as calculated from the following:   Height as of this encounter: 5\' 3"  (1.6 m).   Weight as of this encounter: 93 kg. -She is more awake we will provide weight loss counseling and dietary education  DVT prophylaxis: Enoxaparin 40 mg sq Daily  Code Status: FULL CODE  Family Communication: Attempted to call the mother on her cell phone x2 and she did not pick up there is no family at bedside when I went Disposition Plan: Inpatient psychiatric hospitalization once she is medically stable and improved  Consultants:   Cardiology  Psychiatry   Procedures:  ECHOCARDIOGRAM 07/06/2019 IMPRESSIONS    1. The left ventricle has normal systolic function with an ejection fraction of 60-65%. The cavity size was normal. Left ventricular diastolic parameters were normal.  2. The right ventricle has normal systolic function. The cavity was normal. There is no increase in right ventricular wall thickness.  3. The mitral valve is grossly normal.  4. The tricuspid valve is grossly normal.  5. The aortic valve is tricuspid.  6. The aorta is normal unless otherwise noted.  FINDINGS  Left Ventricle: The left ventricle has normal systolic function, with an ejection fraction of 60-65%. The cavity size was normal. There is borderline increase in left ventricular wall thickness. Left ventricular diastolic parameters were normal.  Right Ventricle: The right ventricle has normal systolic function. The cavity was normal. There is no increase in right ventricular wall thickness.  Left Atrium: Left atrial size was normal in size.  Right Atrium: Right atrial size was normal in size. Right atrial pressure is estimated at 8 mmHg.  Interatrial Septum: No atrial level shunt detected by color flow Doppler.  Pericardium: There is no evidence of  pericardial effusion.  Mitral Valve: The mitral valve is grossly normal. Mitral valve regurgitation is not visualized by color flow Doppler.  Tricuspid Valve: The tricuspid valve is grossly normal. Tricuspid valve regurgitation was not visualized by color flow Doppler.  Aortic Valve: The aortic valve is tricuspid Aortic valve regurgitation was not visualized by color flow Doppler. There is no evidence of aortic valve stenosis.  Pulmonic Valve: The pulmonic valve was grossly normal. Pulmonic valve regurgitation is not visualized by color flow Doppler.  Aorta: The aorta is normal unless otherwise noted.  Venous: The inferior vena cava is normal in size with greater than 50% respiratory variability.    +--------------+--------++  LEFT VENTRICLE            +--------------+--------++  PLAX 2D                   +--------------+--------++  LVIDd:         4.10 cm    +--------------+--------++  LVIDs:         2.70 cm    +--------------+--------++  LV PW:         1.00 cm    +--------------+--------++  LV IVS:        1.00  cm    +--------------+--------++  LVOT diam:     1.90 cm    +--------------+--------++  LV SV:         47 ml      +--------------+--------++  LV SV Index:   24.13      +--------------+--------++  LVOT Area:     2.84 cm   +--------------+--------++                            +--------------+--------++  +---------------+----------++  RIGHT VENTRICLE              +---------------+----------++  RV S prime:     21.80 cm/s   +---------------+----------++  TAPSE (M-mode): 2.7 cm       +---------------+----------++  +---------------+-------++-----------++  LEFT ATRIUM              Index         +---------------+-------++-----------++  LA diam:        2.90 cm  1.56 cm/m    +---------------+-------++-----------++  LA Vol (A2C):   26.1 ml  14.00 ml/m   +---------------+-------++-----------++  LA Vol (A4C):   21.3 ml  11.43  ml/m   +---------------+-------++-----------++  LA Biplane Vol: 24.0 ml  12.87 ml/m   +---------------+-------++-----------++ +------------+--------++----------++  RIGHT ATRIUM           Index        +------------+--------++----------++  RA Area:     9.45 cm               +------------+--------++----------++  RA Volume:   16.00 ml  8.58 ml/m   +------------+--------++----------++  +------------+-----------++  AORTIC VALVE               +------------+-----------++  LVOT Vmax:   118.00 cm/s   +------------+-----------++  LVOT Vmean:  72.400 cm/s   +------------+-----------++  LVOT VTI:    0.179 m       +------------+-----------++   +-------------+-------++  AORTA                   +-------------+-------++  Ao Root diam: 2.40 cm   +-------------+-------++    +--------------+-------+  SHUNTS                  +--------------+-------+  Systemic VTI:  0.18 m   +--------------+-------+  Systemic Diam: 1.90 cm  +--------------+-------+  EEG Technical aspects: This EEG study was done with scalp electrodes positioned according to the 10-20 International system of electrode placement. Electrical activity was acquired at a sampling rate of 500Hz  and reviewed with a high frequency filter of 70Hz  and a low frequency filter of 1Hz . EEG data were recorded continuously and digitally stored.   DESCRIPTION: The posterior dominant rhythm consists of 9-10 Hz activity of moderate voltage (25-35 uV) seen predominantly in posterior head regions, symmetric and reactive to eye opening and eye closing. There is an excessive amount of 15 to 18 Hz, 2-3 uV beta activity with irregular morphology distributed symmetrically and diffusely. Hyperventilation and photic stimulation were not performed.  IMPRESSION: This study is within normal limits. No seizures or epileptiform discharges were seen throughout the recording.   The excessive beta activity seen in the background is most likely due to the  effect of benzodiazepine and is a benign EEG pattern.  Antimicrobials:  Anti-infectives (From admission, onward)   None     Subjective: Seen at bedside and and she had gotten Ativan early this morning because of agitation and  she was resting and not responding to me.  She is sleeping on examination and bladder scan showed 9 9 7  mL so she just had a Foley catheter placed.  Not agitated around it was here.  Unable to get a subjective history no family present at bedside.  Objective: Vitals:   07/09/19 0431 07/09/19 0606 07/09/19 0624 07/09/19 0718  BP: (!) 148/96 (!) 146/109  (!) 145/90  Pulse: (!) 119 (!) 111  (!) 126  Resp: 16 14    Temp:      TempSrc:   Oral   SpO2:   100%   Weight:   93 kg   Height:        Intake/Output Summary (Last 24 hours) at 07/09/2019 1109 Last data filed at 07/09/2019 1059 Gross per 24 hour  Intake 1613.54 ml  Output 1050 ml  Net 563.54 ml   Filed Weights   07/07/19 0337 07/08/19 0706 07/09/19 0624  Weight: 83.2 kg 90.7 kg 93 kg   Examination: Physical Exam:  Constitutional: Well-nourished, well-developed obese African-American female currently resting and was agitated this morning and unable to provide a subjective history Eyes: Lids are normal.  Has her eyes closed throughout the entire examination ENMT: External ears and nose appear normal.  She has blood in her oropharynx from biting her tongue and blood around her mouth Neck: Appears supple no JVD Respiratory: Diminished auscultation bilaterally no appreciable wheezing, rales, rhonchi.  Patient not tachypneic wheezing excess muscle breathe Cardiovascular: Tachycardic rate but regular rate and rhythm.  No appreciable murmurs, rubs, gallops.  No lower extremity edema noted Abdomen: Soft, nontender, distended second body habitus.  Backslash present GU: Deferred but Foley catheter is now in place and she has some blood on her leg from her menses Musculoskeletal: No contractures or cyanosis.   Wearing soft mitten restraints Skin: Skin is warm and dry no appreciable rashes or lesions on to skin evaluation. Neurologic: Does not follow commands and did not open her eyes.  Somnolent and drowsy Psychiatric: Impaired judgment and insight.  She is somnolent and drowsy today  Data Reviewed: I have personally reviewed following labs and imaging studies  CBC: Recent Labs  Lab 07/03/19 2226  07/05/19 1010 07/06/19 0455 07/07/19 0510 07/08/19 1008 07/09/19 0438  WBC 10.1   < > 10.9* 8.6 8.3 6.8 6.8  NEUTROABS 6.2  --  7.4  --   --  4.1 4.3  HGB 13.1   < > 11.4* 10.7* 10.8* 10.6* 10.4*  HCT 41.9   < > 36.7 34.5* 34.4* 34.1* 34.5*  MCV 82.8   < > 84.8 85.0 83.3 84.2 85.4  PLT 398   < > 361 329 331 331 364   < > = values in this interval not displayed.   Basic Metabolic Panel: Recent Labs  Lab 07/06/19 0455 07/07/19 0510 07/07/19 2040 07/08/19 1008 07/09/19 0438  NA 145 142 147* 147* 146*  K 3.4* 2.9* 3.2* 3.1* 3.2*  CL 113* 109 114* 115* 113*  CO2 18* 22 20* 22 22  GLUCOSE 71 101* 76 98 119*  BUN 6 <5* 6 7 9   CREATININE 1.09* 1.04* 1.16* 1.25* 1.49*  CALCIUM 8.9 9.2 9.3 8.9 9.0  MG  --   --   --  1.8 2.0  PHOS 5.1*  --   --  4.4 4.8*   GFR: Estimated Creatinine Clearance: 62 mL/min (A) (by C-G formula based on SCr of 1.49 mg/dL (H)). Liver Function Tests: Recent Labs  Lab 07/04/19  2204 07/05/19 1722 07/06/19 0455 07/08/19 1008 07/09/19 0438  AST 79* 70* 57* 41 37  ALT 121* 90* 79* 61* 57*  ALKPHOS 57 50 42 52 52  BILITOT 1.3* 1.2 0.9 1.0 0.7  PROT 8.3* 7.0 6.9 7.8 7.4  ALBUMIN 4.1 3.4* 3.4* 3.5 3.6   No results for input(s): LIPASE, AMYLASE in the last 168 hours. No results for input(s): AMMONIA in the last 168 hours. Coagulation Profile: No results for input(s): INR, PROTIME in the last 168 hours. Cardiac Enzymes: Recent Labs  Lab 07/06/19 0455 07/06/19 0724 07/07/19 0510 07/08/19 1008 07/09/19 0837  CKTOTAL 2,968* 2,729* 2,221* 1,270* 947*  CKMB   --   --   --  0.9  --    BNP (last 3 results) No results for input(s): PROBNP in the last 8760 hours. HbA1C: No results for input(s): HGBA1C in the last 72 hours. CBG: Recent Labs  Lab 07/05/19 0217 07/05/19 0614 07/08/19 2258  GLUCAP 91 76 96   Lipid Profile: No results for input(s): CHOL, HDL, LDLCALC, TRIG, CHOLHDL, LDLDIRECT in the last 72 hours. Thyroid Function Tests: No results for input(s): TSH, T4TOTAL, FREET4, T3FREE, THYROIDAB in the last 72 hours. Anemia Panel: Recent Labs    07/09/19 0438  VITAMINB12 1,033*  FOLATE 17.9  FERRITIN 66  TIBC 329  IRON 25*  RETICCTPCT 1.2   Sepsis Labs: No results for input(s): PROCALCITON, LATICACIDVEN in the last 168 hours.  Recent Results (from the past 240 hour(s))  SARS Coronavirus 2 [facility] order, Performed in [place] hospital lab) Nasopharyngeal Nasopharyngeal Swab     Status: None   Collection Time: 07/01/19  9:46 PM   Specimen: Nasopharyngeal Swab  Result Value Ref Range Status   SARS Coronavirus 2 NEGATIVE NEGATIVE Final    Comment: (NOTE) If result is NEGATIVE SARS-CoV-2 target nucleic acids are NOT DETECTED. The SARS-CoV-2 RNA is generally detectable in upper and lower  respiratory specimens during the acute phase of infection. The lowest  concentration of SARS-CoV-2 viral copies this assay can detect is 250  copies / mL. A negative result does not preclude SARS-CoV-2 infection  and should not be used as the sole basis for treatment or other  patient management decisions.  A negative result may occur with  improper specimen collection / handling, submission of specimen other  than nasopharyngeal swab, presence of viral mutation(s) within the  areas targeted by this assay, and inadequate number of viral copies  (<250 copies / mL). A negative result must be combined with clinical  observations, patient history, and epidemiological information. If result is POSITIVE SARS-CoV-2 target nucleic acids are  DETECTED. The SARS-CoV-2 RNA is generally detectable in upper and lower  respiratory specimens dur ing the acute phase of infection.  Positive  results are indicative of active infection with SARS-CoV-2.  Clinical  correlation with patient history and other diagnostic information is  necessary to determine patient infection status.  Positive results do  not rule out bacterial infection or co-infection with other viruses. If result is PRESUMPTIVE POSTIVE SARS-CoV-2 nucleic acids MAY BE PRESENT.   A presumptive positive result was obtained on the submitted specimen  and confirmed on repeat testing.  While 2019 novel coronavirus  (SARS-CoV-2) nucleic acids may be present in the submitted sample  additional confirmatory testing may be necessary for epidemiological  and / or clinical management purposes  to differentiate between  SARS-CoV-2 and other Sarbecovirus currently known to infect humans.  If clinically indicated additional testing with an  alternate test  methodology [phone]([id number]) is advised. The SARS-CoV-2 RNA is generally  detectable in upper and lower respiratory sp ecimens during the acute  phase of infection. The expected result is Negative. Fact Sheet for Patients:  BoilerBrush.com.cyhttps://www.fda.gov/media/136312/download Fact Sheet for Healthcare Providers: https://pope.com/https://www.fda.gov/media/136313/download This test is not yet approved or cleared by the [place] FDA and has been authorized for detection and/or diagnosis of SARS-CoV-2 by FDA under an Emergency Use Authorization (EUA).  This EUA will remain in effect (meaning this test can be used) for the duration of the COVID-19 declaration under Section 564(b)(1) of the Act, 21 U.S.C. section 360bbb-3(b)(1), unless the authorization is terminated or revoked sooner. Performed at [facility], [address] [place], [place] [address]   Culture, blood (routine x 2)     Status: None (Preliminary result)   Collection Time: 07/06/19 12:20  AM   Specimen: BLOOD  Result Value Ref Range Status   Specimen Description BLOOD RIGHT ARM  Final   Special Requests   Final    BOTTLES DRAWN AEROBIC AND ANAEROBIC Blood Culture adequate volume   Culture   Final    NO GROWTH 3 DAYS Performed at [facility], [address] [place], [place] [address]    Report Status PENDING  Incomplete  Culture, blood (routine x 2)     Status: None (Preliminary result)   Collection Time: 07/06/19 12:35 AM   Specimen: BLOOD  Result Value Ref Range Status   Specimen Description BLOOD RIGHT HAND  Final   Special Requests   Final    BOTTLES DRAWN AEROBIC AND ANAEROBIC Blood Culture results may not be optimal due to an inadequate volume of blood received in culture bottles   Culture   Final    NO GROWTH 3 DAYS Performed at [facility], [address] [place], [place] [address]    Report Status PENDING  Incomplete    Radiology Studies: [place] Renal  Result Date: 07/09/2019 CLINICAL DATA:  Acute renal injury EXAM: RENAL / URINARY TRACT ULTRASOUND COMPLETE COMPARISON:  None. FINDINGS: Right Kidney: Renal measurements: 12.6 x 5.5 x 6.3 cm = volume: 230 mL . Echogenicity within normal limits. No mass or hydronephrosis visualized. Left Kidney: Renal measurements: 13.1 x 5.8 x 5.5 cm = volume: 218 mL. Echogenicity within normal limits. No mass or hydronephrosis visualized. Bladder: Appears normal for degree of bladder distention. IMPRESSION: Normal kidneys. Electronically Signed   By: Genevive BiStewart  Edmunds M.D.   On: 07/09/2019 09:17    Scheduled Meds:  enoxaparin (LOVENOX) injection  40 mg Subcutaneous Daily   folic acid  1 mg Oral Daily   multivitamin with minerals  1 tablet Oral Daily   nicotine  21 mg Transdermal Q0600   thiamine  100 mg Oral Daily   Or   thiamine  100 mg Intravenous Daily   Continuous Infusions:  dextrose 5 % with KCl 20 mEq / L Stopped (07/08/19 1757)   methocarbamol (ROBAXIN) IV     potassium chloride 10 mEq  (07/09/19 0943)    LOS: 3 days    Merlene Laughtermair Latif Aslee Such, DO [facility] PAGER is on AMION  If 7PM-7AM, please contact night-coverage [email or web address] Password [email or web address] 07/09/2019, 11:09 AM

## 2019-07-09 NOTE — Progress Notes (Addendum)
Patient agitated and combative. MEWS= 4. Kicking staff and jumped OOB  Attempting to attack staff RN and ripped out Peripheral IV from right arm. No verbal response but growled at times. Security called and patient assisted to bed. RRT nurse arrived and attempted to place new IV without success. Ativan 1 mg given IM. MD notified of episode of agitation, MEWS=4 and elevated BP. IV team consulted.

## 2019-07-09 NOTE — Progress Notes (Signed)
Patient has had urinary retention with bladder scans with volumes today 997,[phone],and 423. Md in see patient reported to place f/c due to possible bladder obstruction has pending [place] of kidneys pending. Patient also has been pulling at her lines f/c and had dislodged her PIV several times despite being wrapped so hand mit restraints have been applied. She continues with bedside sitter but patient is strong and fast can become very violent with staff kicking,hitting,spitting etc. She is disrobing in her sleep pulling at things and has bit her tongue in her sleep as well MD is aware. She refused to take morning meds she spit them out earlier with previous shift.  No further changes noted.

## 2019-07-09 NOTE — Consult Note (Addendum)
Telepsych Consultation   Reason for Consult:  "Aggressive Behavior, Psychosis" Referring Physician:  Dr. Fran LowesAva Swayze  Location of Patient: [facility] Location of Provider: [facility]  Patient Identification: Veronica BaileyKyla Arias MRN:  [id number] Principal Diagnosis: Altered mental status Diagnosis:  Principal Problem:   Rhabdomyolysis Active Problems:   Sinus tachycardia   Suicidal ideation   Psychosis (HCC)   Total Time spent with patient: 1 hour  Subjective:   Veronica BaileyKyla Arias is a 28 y.o. female patient admitted with rhabdomyolysis.  HPI:   Per chart review, patient initially presented to the hospital on 8/8 for anxiety related to psychosocial stressors including school resuming. She is a Psychologist, forensichigh school teacher. She was psychiatrically cleared and provided with outpatient mental health resources. Patient presented again to the hospital on 8/10 for bizarre behavior, SI and paranoia. She was reportedly throwing objects from her balcony and endorsed SI with a plan to jump. She has been agitated and required multiple emergency behavioral medications. She was placed in restraints while in the ED. She developed rhabdomyolysis with persistent tachycardia and was admitted to the hospitalist service while awaiting psychiatric placement. She has received IVFs for AKI and rhabdomyolysis. Depakote and Zyprexa were discontinued on 8/14 due to concern for abnormal LFTs and rhabdomyolysis. On 8/18, she spiked a fever to 100.8 and tachycardia up to 140s. She was started on Unasyn given concern for fever. Patient was receiving IV Ativan 1 mg TID for concern for seizures (8/20-8/24). It was discontinued at her mother's request. Her workup has been unremarkable for infection or seizures. Autoimmune workup is pending. 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 IV Ativan did not appear to improve her symptoms. There was a brief discussion with the family  regarding ECT although it is unclear if they are open to considering treatment. Patient is followed by SLP. She continues to remain NPO. Primary team recommend a feeding tube but patient's mother refuses.  Veronica Arias is unable to participate in interview but does open her eyes to verbal stimuli. She moves her face muscles in an attempt to speak but is unsuccessful. Her mother is at bedside and provides her history. She denies a prior psychiatric history. She denies known alcohol or illicit substance use. She reports that she recognizes her mother and has intermittently mumbled "mama." She is informed about the concern for catatonia. She reports that she does not want Ativan to be resumed since it made her drowsy. ECT is discussed with her. She reports that she does not want any further treatment and would like to have her [facility] for medical and mental health care. She reports that she is willing to consider referral for ECT treatment if the current plan is unsuccessful.   Past Psychiatric History: Denies   Risk to Self: Endorsed SI on admission.  Risk to Others: Previously aggressive to staff.  Prior Inpatient Therapy: Prior Inpatient Therapy: No Prior Outpatient Therapy: Prior Outpatient Therapy: No Does patient have an ACCT team?: No Does patient have Intensive In-House Services?  : No Does patient have [facility] services? : No Does patient have P4CC services?: No  Past Medical History:  Past Medical History:  Diagnosis Date  . Asthma    No past surgical history on file. Family History: No family history on file. Family Psychiatric  History: Mother denies.  Social History:  Social History   Substance and Sexual Activity  Alcohol Use  No     Social History   Substance and Sexual Activity  Drug Use No    Social History   Socioeconomic History  . Marital status: Single    Spouse name: Not on file  . Number of  children: Not on file  . Years of education: Not on file  . Highest education level: Not on file  Occupational History  . Not on file  Social Needs  . Financial resource strain: Not on file  . Food insecurity    Worry: Not on file    Inability: Not on file  . Transportation needs    Medical: Not on file    Non-medical: Not on file  Tobacco Use  . Smoking status: Never Smoker  . Smokeless tobacco: Never Used  Substance and Sexual Activity  . Alcohol use: No  . Drug use: No  . Sexual activity: Not on file  Lifestyle  . Physical activity    Days per week: Not on file    Minutes per session: Not on file  . Stress: Not on file  Relationships  . Social Musicianconnections    Talks on phone: Not on file    Gets together: Not on file    Attends religious service: Not on file    Active member of club or organization: Not on file    Attends meetings of clubs or organizations: Not on file    Relationship status: Not on file  Other Topics Concern  . Not on file  Social History Narrative  . Not on file   Additional Social History: She is a Runner, broadcasting/film/videoteacher at [employer]. She does not use alcohol or illicit substances.     Allergies:  No Known Allergies  Labs:  Results for orders placed or performed during the hospital encounter of 07/01/19 (from the past 48 hour(s))  Basic metabolic panel     Status: Abnormal   Collection Time: 07/07/19  8:40 PM  Result Value Ref Range   Sodium 147 (H) 135 - 145 mmol/L   Potassium 3.2 (L) 3.5 - 5.1 mmol/L   Chloride 114 (H) 98 - 111 mmol/L   CO2 20 (L) 22 - 32 mmol/L   Glucose, Bld 76 70 - 99 mg/dL   BUN 6 6 - 20 mg/dL   Creatinine, Ser 1.611.16 (H) 0.44 - 1.00 mg/dL   Calcium 9.3 8.9 - 09.610.3 mg/dL   GFR calc non Af Amer >60 >60 mL/min   GFR calc Af Amer >60 >60 mL/min   Anion gap 13 5 - 15    Comment: Performed at [facility], [address] [place], [place] [address]  CK total and CKMB (cardiac)not at [facility]     Status: Abnormal   Collection  Time: 07/08/19 10:08 AM  Result Value Ref Range   Total CK 1,270 (H) 38 - 234 U/L   CK, MB 0.9 0.5 - 5.0 ng/mL   Relative Index 0.1 0.0 - 2.5    Comment: Performed at [facility], [address] [place], [place] [address]  CBC with Differential/Platelet     Status: Abnormal   Collection Time: 07/08/19 10:08 AM  Result Value Ref Range   WBC 6.8 4.0 - 10.5 K/uL   RBC 4.05 3.87 - 5.11 MIL/uL   Hemoglobin 10.6 (L) 12.0 - 15.0 g/dL   HCT 14.734.1 (L) 82.936.0 - 56.246.0 %   MCV 84.2 80.0 - 100.0 fL   MCH 26.2 26.0 - 34.0 pg  MCHC 31.1 30.0 - 36.0 g/dL   RDW 13.2 11.5 - 15.5 %   Platelets 331 150 - 400 K/uL   nRBC 0.0 0.0 - 0.2 %   Neutrophils Relative % 61 %   Neutro Abs 4.1 1.7 - 7.7 K/uL   Lymphocytes Relative 25 %   Lymphs Abs 1.7 0.7 - 4.0 K/uL   Monocytes Relative 11 %   Monocytes Absolute 0.8 0.1 - 1.0 K/uL   Eosinophils Relative 2 %   Eosinophils Absolute 0.2 0.0 - 0.5 K/uL   Basophils Relative 1 %   Basophils Absolute 0.1 0.0 - 0.1 K/uL   Immature Granulocytes 0 %   Abs Immature Granulocytes 0.02 0.00 - 0.07 K/uL    Comment: Performed at [facility] [address], [place], [place] [address]  Comprehensive metabolic panel     Status: Abnormal   Collection Time: 07/08/19 10:08 AM  Result Value Ref Range   Sodium 147 (H) 135 - 145 mmol/L   Potassium 3.1 (L) 3.5 - 5.1 mmol/L   Chloride 115 (H) 98 - 111 mmol/L   CO2 22 22 - 32 mmol/L   Glucose, Bld 98 70 - 99 mg/dL   BUN 7 6 - 20 mg/dL   Creatinine, Ser 1.25 (H) 0.44 - 1.00 mg/dL   Calcium 8.9 8.9 - 10.3 mg/dL   Total Protein 7.8 6.5 - 8.1 g/dL   Albumin 3.5 3.5 - 5.0 g/dL   AST 41 15 - 41 U/L   ALT 61 (H) 0 - 44 U/L   Alkaline Phosphatase 52 38 - 126 U/L   Total Bilirubin 1.0 0.3 - 1.2 mg/dL   GFR calc non Af Amer 59 (L) >60 mL/min   GFR calc Af Amer >60 >60 mL/min   Anion gap 10 5 - 15    Comment: Performed at [facility], [place] [address], [place], [place] [address]  Magnesium     Status: None    Collection Time: 07/08/19 10:08 AM  Result Value Ref Range   Magnesium 1.8 1.7 - 2.4 mg/dL    Comment: Performed at [facility] [address] [place], [place] [address]  Phosphorus     Status: None   Collection Time: 07/08/19 10:08 AM  Result Value Ref Range   Phosphorus 4.4 2.5 - 4.6 mg/dL    Comment: Performed at [facility] [address] [place], [place] [address]  Glucose, capillary     Status: None   Collection Time: 07/08/19 10:58 PM  Result Value Ref Range   Glucose-Capillary 96 70 - 99 mg/dL  Vitamin B12     Status: Abnormal   Collection Time: 07/09/19  4:38 AM  Result Value Ref Range   Vitamin B-12 1,033 (H) 180 - 914 pg/mL    Comment: (NOTE) This assay is not validated for testing neonatal or myeloproliferative syndrome specimens for Vitamin B12 levels. Performed at [facility], [place] [address] [place], [place] [address]   Folate     Status: None   Collection Time: 07/09/19  4:38 AM  Result Value Ref Range   Folate 17.9 >5.9 ng/mL    Comment: Performed at [facility] [address], [place], [place] [address]  Iron and TIBC     Status: Abnormal   Collection Time: 07/09/19  4:38 AM  Result Value Ref Range   Iron 25 (L) 28 - 170 ug/dL   TIBC 329 250 - 450 ug/dL  Saturation Ratios 8 (L) 10.4 - 31.8 %   UIBC 304 ug/dL    Comment: Performed at [facility], [address], [place], [place] [address]  Ferritin     Status: None   Collection Time: 07/09/19  4:38 AM  Result Value Ref Range   Ferritin 66 11 - 307 ng/mL    Comment: Performed at [facility], [address], [place], [place] [address]  Reticulocytes     Status: None   Collection Time: 07/09/19  4:38 AM  Result Value Ref Range   Retic Ct Pct 1.2 0.4 - 3.1 %   RBC. 4.04 3.87 - 5.11 MIL/uL   Retic Count, Absolute 46.9 19.0 - 186.0 K/uL   Immature Retic Fract 6.3 2.3 - 15.9 %    Comment: Performed at [facility], [address] [place], [place]  [address]  Magnesium     Status: None   Collection Time: 07/09/19  4:38 AM  Result Value Ref Range   Magnesium 2.0 1.7 - 2.4 mg/dL    Comment: Performed at [facility], [address], [place], [place] [address]  Phosphorus     Status: Abnormal   Collection Time: 07/09/19  4:38 AM  Result Value Ref Range   Phosphorus 4.8 (H) 2.5 - 4.6 mg/dL    Comment: Performed at [facility], [address], [place], [place] [address]  CBC with Differential/Platelet     Status: Abnormal   Collection Time: 07/09/19  4:38 AM  Result Value Ref Range   WBC 6.8 4.0 - 10.5 K/uL   RBC 4.04 3.87 - 5.11 MIL/uL   Hemoglobin 10.4 (L) 12.0 - 15.0 g/dL   HCT 29.5 (L) 28.4 - 13.2 %   MCV 85.4 80.0 - 100.0 fL   MCH 25.7 (L) 26.0 - 34.0 pg   MCHC 30.1 30.0 - 36.0 g/dL   RDW 44.0 10.2 - 72.5 %   Platelets 364 150 - 400 K/uL   nRBC 0.0 0.0 - 0.2 %   Neutrophils Relative % 64 %   Neutro Abs 4.3 1.7 - 7.7 K/uL   Lymphocytes Relative 23 %   Lymphs Abs 1.5 0.7 - 4.0 K/uL   Monocytes Relative 10 %   Monocytes Absolute 0.7 0.1 - 1.0 K/uL   Eosinophils Relative 2 %   Eosinophils Absolute 0.1 0.0 - 0.5 K/uL   Basophils Relative 1 %   Basophils Absolute 0.1 0.0 - 0.1 K/uL   Immature Granulocytes 0 %   Abs Immature Granulocytes 0.02 0.00 - 0.07 K/uL    Comment: Performed at [facility], [address] [place], [place] [address]  Comprehensive metabolic panel     Status: Abnormal   Collection Time: 07/09/19  4:38 AM  Result Value Ref Range   Sodium 146 (H) 135 - 145 mmol/L   Potassium 3.2 (L) 3.5 - 5.1 mmol/L   Chloride 113 (H) 98 - 111 mmol/L   CO2 22 22 - 32 mmol/L   Glucose, Bld 119 (H) 70 - 99 mg/dL   BUN 9 6 - 20 mg/dL   Creatinine, Ser 0.34 (H) 0.44 - 1.00 mg/dL   Calcium 9.0 8.9 - 74.2 mg/dL   Total Protein 7.4 6.5 - 8.1 g/dL   Albumin 3.6 3.5 - 5.0 g/dL   AST 37 15 - 41 U/L   ALT 57 (H) 0 - 44 U/L   Alkaline Phosphatase 52 38 - 126 U/L  Total Bilirubin 0.7 0.3 - 1.2 mg/dL   GFR  calc non Af Amer 48 (L) >60 mL/min   GFR calc Af Amer 56 (L) >60 mL/min   Anion gap 11 5 - 15    Comment: Performed at [facility], [address] [place], [place] [address]  Osmolality, urine     Status: Abnormal   Collection Time: 07/09/19  8:17 AM  Result Value Ref Range   Osmolality, Ur 205 (L) 300 - 900 mOsm/kg    Comment: Performed at [facility], [address] [place], [place] [address]  Creatinine, urine, random     Status: None   Collection Time: 07/09/19  8:17 AM  Result Value Ref Range   Creatinine, Urine 89.18 mg/dL    Comment: Performed at [facility], [address], [place], [place] [address]  Sodium, urine, random     Status: None   Collection Time: 07/09/19  8:17 AM  Result Value Ref Range   Sodium, Ur 74 mmol/L    Comment: Performed at [facility], [address], [place], [place] [address]  CK     Status: Abnormal   Collection Time: 07/09/19  8:37 AM  Result Value Ref Range   Total CK 947 (H) 38 - 234 U/L    Comment: Performed at [facility], [address], [place], [place] [address]  Urine rapid drug screen (hosp performed)     Status: Abnormal   Collection Time: 07/09/19 11:00 AM  Result Value Ref Range   Opiates NONE DETECTED NONE DETECTED   Cocaine NONE DETECTED NONE DETECTED   Benzodiazepines POSITIVE (A) NONE DETECTED   Amphetamines NONE DETECTED NONE DETECTED   Tetrahydrocannabinol NONE DETECTED NONE DETECTED   Barbiturates NONE DETECTED NONE DETECTED    Comment: (NOTE) DRUG SCREEN FOR MEDICAL PURPOSES ONLY.  IF CONFIRMATION IS NEEDED FOR ANY PURPOSE, NOTIFY LAB WITHIN 5 DAYS. LOWEST DETECTABLE LIMITS FOR URINE DRUG SCREEN Drug Class                     Cutoff (ng/mL) Amphetamine and metabolites    1000 Barbiturate and metabolites    200 Benzodiazepine                 200 Tricyclics and metabolites     300 Opiates and metabolites        300 Cocaine and metabolites        300 THC                             50 Performed at [facility], [address], [place], [place] [address]   Urinalysis, Routine w reflex microscopic     Status: Abnormal   Collection Time: 07/09/19 11:15 AM  Result Value Ref Range   Color, Urine YELLOW YELLOW   APPearance CLEAR CLEAR   Specific Gravity, Urine 1.006 1.005 - 1.030   pH 5.0 5.0 - 8.0   Glucose, UA NEGATIVE NEGATIVE mg/dL   Hgb urine dipstick MODERATE (A) NEGATIVE   Bilirubin Urine NEGATIVE NEGATIVE   Ketones, ur NEGATIVE NEGATIVE mg/dL   Protein, ur NEGATIVE NEGATIVE mg/dL   Nitrite NEGATIVE NEGATIVE   Leukocytes,Ua NEGATIVE NEGATIVE   RBC / HPF 0-5 0 - 5 RBC/hpf   WBC, UA 0-5 0 - 5 WBC/hpf   Bacteria, UA NONE SEEN NONE SEEN   Squamous Epithelial / LPF  0-5 0 - 5   Mucus PRESENT     Comment: Performed at [facility], [address], [place], [place] [address]    Medications:  Current Facility-Administered Medications  Medication Dose Route Frequency Provider Last Rate Last Dose  . acetaminophen (TYLENOL) tablet 650 mg  650 mg Oral Q6H PRN John Giovanni, MD   650 mg at 07/06/19 2306  . alum & mag hydroxide-simeth (MAALOX/MYLANTA) 200-200-20 MG/5ML suspension 30 mL  30 mL Oral Q4H PRN John Giovanni, MD      . dextrose 5 % with KCl 20 mEq / L  infusion  20 mEq Intravenous Continuous Marguerita Merles Earth, DO   Stopped at 07/08/19 1757  . enoxaparin (LOVENOX) injection 40 mg  40 mg Subcutaneous Daily John Giovanni, MD   40 mg at 07/09/19 0945  . folic acid (FOLVITE) tablet 1 mg  1 mg Oral Daily John Giovanni, MD      . hydrALAZINE (APRESOLINE) injection 5 mg  5 mg Intravenous Q6H PRN Marguerita Merles Hutchins, DO   5 mg at 07/09/19 0018  . LORazepam (ATIVAN) injection 1 mg  1 mg Intramuscular Q6H PRN Leatha Gilding, MD   1 mg at 07/09/19 6045  . magnesium hydroxide (MILK OF MAGNESIA) suspension 30 mL  30 mL Oral Daily PRN John Giovanni, MD      . methocarbamol (ROBAXIN) 500 mg in dextrose 5 % 50 mL IVPB  500 mg  Intravenous Q8H PRN Sheikh, Omair Latif, DO      . metoprolol tartrate (LOPRESSOR) injection 5 mg  5 mg Intravenous Q4H PRN John Giovanni, MD   5 mg at 07/09/19 0103  . multivitamin with minerals tablet 1 tablet  1 tablet Oral Daily John Giovanni, MD      . nicotine (NICODERM CQ - dosed in mg/24 hours) patch 21 mg  21 mg Transdermal Q0600 John Giovanni, MD   21 mg at 07/08/19 0547  . thiamine (VITAMIN B-1) tablet 100 mg  100 mg Oral Daily John Giovanni, MD       Or  . thiamine (B-1) injection 100 mg  100 mg Intravenous Daily John Giovanni, MD   100 mg at 07/09/19 0945    Musculoskeletal: Strength & Muscle Tone: No atrophy noted. Gait & Station: UTA since patient is lying in bed. Patient leans: N/A  Psychiatric Specialty Exam: Physical Exam  Nursing note and vitals reviewed. Constitutional: She appears well-developed and well-nourished.  HENT:  Head: Normocephalic and atraumatic.  Respiratory: Effort normal.  Neurological: She is alert.  Psychiatric: Her mood appears anxious. She is slowed and withdrawn. Cognition and memory are impaired. She expresses impulsivity. She is noncommunicative.  UTA since patient is nonverbal.     Review of Systems  Unable to perform ROS: Patient nonverbal    Blood pressure (!) 194/112, pulse (!) 133, temperature 99.2 F (37.3 C), temperature source Oral, resp. rate 20, height 5\' 3"  (1.6 m), weight 93 kg, SpO2 99 %.Body mass index is 36.31 kg/m.  General Appearance: Fairly Groomed, young, African American female, wearing a hospital gown who is lying in bed. NAD.   Eye Contact:  Poor  Speech:  Patient is nonverbal.   Volume:  Patient is nonverbal.  Mood:  Patient is nonverbal.  Affect:  Flat  Thought Process:  NA  Orientation:  NA  Thought Content:  NA  Suicidal Thoughts:  Patient is nonverbal.  Homicidal Thoughts:  Patient is nonverbal.  Memory:  NA  Judgement:  NA  Insight:  NA  Psychomotor Activity:  NA   Concentration:  Concentration: NA and Attention Span: NA  Recall:  NA  Fund of Knowledge:  NA  Language:  NA  Akathisia:  NA  Handed:  Right  AIMS (if indicated):   N/A  Assets:  Financial Resources/Insurance Housing Social Support  ADL's:  Impaired  Cognition:  Impaired due to AMS.   Sleep:   N/A   Assessment:  Veronica Arias is a 28 y.o. female who was [facility] for bizarre behavior, SI and paranoia. She was initially agitated and required multiple emergency behavioral medications and restraints. She developed rhabdomyolysis with persistent tachycardia and was admitted to the hospitalist service while awaiting psychiatric placement. She developed a fever on 8/18. Her medical workup has been unremarkable to date. 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 [name] was informed that ECT is the standard of care. She declines referral for ECT and would like to transfer the [facility].   Treatment Plan Summary: -Would consider a retrial of IV Ativan for catatonic symptoms if mother is agreeable. Would increase Ativan to 2 mg TID.  -Alternatively would consider ECT for severe symptoms given refusal of PO intake.  -Consider involving ethics if there is not another viable treatment plan in place for adequate care of patient (such as patient not being able to [facility] to [facility]) since mother is currently making medical decisions for patient and has been refusing treatments that could substantially improve patient's prognosis.  -Will sign off on patient at this time. Please consult psychiatry again as needed.     Disposition: Discussed the above plan with patient's mother who is not agreeable at this time and would like to transfer the [facility].   This  service was provided via telemedicine using a 2-way, interactive audio and video technology.  Names of all persons participating in this telemedicine service and their role in this encounter. Name: Juanetta BeetsJacqueline Lukis Bunt, DO Role: Psychiatrist   Name: Eulis Cannerebra Dizdarevic  Role: [name]  Name: Veronica Arias  Role: Patient    Cherly BeachJacqueline J Gazella Anglin, DO 07/18/2019 3:24 PM

## 2019-07-09 NOTE — Progress Notes (Signed)
NCM will continue to monitor for TOC needs.  

## 2019-07-09 NOTE — Progress Notes (Signed)
   Vital Signs MEWS/VS Documentation      07/09/2019 1800 07/09/2019 1833 07/09/2019 1900 07/09/2019 2100   MEWS Score:  4  5  -  5   MEWS Score Color:  Red  Red  -  Red   Resp:  16  -  -  (!) 22   Pulse:  (!) 126  (!) 136  -  (!) 142   BP:  (!) 158/86  (!) 156/89  -  (!) 171/114   Temp:  -  -  -  99.8 F (37.7 C)   O2 Device:  Room Air  -  -  -           Tristan Schroeder 07/09/2019,10:10 PM

## 2019-07-09 NOTE — Progress Notes (Signed)
Updated provider on MEWS score. Will continue to monitor. No changes in pt this far into shift, but HR still 130s-140s. IV PRN medication given and will continue to monitor. HR/BP has been elevated since admission.

## 2019-07-09 NOTE — Progress Notes (Signed)
Patient has had MEWS of 5 since shift change, Medication given for HR, LOC reassessment done and patient is more alert. Will give PRN BP medication as well. Rapid response came by and notified her to keep patient on list.

## 2019-07-09 NOTE — Progress Notes (Signed)
Mom at bedside feeding patient she does not offer to feed herself prior to mitt application,patient spitting out food at the mom she is talking with her about inappropriateness. Patient has continued to drain more out of f/c it is more straw color dilute now appears to be empyting better. Urine cultures and urine tests sent for processing to lab as ordered. Sitter at bedside assisting the mom. No further changes noted.

## 2019-07-10 ENCOUNTER — Inpatient Hospital Stay (HOSPITAL_COMMUNITY): Payer: BC Managed Care – PPO

## 2019-07-10 ENCOUNTER — Encounter (HOSPITAL_COMMUNITY): Payer: Self-pay | Admitting: General Practice

## 2019-07-10 DIAGNOSIS — R509 Fever, unspecified: Secondary | ICD-10-CM

## 2019-07-10 DIAGNOSIS — F29 Unspecified psychosis not due to a substance or known physiological condition: Secondary | ICD-10-CM

## 2019-07-10 LAB — COMPREHENSIVE METABOLIC PANEL
ALT: 65 U/L — ABNORMAL HIGH (ref 0–44)
AST: 44 U/L — ABNORMAL HIGH (ref 15–41)
Albumin: 3.6 g/dL (ref 3.5–5.0)
Alkaline Phosphatase: 55 U/L (ref 38–126)
Anion gap: 13 (ref 5–15)
BUN: 5 mg/dL — ABNORMAL LOW (ref 6–20)
CO2: 26 mmol/L (ref 22–32)
Calcium: 8.9 mg/dL (ref 8.9–10.3)
Chloride: 106 mmol/L (ref 98–111)
Creatinine, Ser: 1.27 mg/dL — ABNORMAL HIGH (ref 0.44–1.00)
GFR calc Af Amer: >60 mL/min (ref >60)
GFR calc non Af Amer: 58 mL/min — ABNORMAL LOW (ref >60)
Glucose, Bld: 121 mg/dL — ABNORMAL HIGH (ref 70–99)
Potassium: 3.2 mmol/L — ABNORMAL LOW (ref 3.5–5.1)
Sodium: 145 mmol/L (ref 135–145)
Total Bilirubin: 0.7 mg/dL (ref 0.3–1.2)
Total Protein: 7.6 g/dL (ref 6.5–8.1)

## 2019-07-10 LAB — CBC WITH DIFFERENTIAL/PLATELET
Abs Immature Granulocytes: 0.03 10*3/uL (ref 0.00–0.07)
Basophils Absolute: 0.1 10*3/uL (ref 0.0–0.1)
Basophils Relative: 1 %
Eosinophils Absolute: 0.1 10*3/uL (ref 0.0–0.5)
Eosinophils Relative: 1 %
HCT: 36.3 % (ref 36.0–46.0)
Hemoglobin: 11.4 g/dL — ABNORMAL LOW (ref 12.0–15.0)
Immature Granulocytes: 0 %
Lymphocytes Relative: 20 %
Lymphs Abs: 1.7 10*3/uL (ref 0.7–4.0)
MCH: 26.2 pg (ref 26.0–34.0)
MCHC: 31.4 g/dL (ref 30.0–36.0)
MCV: 83.4 fL (ref 80.0–100.0)
Monocytes Absolute: 0.9 10*3/uL (ref 0.1–1.0)
Monocytes Relative: 10 %
Neutro Abs: 5.9 10*3/uL (ref 1.7–7.7)
Neutrophils Relative %: 68 %
Platelets: 362 10*3/uL (ref 150–400)
RBC: 4.35 MIL/uL (ref 3.87–5.11)
RDW: 13.4 % (ref 11.5–15.5)
WBC: 8.7 10*3/uL (ref 4.0–10.5)
nRBC: 0 % (ref 0.0–0.2)

## 2019-07-10 LAB — URINE CULTURE: Culture: NO GROWTH

## 2019-07-10 LAB — MAGNESIUM: Magnesium: 1.8 mg/dL (ref 1.7–2.4)

## 2019-07-10 LAB — PHOSPHORUS: Phosphorus: 4.8 mg/dL — ABNORMAL HIGH (ref 2.5–4.6)

## 2019-07-10 LAB — PROCALCITONIN: Procalcitonin: <0.1 ng/mL

## 2019-07-10 MED ORDER — MAGNESIUM SULFATE IN D5W 1-5 GM/100ML-% IV SOLN
1.0000 g | Freq: Once | INTRAVENOUS | Status: AC
  Administered 2019-07-10: 1 g via INTRAVENOUS
  Filled 2019-07-10: qty 100

## 2019-07-10 MED ORDER — POTASSIUM CHLORIDE 10 MEQ/100ML IV SOLN
10.0000 meq | INTRAVENOUS | Status: AC
  Administered 2019-07-10 (×3): 10 meq via INTRAVENOUS
  Filled 2019-07-10 (×3): qty 100

## 2019-07-10 MED ORDER — SODIUM CHLORIDE 0.9 % IV BOLUS
500.0000 mL | Freq: Once | INTRAVENOUS | Status: AC
  Administered 2019-07-10: 500 mL via INTRAVENOUS

## 2019-07-10 MED ORDER — METOPROLOL TARTRATE 5 MG/5ML IV SOLN
5.0000 mg | INTRAVENOUS | Status: DC | PRN
  Administered 2019-07-10 (×2): 5 mg via INTRAVENOUS
  Filled 2019-07-10 (×2): qty 5

## 2019-07-10 MED ORDER — ACETAMINOPHEN 650 MG RE SUPP
650.0000 mg | Freq: Four times a day (QID) | RECTAL | Status: DC | PRN
  Administered 2019-07-10 – 2019-08-02 (×4): 650 mg via RECTAL
  Filled 2019-07-10 (×6): qty 1

## 2019-07-10 MED ORDER — METOPROLOL TARTRATE 5 MG/5ML IV SOLN
5.0000 mg | INTRAVENOUS | Status: DC | PRN
  Administered 2019-07-10 (×2): 5 mg via INTRAVENOUS
  Filled 2019-07-10 (×3): qty 5

## 2019-07-10 NOTE — Progress Notes (Signed)
Report given to RN on 6E. 

## 2019-07-10 NOTE — Clinical Social Work Note (Signed)
CK faxed to [facility]. Confirmed pt remains on [facility] List until medically stable.  [place], [place]

## 2019-07-10 NOTE — Procedures (Addendum)
Patient Name: Veronica Arias  MRN: [id number]  Epilepsy Attending: Lora Havens  Referring Physician/Provider: Dr Karie Kirks Date: 07/10/2019 Duration: 27.41 mins  Patient history: 28yo F with bizzare behavior and rhabdomyolysis. EEG to evaluate for seizures.  Level of alertness: awake  Technical aspects: This EEG study was done with scalp electrodes positioned according to the 10-20 International system of electrode placement. Electrical activity was acquired at a sampling rate of 500Hz  and reviewed with a high frequency filter of 70Hz  and a low frequency filter of 1Hz . EEG data were recorded continuously and digitally stored.   DESCRIPTION: The posterior dominant rhythm consists of 9-10 Hz activity of moderate voltage (25-35 uV) seen predominantly in posterior head regions, symmetric and reactive to eye opening and eye closing. There is an excessive amount of 15 to 18 Hz, 2-3 uV beta activity with irregular morphology distributed symmetrically and diffusely. Hyperventilation and photic stimulation were not performed.  IMPRESSION: This study is within normal limits. No seizures or epileptiform discharges were seen throughout the recording.   The excessive beta activity seen in the background is most likely due to the effect of benzodiazepine and is a benign EEG pattern.   [name] [name]

## 2019-07-10 NOTE — Significant Event (Signed)
Rapid Response Event Note  Overview: Called d/t HR-140s and MEWS-5. Pt's HR has been 120s-130s and MEWS has been 2-3 since admission. Pt has been given 5mg  metoprolol x 2 and 1.5L NS boluses for HR since 1800 for tachycardia.  Time Called: 0014 Arrival Time: 0017 Event Type: Cardiac, MEWS  Initial Focused Assessment: Pt laying in bed with eyes closed, pt will not follow commands or speak. Pt will move all extremities. Pt will not let RN open her eyes to check her pupils. When stimulated, pt will get very stiff/rigid and began gagging on secretions.  Pts skin hot to touch. T-100.8(oral)-confirmed with 100.6(R), HR-140s(SR), BP-172/95, RR-18, SpO2-98% on RA. Lungs diminished t/o. Abd soft. Foley present with clear yellow urine.   Interventions: Rectal temp, 500cc NS bolus X 2, metoprolol changed to 5mg  q2h IV for HR>130.   K-3, Mg-1.6-Orders for 1g Mg and 4 runs KCL. Will transfer to progressive care for closer monitoring. Plan of Care (if not transferred):  Event Summary: Name of Physician Notified: Baltazar Najjar, NP at [place]    at    Outcome: Transferred (Comment)(Progressive care unit)  Event End Time: 0120  Dillard Essex

## 2019-07-10 NOTE — Progress Notes (Signed)
PROGRESS NOTE  Veronica BaileyKyla Arias ZOX:[id number] DOB: [birth year] DOA: 07/01/2019 PCP: Patient, No Pcp Per  Brief History   Veronica Stimpsonis a 28 y.o.femalewith medical history significant ofasthma who presented to the ED on 07/01/2019 for evaluation of suicidal ideation, paranoia, and bizarre behavior. Patient had recent ED visit 2 days ago for similar behavior. Per police report, she was found on the balcony of her apartment throwing things and saying she was suicidal and was going to jump. On initial labs, no leukocytosis, blood ethanol level negative, acetaminophen level negative, salicylate level negative.Transaminases elevated (AST 76, ALT 145), remainder of LFTs normal. Beta-hCG negative. COVID 19 rapid test negative. TSH normal. UDS positive for benzodiazepines. Labs done on 07/03/2019 showing CK 2998 and has continued to worsen since then. On 8/13, CK 4536, blood glucose 63. Blood glucose subsequently improved. Today, CK uptrending 4011 >4037. Transaminases improved (AST 70, ALT 90). Head CT done today showing no acute intracranial abnormality. Patient has received a total of 6 L IV fluid boluses in the past 2 days. However, CK continue's to trend up and patient is persistently tachycardic with heart rate up to 130s. She has continued to be highly agitated throughout her ED stay for the past 4 days.Trying to pull out IV lines etc. Refusing p.o.'s. Patient has even managed to get out of soft restraints and has required security assistance to get her back to her room. Required multiple rounds of antipsychotics,benzodiazepines, diphenhydramineetc. for the past 4 days.Due to CK uptrending, psychiatry recommended stopping antipsychotics/ Zyprexaas they may be causing rhabdo. Accepted by inpatient psychiatry, awaiting bed placementand medical clearance.Internal medicine teaching service wascalledand declined hospital admission. [facility] callledto admit patient formgmt of  rhabdomyolysis, persistent tachycardia, agitation/paranoia/hallucinations.  On the evening of 07/09/2019 the patient had a rapid response called due to fever and tachycardia. She was moved to a telemetry floor. Mother is at bedside today and states that the patient was awake, but not herself earlier today.  Consultants   Neurology  Psychiatry  Procedures   EEG  Antibiotics   Anti-infectives (From admission, onward)   Start     Dose/Rate Route Frequency Ordered Stop   07/09/19 1900  Ampicillin-Sulbactam (UNASYN) 3 g in sodium chloride 0.9 % 100 mL IVPB     3 g 200 mL/hr over 30 Minutes Intravenous Every 8 hours 07/09/19 1845        Subjective  This morning upon my visit the patient is sleeping. She has rapid blinking of her eyes in response to stimulation. Mother states that she was awake and verbal earlier today. No fevers since early this morning.  Objective   Vitals:  Vitals:   07/10/19 0426 07/10/19 0930  BP: 127/66 (!) 147/91  Pulse: (!) 102 (!) 106  Resp:    Temp: 99.6 F (37.6 C) 98.3 F (36.8 C)  SpO2: 93% 95%    Exam:  Constitutional: The patient is sleeping soundly. She has rapid blinking of her eyes in response to stimulation. Respiratory:   No increased work of breathing  No wheezes, rales, or rhonchi.  No tactile fremitus. ENT:  The patient has bitten the lateral aspect of her tongue. Cardiovascular:   Tachycardic at greater than 100 BPM.  No murmurs, ectopy or gallups.  No lateral PMI. No thrills. Abdomen:   Abdomen is soft, non-tender, non-distended.  No hernias, masses, or organomegaly  Normoactive bowel sounds. Musculoskeletal:   No cyanosis clubbing or edema. Skin:   No rashes, lesions, ulcers  palpation of skin: no induration or  nodules Neurologic:   Patient is sleeping and unable to cooperate with exam. Psychiatric:  Patient is sleeping and unable to cooperate with exam.   I have personally reviewed the following:    Today's Data   Vitals, BMP, CBC  Other Data   EEG negative.  Scheduled Meds:  enoxaparin (LOVENOX) injection  40 mg Subcutaneous Daily   folic acid  1 mg Oral Daily   multivitamin with minerals  1 tablet Oral Daily   nicotine  21 mg Transdermal Q0600   thiamine  100 mg Oral Daily   Or   thiamine  100 mg Intravenous Daily   Continuous Infusions:  ampicillin-sulbactam (UNASYN) IV 3 g (07/10/19 1913)   dextrose 5 % with KCl 20 mEq / L 100 mL/hr at 07/10/19 0400   methocarbamol (ROBAXIN) IV      Principal Problem:   Rhabdomyolysis Active Problems:   Sinus tachycardia   Suicidal ideation   Psychosis (HCC)   LOS: 4 days   A & P  Rhabdomyolysis Likely secondary to severe agitation with antipsychotic medication and possibly secondary to restraints applied for agitation -Continue to hold Depakote for now and use IV lorazepam -Continue with IV fluid hydration but will change IV normal saline with rate of 150 mL's per hour to D5 +20 mEq of KCl given the patient's hypernatremia as well as hyperchloremia -Continue to monitor CK levels levels renal function and electrolytes; CK is trending down to 947 and at its highest was 4,536 -No clinical evidence of seizures but possibly pseudoseizures and obtain EEG to rule out and EEG showed a normal study within normal limits with no seizures or epileptiform discharges seen throughout the recording  Hypernatremia/Hyperchloremia slowly improving -Patient sodium trended up to 147 and chloride 115; today sodium is now 146 and chloride is 113 -Changed IV normal saline from 150 mL's per hour to D5W +20 mEq KCl at 75 mL's per hour -Continue to monitor and trend -Repeat CMP in the a.m.  Hypokalemia -Patient's potassium this morning was 3.2 -Replete with IV KCl 40 mEq -Continue with IV fluids as above and D5W +20 mEq of KCl at 75 mL's per hour -Repeat CMP in the a.m.  Abnormal LFTs, trending down -In the setting of  rhabdomyolysis next-avoid hepatotoxic medications -Patient's AST is now 37 and ALT is now 57 -Trending downwards and peak AST was 81 and peak ALT was 146 -Continue to monitor and trend  -Consider obtaining a right upper quadrant ultrasound as well as acute hepatitis panel if not improving or worsening -Repeat CMP in a.m.  Persistent Sinus Tachycardia -No fever or rigidity to suggest neuroleptic malignant syndrome. -Assess for possible underlying infectious etiology:Afebrile and no leukocytosis.  -Chest x-ray not suggestive of pneumonia and showed "Low inspiratory volumes with bibasilar atelectasis. No significant interval change compared to earlier this morning."  -No nuchal rigidity/meningeal signs to suggest meningitis.  -UA, urine culture not done so will reorder.  -Blood culture x2 showed NGTD at 2 Days. -TSH was 1.823 -C/w Cardiac monitoring and Telemetry. EKG in a.m. -Echocardiogram showed Normal EF and is below  -? Withdrawal from unknown substance   -IV Lopressor PRNHR >130 -Unclear if she has a history of alcohol abuse. Will keep on CIWA protocol/Ativan PRN. -UDS was Positive of Benzodiazepines  -patient was having acute urinary retention as may have been contributing to her tachycardia so we will place Foley catheter and monitor -Cardiology was consulted and Dr. Nechama Guard feels that her persistent rates within the 100s 120s  are is likely secondary to rhabdo and agitation psych aortic issues.  There is no structural heart disease on TTE and likely the treatment will be addressing underlying cause and no further cardiac work-up is recommended at this time -Dr. Gaynelle ArabianShuman recommends readdressing if tachycardic once medical and psych issues are resolved  Suicidal Ideation, Agitation/Psychosis -Psychiatry was following and recommended stopping Depakote and continuing Ativan for now  -Plan to admit to inpatient psych [facility] -Suicide precautions, sitter at  bedside -Check U/A and UCx to evaluate for any infectious cause of her Psychosis and also check UDS -Will Re-consult Psych given her aggressive Behavior and Psychosis -Continue to Monitor   -Continue one-to-one sitter  Renal insufficiency/AKI, worsening Acute Urinary Retention  -In the setting of rhabdomyolysis -Patient's BUN/creatinine went from 10/0.78 -> 7/1.25 -> 9/1.49 -Continue to monitor and trend renal function -Continue with IV fluid hydration as above -Check Urine Osm, Urine Cr, Urine Sodium -Check U/A and Urine Cx as Well  -His Foley catheter and she merely drained 2 L -Check renal ultrasound normal kidneys -Avoid nephrotoxic medications, contrast dyes, as well as hypotension -Repeat CMP in the a.m.  Hyperphosphatemia -Patient's phosphorus level this morning was 4.9 -Likely in the setting of acute kidney injury -Continue monitor and trend -Repeat phosphorus level in a.m.  Normocytic Anemia -Likely dilutional drop in the setting of IV fluid resuscitation -Hemoglobin/hematocrit is now 10.4/34.5 -Checked anemia panel and patient's iron level was 25, U IBC was 304, TIBC was 329, saturation ratios were 8%, ferritin was 66, folate level was 17.9, and vitamin B12 level was 1033 -Continue to monitor for signs and symptoms of bleeding; currently having her menses also bit her tongue this morning -Repeat CBC in a.m.  Obesity -Estimated body mass index is 36.31 kg/m as calculated from the following:   Height as of this encounter: 5\' 3"  (1.6 m).   Weight as of this encounter: 93 kg. -She is more awake we will provide weight loss counseling and dietary education  The patient was seen and examined by me. I have spent 35 minutes in her evaluation and care.  DVT prophylaxis: Enoxaparin 40 mg sq Daily  Code Status: FULL CODE  Family Communication: Discussed the patient with her mother today. All questions answered to the best of my ability. Disposition Plan: Inpatient  psychiatric hospitalization once she is medically stable and improved  Veronica Leiter, DO [facility] Direct contact: see [email or web address]  7PM-7AM contact night coverage as above 07/10/2019, 7:34 PM  LOS: 4 days

## 2019-07-10 NOTE — Progress Notes (Signed)
Patient transferred to [place], mom called to update- no answer.

## 2019-07-10 NOTE — Progress Notes (Signed)
SLP Cancellation Note  Patient Details Name: Marylon Verno MRN: [id number] DOB: 1991-09-29   Cancelled treatment:        Received orders for swallow assessment. Pt having EEG leads placed. RN reports pt has been very lethargic today and not alert enough for po's. Will initiate eval tomorrow.    Houston Siren 07/10/2019, 4:32 PM   Orbie Pyo Colvin Caroli.Ed Risk analyst [phone] Office [phone]

## 2019-07-10 NOTE — Progress Notes (Signed)
EEG complete - results pending 

## 2019-07-10 NOTE — Consult Note (Signed)
Attempted to see patient again today. Informed by patient's nurse Mickel Baas that patient is unable to participate in interview. She has been nonverbal/sedated today. Asked to notify psychiatry when patient is able to participate in interview.   Buford Dresser, DO 07/10/19 3:50 PM

## 2019-07-10 NOTE — Progress Notes (Addendum)
To f/up labs ordered by attending. K and Mg are low-replacing now. MEWS 4. HR up-giving bolus, has order for metoprolol prn HR over 130. Lactate 1.0. Being tx'd for aspiration PNA.  KJKG, NP [facility] Addendum: T 100.6 rectally. Rectal tylenol. Have given boluses without noted change in HR, but temp is contributing. TF pt to progressive care and change metoprolol to q2 prn. Cards has already been consulted for tachycardia. LA normal. Neuro status same as yesterday.  KJKG, NP [facility]

## 2019-07-11 ENCOUNTER — Inpatient Hospital Stay (HOSPITAL_COMMUNITY): Payer: BC Managed Care – PPO

## 2019-07-11 DIAGNOSIS — G9341 Metabolic encephalopathy: Secondary | ICD-10-CM

## 2019-07-11 LAB — CBC WITH DIFFERENTIAL/PLATELET
Abs Immature Granulocytes: 0.03 10*3/uL (ref 0.00–0.07)
Basophils Absolute: 0.1 10*3/uL (ref 0.0–0.1)
Basophils Relative: 1 %
Eosinophils Absolute: 0.1 10*3/uL (ref 0.0–0.5)
Eosinophils Relative: 1 %
HCT: 35 % — ABNORMAL LOW (ref 36.0–46.0)
Hemoglobin: 11 g/dL — ABNORMAL LOW (ref 12.0–15.0)
Immature Granulocytes: 0 %
Lymphocytes Relative: 18 %
Lymphs Abs: 1.6 10*3/uL (ref 0.7–4.0)
MCH: 26.3 pg (ref 26.0–34.0)
MCHC: 31.4 g/dL (ref 30.0–36.0)
MCV: 83.7 fL (ref 80.0–100.0)
Monocytes Absolute: 1 10*3/uL (ref 0.1–1.0)
Monocytes Relative: 12 %
Neutro Abs: 6 10*3/uL (ref 1.7–7.7)
Neutrophils Relative %: 68 %
Platelets: 349 10*3/uL (ref 150–400)
RBC: 4.18 MIL/uL (ref 3.87–5.11)
RDW: 13.2 % (ref 11.5–15.5)
WBC: 8.8 10*3/uL (ref 4.0–10.5)
nRBC: 0 % (ref 0.0–0.2)

## 2019-07-11 LAB — CK: Total CK: 501 U/L — ABNORMAL HIGH (ref 38–234)

## 2019-07-11 LAB — COMPREHENSIVE METABOLIC PANEL
ALT: 84 U/L — ABNORMAL HIGH (ref 0–44)
AST: 59 U/L — ABNORMAL HIGH (ref 15–41)
Albumin: 3.5 g/dL (ref 3.5–5.0)
Alkaline Phosphatase: 54 U/L (ref 38–126)
Anion gap: 15 (ref 5–15)
BUN: 8 mg/dL (ref 6–20)
CO2: 28 mmol/L (ref 22–32)
Calcium: 8.6 mg/dL — ABNORMAL LOW (ref 8.9–10.3)
Chloride: 96 mmol/L — ABNORMAL LOW (ref 98–111)
Creatinine, Ser: 1.44 mg/dL — ABNORMAL HIGH (ref 0.44–1.00)
GFR calc Af Amer: 58 mL/min — ABNORMAL LOW (ref >60)
GFR calc non Af Amer: 50 mL/min — ABNORMAL LOW (ref >60)
Glucose, Bld: 103 mg/dL — ABNORMAL HIGH (ref 70–99)
Potassium: 3 mmol/L — ABNORMAL LOW (ref 3.5–5.1)
Sodium: 139 mmol/L (ref 135–145)
Total Bilirubin: 1 mg/dL (ref 0.3–1.2)
Total Protein: 7.4 g/dL (ref 6.5–8.1)

## 2019-07-11 LAB — CULTURE, BLOOD (ROUTINE X 2)
Culture: NO GROWTH
Culture: NO GROWTH
Special Requests: ADEQUATE

## 2019-07-11 LAB — TSH: TSH: 2.021 u[IU]/mL (ref 0.350–4.500)

## 2019-07-11 LAB — T4, FREE: Free T4: 1.49 ng/dL — ABNORMAL HIGH (ref 0.61–1.12)

## 2019-07-11 LAB — PROCALCITONIN: Procalcitonin: <0.1 ng/mL

## 2019-07-11 MED ORDER — METOPROLOL TARTRATE 5 MG/5ML IV SOLN
5.0000 mg | Freq: Four times a day (QID) | INTRAVENOUS | Status: DC
  Administered 2019-07-11 – 2019-07-14 (×11): 5 mg via INTRAVENOUS
  Filled 2019-07-11 (×11): qty 5

## 2019-07-11 MED ORDER — LORAZEPAM 2 MG/ML IJ SOLN
1.0000 mg | Freq: Four times a day (QID) | INTRAMUSCULAR | Status: DC
  Administered 2019-07-11 – 2019-07-16 (×20): 1 mg via INTRAVENOUS
  Filled 2019-07-11 (×20): qty 1

## 2019-07-11 MED ORDER — LORAZEPAM 2 MG/ML IJ SOLN
1.0000 mg | Freq: Once | INTRAMUSCULAR | Status: AC | PRN
  Administered 2019-07-11: 19:00:00 1 mg via INTRAVENOUS
  Filled 2019-07-11: qty 1

## 2019-07-11 MED ORDER — METOPROLOL TARTRATE 25 MG PO TABS
25.0000 mg | ORAL_TABLET | Freq: Two times a day (BID) | ORAL | Status: DC
  Administered 2019-07-11: 25 mg via ORAL
  Filled 2019-07-11: qty 1

## 2019-07-11 MED ORDER — SODIUM CHLORIDE 0.45 % IV SOLN
INTRAVENOUS | Status: DC
  Administered 2019-07-11 – 2019-07-13 (×5): via INTRAVENOUS

## 2019-07-11 NOTE — Consult Note (Addendum)
Requesting Physician: Dr. Benny Lennert     Chief Complaint: Altered mental status.   History obtained from: Patient, Mother, and Chart    HPI:                                                                                                                                       Veronica Arias is a 28 y.o. female with a medical history of controlled asthma who presented to the ED 1.5 weeks ago with suicidal ideation. Patient mostly nonverbal and unable to provide any history. Mother at bedside. She states patient was in her usual state of health until the day she presented to the ED. She became confused suddenly and started having deja-vu moments. This progressed rapidly through the day and she eventually started throwing things down her porch and threatening to kill herself per EMS, mother denies suicidal ideation. She does not have a history of psychiatric illness nor does she take any medications at home other than a over-the-counter MVI.  Mother denies any illicit substance use, however her UDS on admission was positive for benzodiazepines. She is fully functional at baseline, is a high Education officer, museum.    In the ED she was found to have rhabdomyolysis and persistent tachycardia of unclear etiology and was admitted for further evaluation.  During her admission, she has remained encephalopathic and agitated at times requiring multiple doses of Haldol, Ativan, Benadryl, and Zyprexa.  She also developed acute urinary retention requiring Foley placement. Two days ago, she became febrile prompting an infectious workup which has been negative. She also had a head CT and EEG that did not show any acute intracranial pathology or seizure activity, respectively.  Psychiatry has been involved in her care, they recommended stopping all antipsychotics due to concern for NMS.    Past Medical History:  Diagnosis Date  . Asthma     Past Surgical History:  Procedure Laterality Date  . NO PAST SURGERIES      History  reviewed. No pertinent family history. Social History:  reports that she has never smoked. She has never used smokeless tobacco. She reports that she does not drink alcohol or use drugs.  Allergies: No Known Allergies  Medications:                                                                                                                        I reviewed  home medications. Only on MVI at home.    ROS:                                                                                                                                     14 systems reviewed and negative except above.    Examination:                                                                                                      General: Appears well-developed and well-nourished.  Psych: Flat affect, mostly nonverbal, echolalia  Eyes: No scleral injection HENT: No OP obstrucion Head: Normocephalic.  Cardiovascular: Tachycardic and regular rhythm.  Respiratory: Effort normal and breath sounds normal to anterior ascultation GI: Soft.  No distension. There is no tenderness.  Skin: WDI    Neurological Examination Mental Status: Alert, mostly nonverbal, when she does speak she repeats what other people are saying or has nonsensical speech, she does answer some questions appropriately at times, thought content appropriate. Able to follow 1 step commands some times  Cranial Nerves: II: unable test  III,IV, VI: ptosis not present, extra-ocular motions intact bilaterally, pupils equal, round, reactive to light and accommodation V,VII: does not follow command for this VIII: hearing normal bilaterally IX,X: uvula rises symmetrically XI: does not follow command for this XII: midline tongue extension Motor: Right : Upper extremity   5/5    Left:     Upper extremity   5/5  Lower extremity   5/5     Lower extremity   5/5 Tone and bulk:increased tone throughout; no atrophy noted Sensory: Pinprick and light touch intact  throughout, bilaterally Deep Tendon Reflexes: unable to test due to rigidity, no hyperreflexia noted  Plantars: Right: downgoing   Left: downgoing Cerebellar: unable to test Gait: unable to test   Lab Results: Basic Metabolic Panel: Recent Labs  Lab 07/06/19 0455  07/08/19 1008 07/09/19 0438 07/09/19 1844 07/10/19 0310 07/11/19 0441  NA 145   < > 147* 146* 146* 145 139  K 3.4*   < > 3.1* 3.2* 3.0* 3.2* 3.0*  CL 113*   < > 115* 113* 109 106 96*  CO2 18*   < > 22 22 24 26 28   GLUCOSE 71   < > 98 119* 114* 121* 103*  BUN 6   < > 7 9 8  5* 8  CREATININE 1.09*   < > 1.25* 1.49* 1.37* 1.27* 1.44*  CALCIUM 8.9   < > 8.9 9.0 9.4  8.9 8.6*  MG  --   --  1.8 2.0 1.6* 1.8  --   PHOS 5.1*  --  4.4 4.8* 4.9* 4.8*  --    < > = values in this interval not displayed.    CBC: Recent Labs  Lab 07/08/19 1008 07/09/19 0438 07/09/19 1844 07/10/19 0310 07/11/19 0441  WBC 6.8 6.8 8.1 8.7 8.8  NEUTROABS 4.1 4.3 5.1 5.9 6.0  HGB 10.6* 10.4* 11.3* 11.4* 11.0*  HCT 34.1* 34.5* 36.9 36.3 35.0*  MCV 84.2 85.4 84.1 83.4 83.7  PLT 331 364 416* 362 349    Coagulation Studies: No results for input(s): LABPROT, INR in the last 72 hours.  Imaging: Dg Chest Port 1 View  Result Date: 07/09/2019 CLINICAL DATA:  Shortness of breath EXAM: PORTABLE CHEST 1 VIEW COMPARISON:  December 03, 2013 FINDINGS: Lung volumes are low. There is no pneumothorax. No large pleural effusion. There is no acute osseous abnormality. The heart size is normal. IMPRESSION: Low lung volumes, otherwise no acute cardiopulmonary process. Electronically Signed   By: Katherine Mantlehristopher  Green M.D.   On: 07/09/2019 18:53    I have reviewed the above imaging.    ASSESSMENT AND PLAN   This is a 28 year-old female with controlled asthma who presented to the ED with suicidal ideations and was found to have rhabdomyolysis as well as persistent tachycardia of unclear etiology. Her hospital course has been complicated by electrolyte  derangements now resolved, persistent altered mental status, and urinary retention requiring placement of Foley catheter. Most recently, she developed a low grade fever 2 days ago and was started on Unasyn, though infectious workup is negative thus far. Has been afebrile since then. CT head was negative for acute intracranial pathology and an EEG that did not show any seizure activity. We are consulted today for evaluation of persistent encephalopathy and agitation. Differential diagnosis includes medication side effect (has received Haldol, Benadryl, Ativan, and Zyprexa several times during admission, all stopped 2 days ago), NMS (hyperthermia, tachycardia, muscle rigidity, AMS) underlying psychiatric illness, withdrawal, paraneoplastic or autoimmune encephalitis,etc. Work up as below for further evaluation.   Impression:  Agitation, encephalopathy with muscle rigidity   - Ordered MRI brain  - Recommend LP tomorrow if normal MRI   - Continue supportive care with IVF + avoid anti-psychotic, anti-cholinergic, anti-emetic, and serotonergic agents     Final recommendations per neurology attending, Dr. Laurence SlateAroor.   Burna CashIdalys Santos-Sanchez, MD  Internal Medicine PGY-3  P [phone]   NEUROHOSPITALIST ADDENDUM Performed a face to face diagnostic evaluation.   I have reviewed the contents of history and physical exam as documented by PA/ARNP/Resident and agree with above documentation.  I have discussed and formulated the above plan as documented. Edits to the note have been made as needed.  28 year old female with no previous psychiatric history presents with 1 and 1/2 weeks of acute psychiatric history.  [name] was not at bedside at the time of my assessment but history was obtained by resident who talked to the mother who has been in  the room with the patient earlier in the day.  Per mother, symptoms initially started with dj vu moments and confusion that rapidly progressed throughout the  day into suicidal ideations.  [facility] and UDS was positive for benzodiazepines although patient is not on any home benzos.  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  She was in the  ED in restraints for 4 days due to agitation and developed rhabdomyolysis.  Received multiple antipsychotics including Zyprexa, benzodiazepines diphenhydramine etc.  Patient then admitted for worsening rhabdomyolysis and persistent tachycardia and agitation/hallucinations.  Antipsychotics were discontinued due to concern for NMS. Work-up included head CT which was negative.  EEG was also negative. Patient currently has increased rigidity throughout 2 beats of clonus in the right lower leg.  She is verbally not responsive and not following any commands.  Catatonic on exam as she will continue to keep her arm raised for several minutes  Given her presentation in the absence of previous psych history I do think that we need to rule out CNS pathology causing her hallucinations, suicidal ideations.  All current rigidity, mildly elevated temperature may be due to NMS, will hold off starting dantrolene.  Recommend scheduled benzos 1 mg every 6 hourly.  Recommend MRI brain to assess for limbic encephalitis, meningitis etc. we are strongly considering performing an LP to look for pleocytosis, elevated protein and possibly include studies such as paraneoplastic panel, autoimmune encephalitis panel.       Georgiana SpinnerSushanth Montia Haslip MD [facility] [phone]   If 7pm to 7am, please call on call as listed on AMION.

## 2019-07-11 NOTE — Progress Notes (Signed)
SLP Cancellation Note  Patient Details Name: Veronica Arias MRN: [id number] DOB: 02/26/1991   Cancelled treatment:       Reason Eval/Treat Not Completed: Patient declined, no reason specified. Patient awake but head turned to side and active spillage of oral secretions with no attempt by patient to spit or swallow. SLP cleaned her mouth with a  Washcloth and she then smiled, opened her eyes and started mouthing words, but was not directed at SLP. When SLP attempted to give her sip of water, she would close her eyes and not permit. This was the second attempt for BSE, SLP will s/o on this patient, as she is on a diet but refusing. If she starts to take PO's and exhibits s/s aspiration or difficulty, please reorder. Thank you for this consult.   Sonia Baller, MA, CCC-SLP [facility] Pager: [phone]

## 2019-07-11 NOTE — Progress Notes (Signed)
PROGRESS NOTE  Sudie BaileyKyla Menning ZOX:[id number] DOB: [birth year] DOA: 07/01/2019 PCP: Patient, No Pcp Per  Brief History   Veronica Stimpsonis a 28 y.o.femalewith medical history significant ofasthma who presented to the ED on 07/01/2019 for evaluation of suicidal ideation, paranoia, and bizarre behavior. Patient had recent ED visit 2 days ago for similar behavior. Per police report, she was found on the balcony of her apartment throwing things and saying she was suicidal and was going to jump. On initial labs, no leukocytosis, blood ethanol level negative, acetaminophen level negative, salicylate level negative.Transaminases elevated (AST 76, ALT 145), remainder of LFTs normal. Beta-hCG negative. COVID 19 rapid test negative. TSH normal. UDS positive for benzodiazepines. Labs done on 07/03/2019 showing CK 2998 and has continued to worsen since then. On 8/13, CK 4536, blood glucose 63. Blood glucose subsequently improved. Today, CK uptrending 4011 >4037. Transaminases improved (AST 70, ALT 90). Head CT done today showing no acute intracranial abnormality. Patient has received a total of 6 L IV fluid boluses in the past 2 days. However, CK continue's to trend up and patient is persistently tachycardic with heart rate up to 130s. She has continued to be highly agitated throughout her ED stay for the past 4 days.Trying to pull out IV lines etc. Refusing p.o.'s. Patient has even managed to get out of soft restraints and has required security assistance to get her back to her room. Required multiple rounds of antipsychotics,benzodiazepines, diphenhydramineetc. for the past 4 days.Due to CK uptrending, psychiatry recommended stopping antipsychotics/ Zyprexaas they may be causing rhabdo. Accepted by inpatient psychiatry, awaiting bed placementand medical clearance.Internal medicine teaching service wascalledand declined hospital admission. [facility] callledto admit patient formgmt of  rhabdomyolysis, persistent tachycardia, agitation/paranoia/hallucinations.  On the evening of 07/09/2019 the patient had a rapid response called due to fever and tachycardia. She was moved to a telemetry floor. Mother is at bedside today and states that the patient was awake, but not herself earlier today.  Consultants   Neurology  Psychiatry  Procedures   EEG  Antibiotics   Anti-infectives (From admission, onward)   Start     Dose/Rate Route Frequency Ordered Stop   07/09/19 1900  Ampicillin-Sulbactam (UNASYN) 3 g in sodium chloride 0.9 % 100 mL IVPB     3 g 200 mL/hr over 30 Minutes Intravenous Every 8 hours 07/09/19 1845       Subjective  Today the patient is sitting up in bed with her eyes open. Her mother is trying to give her her medication which has been crushed in applesauce, but the patient is not taking it.   Objective   Vitals:  Vitals:   07/11/19 1701 07/11/19 1806  BP: (!) 145/92 119/81  Pulse: (!) 113 (!) 113  Resp:  15  Temp: 99.5 F (37.5 C) 100.1 F (37.8 C)  SpO2: 98% 99%    Exam:  Constitutional: The patient is awake, but not interactive. Not taking po. Not verbal. No acute distress. Respiratory:   No increased work of breathing  No wheezes, rales, or rhonchi.  No tactile fremitus. ENT:  The patient has bitten the lateral aspect of her tongue. Cardiovascular:   Tachycardic at greater than 100 BPM.  No murmurs, ectopy or gallups.  No lateral PMI. No thrills. Abdomen:   Abdomen is soft, non-tender, non-distended.  No hernias, masses, or organomegaly  Normoactive bowel sounds. Musculoskeletal:   No cyanosis clubbing or edema. Skin:   No rashes, lesions, ulcers  palpation of skin: no induration or nodules Neurologic:  Patient is sleeping and unable to cooperate with exam. Psychiatric:  Patient is sleeping and unable to cooperate with exam.   I have personally reviewed the following:   Today's Data   Vitals, BMP,  CBC  Other Data   EEG negative.  Scheduled Meds:  enoxaparin (LOVENOX) injection  40 mg Subcutaneous Daily   folic acid  1 mg Oral Daily   LORazepam  1 mg Intravenous Q6H   metoprolol tartrate  5 mg Intravenous Q6H   multivitamin with minerals  1 tablet Oral Daily   nicotine  21 mg Transdermal Q0600   thiamine  100 mg Oral Daily   Or   thiamine  100 mg Intravenous Daily   Continuous Infusions:  sodium chloride 150 mL/hr at 07/11/19 1258   ampicillin-sulbactam (UNASYN) IV 3 g (07/11/19 1301)   methocarbamol (ROBAXIN) IV      Principal Problem:   Rhabdomyolysis Active Problems:   Sinus tachycardia   Suicidal ideation   Psychosis (HCC)   LOS: 5 days   A & P  Rhabdomyolysis: Resolving slowly. I have increased her IV fluids. Monitor CK. Likely secondary to severe agitation with antipsychotic medication and possibly secondary to restraints applied for agitation. No clinical evidence of seizures but possibly pseudoseizures and obtain EEG to rule out and EEG showed a normal study within normal limits with no seizures or epileptiform discharges seen throughout the recording.  Hypernatremia/Hyperchloremia: Increase IV fluid rate. Pt is taking no PO. Monitor.   Hypokalemia: 3.0 this morning. Supplement and monitor.  Abnormal LFTs: Likely related to rhabdomyolysis. Avoid hepatotoxic medications. No abdominal pain on examination. In the setting of rhabdomyolysis next-avoid hepatotoxic medications. Monitor and trend.  Persistent Sinus Tachycardia: Monitor on telemetry. Echocardiogram showed Normal EF. Metoprolol was increased, but the patient is refusing PO. She has been started on IV lopressor. Low grade fever yesterday. Antipsychotics stopped. No infectious etiology has been determined. Neurology will perform MRI and perhaps an LP if MRI does not yield answers.No nuchal rigidity/meningeal signs to suggest meningitis. Atelectasis on CXR may be cause for low grade fevers.  Cardiology was consulted and Dr. Nechama Guard feels that her persistent rates within the 100s 120s are is likely secondary to rhabdo and agitation psych aortic issues.  There is no structural heart disease on TTE and likely the treatment will be addressing underlying cause and no further cardiac work-up is recommended at this time. TSH within normal limits.  Low grade fevers with sinus tachycardia: Infectious cause? UA negative and urine cultures are negative thus far. Blood cultures negative CXR demonstrated only atelectasis. Consider possible withdrawal from substance? UDS positive only for benzoes. Dr. Lorraine Lax from neurology is consulting. I appreciate his help.   Suicidal Ideation, Agitation/Psychosis: The patient has been evaluated by psychiatry. They have recommended stopping Depakote and continuing Ativan for now. They plan to admit to inpatient psych after medical clearance at [facility]. Suicide precautions, sitter at bedside. Continue to Monitor. Continue 1:1 sitter.   Renal insufficiency/AKI, worsening slowly: Due to rhabdomyolysis and urinary retention and patient not accepting PO. Will increase IV fluids. Avoid nephrotoxic substances and hypotension. Monitor. 1.44. Today.  Renal ultrasound is negative. Foley catheter is in place.  Hyperphosphatemia: Due to AKI and poor hydration. IV fluids increased. Monitor.  Normocytic Anemia: Hemoglobin stable for last couple of days at 11.3 and 11.0. Monitor.Likely dilutional drop in the setting of IV fluid resuscitation. Checked anemia panel and patient's iron level was 25, U IBC was 304, TIBC was 329, saturation ratios were  8%, ferritin was 66, folate level was 17.9, and vitamin B12 level was 1033. Monitor.  Obesity:  Estimated body mass index is 36.31 kg/m as calculated from the following. Once recovered the patient should seek assistance from per PCP for a sensible weight loss plan.  The patient was seen and examined by me. I have spent 42 minutes in her  evaluation and care.  DVT prophylaxis: Enoxaparin 40 mg sq Daily  Code Status: FULL CODE  Family Communication: Discussed the patient with her mother today. All questions answered to the best of my ability. Disposition Plan: Inpatient psychiatric hospitalization once she is medically stable and improved  Jazleen Robeck, DO [facility] Direct contact: see [email or web address]  7PM-7AM contact night coverage as above 07/11/2019, 7:22 PM  LOS: 4 days

## 2019-07-11 NOTE — Significant Event (Signed)
Rapid Response Event Note  Called d/t [id number]. Pt is tachycardic at baseline and mental status unchanged from previous assessments. Please call RRT if further assistance needed.   Veronica Arias

## 2019-07-12 ENCOUNTER — Inpatient Hospital Stay (HOSPITAL_COMMUNITY): Payer: BC Managed Care – PPO

## 2019-07-12 LAB — CBC WITH DIFFERENTIAL/PLATELET
Abs Immature Granulocytes: 0.02 10*3/uL (ref 0.00–0.07)
Basophils Absolute: 0.1 10*3/uL (ref 0.0–0.1)
Basophils Relative: 1 %
Eosinophils Absolute: 0.1 10*3/uL (ref 0.0–0.5)
Eosinophils Relative: 1 %
HCT: 33.7 % — ABNORMAL LOW (ref 36.0–46.0)
Hemoglobin: 10.6 g/dL — ABNORMAL LOW (ref 12.0–15.0)
Immature Granulocytes: 0 %
Lymphocytes Relative: 28 %
Lymphs Abs: 2.3 10*3/uL (ref 0.7–4.0)
MCH: 26.2 pg (ref 26.0–34.0)
MCHC: 31.5 g/dL (ref 30.0–36.0)
MCV: 83.2 fL (ref 80.0–100.0)
Monocytes Absolute: 0.9 10*3/uL (ref 0.1–1.0)
Monocytes Relative: 11 %
Neutro Abs: 4.9 10*3/uL (ref 1.7–7.7)
Neutrophils Relative %: 59 %
Platelets: 302 10*3/uL (ref 150–400)
RBC: 4.05 MIL/uL (ref 3.87–5.11)
RDW: 12.7 % (ref 11.5–15.5)
WBC: 8.3 10*3/uL (ref 4.0–10.5)
nRBC: 0 % (ref 0.0–0.2)

## 2019-07-12 LAB — PROTIME-INR
INR: 1.1 (ref 0.8–1.2)
Prothrombin Time: 13.6 seconds (ref 11.4–15.2)

## 2019-07-12 LAB — COMPREHENSIVE METABOLIC PANEL
ALT: 78 U/L — ABNORMAL HIGH (ref 0–44)
AST: 47 U/L — ABNORMAL HIGH (ref 15–41)
Albumin: 3.2 g/dL — ABNORMAL LOW (ref 3.5–5.0)
Alkaline Phosphatase: 52 U/L (ref 38–126)
Anion gap: 13 (ref 5–15)
BUN: 11 mg/dL (ref 6–20)
CO2: 27 mmol/L (ref 22–32)
Calcium: 8.5 mg/dL — ABNORMAL LOW (ref 8.9–10.3)
Chloride: 99 mmol/L (ref 98–111)
Creatinine, Ser: 1.32 mg/dL — ABNORMAL HIGH (ref 0.44–1.00)
GFR calc Af Amer: >60 mL/min (ref >60)
GFR calc non Af Amer: 56 mL/min — ABNORMAL LOW (ref >60)
Glucose, Bld: 85 mg/dL (ref 70–99)
Potassium: 3.1 mmol/L — ABNORMAL LOW (ref 3.5–5.1)
Sodium: 139 mmol/L (ref 135–145)
Total Bilirubin: 0.9 mg/dL (ref 0.3–1.2)
Total Protein: 6.9 g/dL (ref 6.5–8.1)

## 2019-07-12 LAB — APTT: aPTT: 31 seconds (ref 24–36)

## 2019-07-12 LAB — T3, FREE: T3, Free: 3.2 pg/mL (ref 2.0–4.4)

## 2019-07-12 LAB — CK: Total CK: 962 U/L — ABNORMAL HIGH (ref 38–234)

## 2019-07-12 MED ORDER — POTASSIUM CHLORIDE 10 MEQ/100ML IV SOLN
10.0000 meq | INTRAVENOUS | Status: AC
  Administered 2019-07-12 (×4): 10 meq via INTRAVENOUS
  Filled 2019-07-12 (×3): qty 100

## 2019-07-12 MED ORDER — GADOBUTROL 1 MMOL/ML IV SOLN
9.0000 mL | Freq: Once | INTRAVENOUS | Status: AC | PRN
  Administered 2019-07-12: 9 mL via INTRAVENOUS

## 2019-07-12 MED ORDER — POTASSIUM CHLORIDE 10 MEQ/100ML IV SOLN
INTRAVENOUS | Status: AC
  Administered 2019-07-12: 17:00:00 10 meq
  Filled 2019-07-12: qty 100

## 2019-07-12 NOTE — Progress Notes (Addendum)
Received call from radiology stating lumbar puncture needed to be performed at bedside before performed in radiology. Paged Frederico Hamman, MD. Waiting for response. Will continue to monitor.

## 2019-07-12 NOTE — Progress Notes (Signed)
Spoke with Aroor,MD over the phone. Stated that due to patient's status, Fluoro guided Lumbar puncture with radiology needed to be performed. Aroor, MD stated this would be performed tomorrow (07/13/19).

## 2019-07-12 NOTE — Progress Notes (Signed)
Spoke with Frederico Hamman, MD and she staed I need to contact Aroor,MD concerning lumbar puncture. Paged Aroor, MD. Will wait for response and continue to monitor.

## 2019-07-12 NOTE — Progress Notes (Signed)
Pharmacy Antibiotic Note  Veronica Arias is a 28 y.o. female admitted on 07/01/2019 with psychosis and rhabdomyolysis now with concern for aspiration pneumonia.  Pharmacy has been consulted for Unasyn dosing.  Today is day #4 of therapy.  Renal function is improving, afebrile, WBC WNL, PCT < 0.1.  Plan: Continue Unasyn 3gm IV Q8H Monitor renal fxn, clinical progress F/U abx LOT   Height: 5\' 3"  (160 cm) Weight: 196 lb (88.9 kg) IBW/kg (Calculated) : 52.4  Temp (24hrs), Avg:99.2 F (37.3 C), Min:98.1 F (36.7 C), Max:100.1 F (37.8 C)  Recent Labs  Lab 07/09/19 0438 07/09/19 1844 07/09/19 2122 07/10/19 0310 07/11/19 0441 07/12/19 0330  WBC 6.8 8.1  --  8.7 8.8 8.3  CREATININE 1.49* 1.37*  --  1.27* 1.44* 1.32*  LATICACIDVEN  --  0.8 1.0  --   --   --     Estimated Creatinine Clearance: 68.3 mL/min (A) (by C-G formula based on SCr of 1.32 mg/dL (H)).    No Known Allergies  Unasyn 8/18>>  8/10 COVID: neg 8/15 BCx: negF 8/18 BCx: ngtd 8/18 UCx: negF  Veronica Arias D. Mina Marble, PharmD, BCPS, [place] 07/12/2019, 10:54 AM

## 2019-07-12 NOTE — Progress Notes (Signed)
Pt MEWS score increased. Charge nurse Robyn, RN notified and Vernie Shanks, RN spoke with Benny Lennert, DO about patient status and plan of care. Will continue to monitor.

## 2019-07-12 NOTE — Progress Notes (Signed)
Foley catheter removed per protocol. Charge nurse Bailey Mech, RN aware. Will continue to monitor.

## 2019-07-12 NOTE — Progress Notes (Signed)
PROGRESS NOTE  Veronica Arias IZT:[id number] DOB: Jun 10, 1991 DOA: 07/01/2019 PCP: Patient, No Pcp Per  Brief History   Ajwa Stimpsonis a 28 y.o.femalewith medical history significant ofasthma who presented to the ED on 07/01/2019 for evaluation of suicidal ideation, paranoia, and bizarre behavior. Patient had recent ED visit 2 days ago for similar behavior. Per police report, she was found on the balcony of her apartment throwing things and saying she was suicidal and was going to jump. On initial labs, no leukocytosis, blood ethanol level negative, acetaminophen level negative, salicylate level negative.Transaminases elevated (AST 76, ALT 145), remainder of LFTs normal. Beta-hCG negative. COVID 19 rapid test negative. TSH normal. UDS positive for benzodiazepines. Labs done on 07/03/2019 showing CK 2998 and has continued to worsen since then. On 8/13, CK 4536, blood glucose 63. Blood glucose subsequently improved. Today, CK uptrending 4011 >4037. Transaminases improved (AST 70, ALT 90). Head CT done today showing no acute intracranial abnormality. Patient has received a total of 6 L IV fluid boluses in the past 2 days. However, CK continue's to trend up and patient is persistently tachycardic with heart rate up to 130s. She has continued to be highly agitated throughout her ED stay for the past 4 days.Trying to pull out IV lines etc. Refusing p.o.'s. Patient has even managed to get out of soft restraints and has required security assistance to get her back to her room. Required multiple rounds of antipsychotics,benzodiazepines, diphenhydramineetc. for the past 4 days.Due to CK uptrending, psychiatry recommended stopping antipsychotics/ Zyprexaas they may be causing rhabdo. Accepted by inpatient psychiatry, awaiting bed placementand medical clearance.Internal medicine teaching service wascalledand declined hospital admission. [facility] callledto admit patient formgmt of  rhabdomyolysis, persistent tachycardia, agitation/paranoia/hallucinations.  On the evening of 07/09/2019 the patient had a rapid response called due to fever and tachycardia. She was moved to a telemetry floor. Mother is at bedside today and states that the patient was awake, but not herself earlier today. EEG negative for seizure.   Neurology was consulted. MRI was negative. He plans to do LP under fluoroscopy iin am.  Consultants   Neurology  Psychiatry  Procedures   EEG  Antibiotics   Anti-infectives (From admission, onward)   Start     Dose/Rate Route Frequency Ordered Stop   07/09/19 1900  Ampicillin-Sulbactam (UNASYN) 3 g in sodium chloride 0.9 % 100 mL IVPB     3 g 200 mL/hr over 30 Minutes Intravenous Every 8 hours 07/09/19 1845       Subjective  Today the patient is sleeping. Rapid eye movements occurring. Sitter reports that the patient will stare at the TV sometimes, but is minimally verbal when awake.  Objective   Vitals:  Vitals:   07/12/19 1346 07/12/19 1540  BP: 137/87 (!) 149/80  Pulse: (!) 113 (!) 123  Resp: 20 20  Temp: 99.2 F (37.3 C) 99 F (37.2 C)  SpO2: 98% 99%    Exam:  Constitutional: The patient is sleeping. No acute distress. Respiratory:   No increased work of breathing  No wheezes, rales, or rhonchi.  No tactile fremitus. ENT:  The patient has bitten the lateral aspect of her tongue. Cardiovascular:   Tachycardic at greater than 100 BPM.  No murmurs, ectopy or gallups.  No lateral PMI. No thrills. Abdomen:   Abdomen is soft, non-tender, non-distended.  No hernias, masses, or organomegaly  Normoactive bowel sounds. Musculoskeletal:   No cyanosis clubbing or edema. Skin:   No rashes, lesions, ulcers  palpation of skin: no induration  or nodules Neurologic:   Patient is sleeping and unable to cooperate with exam. Psychiatric:  Patient is sleeping and unable to cooperate with exam.   I have personally reviewed  the following:   Today's Data   Vitals, BMP, CBC  Other Data   EEG negative.  Scheduled Meds:  folic acid  1 mg Oral Daily   LORazepam  1 mg Intravenous Q6H   metoprolol tartrate  5 mg Intravenous Q6H   multivitamin with minerals  1 tablet Oral Daily   nicotine  21 mg Transdermal Q0600   thiamine  100 mg Oral Daily   Or   thiamine  100 mg Intravenous Daily   Continuous Infusions:  sodium chloride 150 mL/hr at 07/11/19 2255   ampicillin-sulbactam (UNASYN) IV 3 g (07/12/19 1234)    Principal Problem:   Rhabdomyolysis Active Problems:   Sinus tachycardia   Suicidal ideation   Psychosis (HCC)   LOS: 6 days   A & P  Rhabdomyolysis: Resolving slowly. I have increased her IV fluids. Monitor CK. Likely secondary to severe agitation with antipsychotic medication and possibly secondary to restraints applied for agitation. No clinical evidence of seizures but possibly pseudoseizures and obtain EEG to rule out and EEG showed a normal study within normal limits with no seizures or epileptiform discharges seen throughout the recording.  Hypernatremia/Hyperchloremia: Increase IV fluid rate. Pt is taking no PO. Monitor.   Hypokalemia: 3.0 this morning. Supplement and monitor.  Abnormal LFTs: Likely related to rhabdomyolysis. Avoid hepatotoxic medications. No abdominal pain on examination. In the setting of rhabdomyolysis next-avoid hepatotoxic medications. Monitor and trend.  Persistent Sinus Tachycardia: Monitor on telemetry. Echocardiogram showed Normal EF. Metoprolol was increased, but the patient is refusing PO. She has been started on IV lopressor.Cardiology was consulted and Dr. Gaynelle ArabianShuman feels that her persistent rates within the 100s 120s are is likely secondary to rhabdo and agitation psych aortic issues.  There is no structural heart disease on TTE and likely the treatment will be addressing underlying cause and no further cardiac work-up is recommended at this time.  TSH within normal limits.  Decreased level of consciousness/agitation:  Low grade fever yesterday. Antipsychotics stopped. No infectious etiology has been determined. Neurology ordered MRI which was unremarkable. He plans on doing LP under fluoro tomorrow. No nuchal rigidity/meningeal signs to suggest meningitis. Atelectasis on CXR may be cause for low grade fevers.   Low grade fevers with sinus tachycardia: Infectious cause? UA negative and urine cultures are negative thus far. Blood cultures negative CXR demonstrated only atelectasis. Consider possible withdrawal from substance? UDS positive only for benzoes. Dr. Laurence SlateAroor from neurology is consulting. I appreciate his help.   Suicidal Ideation, Agitation/Psychosis: The patient has been evaluated by psychiatry. They have recommended stopping Depakote and continuing Ativan for now. They plan to admit to inpatient psych [facility] Suicide precautions, sitter at bedside. Continue to Monitor. Continue 1:1 sitter.   Renal insufficiency/AKI, worsening slowly: Due to rhabdomyolysis and urinary retention and patient not accepting PO. Will increase IV fluids. Avoid nephrotoxic substances and hypotension. Monitor. 1.32. Today.  Renal ultrasound is negative. Foley catheter is in place.  Hyperphosphatemia: Due to AKI and poor hydration. IV fluids increased. Monitor.  Normocytic Anemia: Hemoglobin stable for last couple of days at 11.3 and 11.0. Monitor.Likely dilutional drop in the setting of IV fluid resuscitation. Checked anemia panel and patient's iron level was 25, U IBC was 304, TIBC was 329, saturation ratios were 8%, ferritin was 66, folate  level was 17.9, and vitamin B12 level was 1033. Monitor.  Obesity:  Estimated body mass index is 36.31 kg/m as calculated from the following. Once recovered the patient should seek assistance from per PCP for a sensible weight loss plan.  The patient was seen and examined by me. I have spent 32  minutes in her evaluation and care.  DVT prophylaxis: Enoxaparin 40 mg sq Daily  Code Status: FULL CODE  Family Communication: Discussed the patient with her mother today. All questions answered to the best of my ability. Disposition Plan: Inpatient psychiatric hospitalization once she is medically stable and improved  Yakir Wenke, DO [facility] Direct contact: see [email or web address]  7PM-7AM contact night coverage as above 07/12/2019, 4:55 PM  LOS: 4 days

## 2019-07-12 NOTE — Progress Notes (Addendum)
Reason for consult: Altered mental status  Subjective: No acute events overnight.  Started schedule IV Ativan every 6 hours for catatonia.  No improvement in mental status or exam this morning.  She is nonverbal and not following any commands this morning.   ROS: Unable to obtain due to poor mental status  Examination  Vital signs in last 24 hours: Temp:  [98.1 F (36.7 C)-100.1 F (37.8 C)] 99 F (37.2 C) (08/21 0940) Pulse Rate:  [105-126] 105 (08/21 0940) Resp:  [15-22] 18 (08/21 0940) BP: (80-162)/(56-98) 133/78 (08/21 0940) SpO2:  [96 %-100 %] 98 % (08/21 0940) Weight:  [88.9 kg] 88.9 kg (08/21 0645)  General: lying in bed, tremulous, and diaphoretic CVS: Tachycardic RS: breathing comfortably Extremities: Diaphoretic  Neuro: MS: Somnolent, does not arouse to verbal or sternal stimuli, does not follow commands, nonverbal CN: Exam limited by mental status, I was unable to open her eyes passively to assess pupils due to rigidity  Motor: tremulous on all 4 extremities, no clonus noted though difficult to elicit due to rigidity , keeps LLE and bilateral UE elevated   Reflexes: 2+ bilaterally over patella, biceps, plantars: flexor Coordination: not tested  Gait: not tested  Basic Metabolic Panel: Recent Labs  Lab 07/06/19 0455  07/08/19 1008 07/09/19 0438 07/09/19 1844 07/10/19 0310 07/11/19 0441 07/12/19 0330  NA 145   < > 147* 146* 146* 145 139 139  K 3.4*   < > 3.1* 3.2* 3.0* 3.2* 3.0* 3.1*  CL 113*   < > 115* 113* 109 106 96* 99  CO2 18*   < > 22 22 24 26 28 27   GLUCOSE 71   < > 98 119* 114* 121* 103* 85  BUN 6   < > 7 9 8  5* 8 11  CREATININE 1.09*   < > 1.25* 1.49* 1.37* 1.27* 1.44* 1.32*  CALCIUM 8.9   < > 8.9 9.0 9.4 8.9 8.6* 8.5*  MG  --   --  1.8 2.0 1.6* 1.8  --   --   PHOS 5.1*  --  4.4 4.8* 4.9* 4.8*  --   --    < > = values in this interval not displayed.    CBC: Recent Labs  Lab 07/09/19 0438 07/09/19 1844 07/10/19 0310 07/11/19 0441  07/12/19 0330  WBC 6.8 8.1 8.7 8.8 8.3  NEUTROABS 4.3 5.1 5.9 6.0 4.9  HGB 10.4* 11.3* 11.4* 11.0* 10.6*  HCT 34.5* 36.9 36.3 35.0* 33.7*  MCV 85.4 84.1 83.4 83.7 83.2  PLT 364 416* 362 349 302     Coagulation Studies: Recent Labs    07/12/19 0752  LABPROT 13.6  INR 1.1    Imaging Reviewed: MRI brain w/o contrast without acute intracranial abnormalities.    ASSESSMENT AND PLAN  Veronica Arias is a 28 year-old female with controlled asthma who presented to the ED with acute onset of encephalopathy and suicidal ideations and was found to have rhabdomyolysis as well as persistent tachycardia of unclear etiology. She has continued to deteriorate since admission and is now experiencing fever, diaphoresis, and catatonia on exam. MRI brain without contrast performed yesterday was negative.  Will order MRI brain with contrast for further evaluation (looking for any contrast-enhanced lesions, infectious process, inflammation). Also ordered autoimmune workup. Will plan for an LP by IR after MRI with contrast to look for pleocytosis, elevated protein, and send studies for paraneoplastic syndrome and autoimmune encephalitis.    Impression:  Agitation, encephalopathy with muscle rigidity and catatonia   -  MRI brain w/ contrast  - LP by IR Will discuss consent for LP with mother.  - Follow up autoimmune workup - Continue IV Ativan q6h   Final recommendations per neurology attending, Dr. Laurence Slate.  Burna CashIdalys Santos-Sanchez, MD  Internal Medicine PGY-3  P [phone]    NEUROHOSPITALIST ADDENDUM Performed a face to face diagnostic evaluation.   I have reviewed the contents of history and physical exam as documented by PA/ARNP/Resident and agree with above documentation.  I have discussed and formulated the above plan as documented. Edits to the note have been made as needed.  My assessment is slightly different than that of the resident's assessment.  Catatonia has improved and patient did  close eyes to command.  Tone has improved and she no longer has clonus. Still very encephalopathic.  MRI brain with and without contrast.  Showed no acute findings. (Contrasted MRI brain performed  After MRI brain without).  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  Given her acute psychiatric history looking for any underlying neurological condition such as autoimmune encephalitis such as NMDA encephalitis or voltage-gated potassium channelopthies.  Order for LP under fluoroscopy has been placed and CSF labs to be drawn have been ordered.  Also ordered autoimmune such as ANA, ANCA, RF, antiphospholipid syndrome, dsDNA.  Hepatitis B and C also ordered.  We will continue to follow.     Georgiana SpinnerSushanth  MD [facility] [phone]   If 7pm to 7am, please call on call as listed on AMION.

## 2019-07-12 NOTE — Progress Notes (Signed)
No response concerning lumbar puncture. Paged Joaquin Music, NP. Waiting for response so order can be changed and lumbar puncture can be performed.

## 2019-07-13 ENCOUNTER — Inpatient Hospital Stay (HOSPITAL_COMMUNITY): Payer: BC Managed Care – PPO

## 2019-07-13 DIAGNOSIS — R630 Anorexia: Secondary | ICD-10-CM

## 2019-07-13 LAB — ANA: Anti Nuclear Antibody (ANA): NEGATIVE

## 2019-07-13 LAB — BASIC METABOLIC PANEL
Anion gap: 16 — ABNORMAL HIGH (ref 5–15)
BUN: 13 mg/dL (ref 6–20)
CO2: 22 mmol/L (ref 22–32)
Calcium: 9 mg/dL (ref 8.9–10.3)
Chloride: 102 mmol/L (ref 98–111)
Creatinine, Ser: 1.19 mg/dL — ABNORMAL HIGH (ref 0.44–1.00)
GFR calc Af Amer: >60 mL/min (ref >60)
GFR calc non Af Amer: >60 mL/min (ref >60)
Glucose, Bld: 65 mg/dL — ABNORMAL LOW (ref 70–99)
Potassium: 3.8 mmol/L (ref 3.5–5.1)
Sodium: 140 mmol/L (ref 135–145)

## 2019-07-13 LAB — PROTEIN, CSF: Total  Protein, CSF: 15 mg/dL (ref 15–45)

## 2019-07-13 LAB — ANTI-SMITH ANTIBODY: ENA SM Ab Ser-aCnc: <0.2 AI (ref 0.0–0.9)

## 2019-07-13 LAB — RPR: RPR Ser Ql: NONREACTIVE

## 2019-07-13 LAB — GLUCOSE, CSF: Glucose, CSF: 56 mg/dL (ref 40–70)

## 2019-07-13 LAB — HEPATITIS C ANTIBODY: HCV Ab: <0.1 s/co ratio (ref 0.0–0.9)

## 2019-07-13 LAB — GLUCOSE, CAPILLARY
Glucose-Capillary: 68 mg/dL — ABNORMAL LOW (ref 70–99)
Glucose-Capillary: 156 mg/dL — ABNORMAL HIGH (ref 70–99)
Glucose-Capillary: 188 mg/dL — ABNORMAL HIGH (ref 70–99)
Glucose-Capillary: 88 mg/dL (ref 70–99)

## 2019-07-13 LAB — ANTI-DNA ANTIBODY, DOUBLE-STRANDED: ds DNA Ab: <1 IU/mL (ref 0–9)

## 2019-07-13 LAB — HEPATITIS B SURFACE ANTIBODY, QUANTITATIVE: Hepatitis B-Post: <3.1 m[IU]/mL — ABNORMAL LOW (ref >9.9)

## 2019-07-13 MED ORDER — DEXTROSE 50 % IV SOLN
25.0000 g | INTRAVENOUS | Status: AC
  Administered 2019-07-13: 25 g via INTRAVENOUS
  Filled 2019-07-13: qty 50

## 2019-07-13 MED ORDER — DEXTROSE-NACL 5-0.45 % IV SOLN
INTRAVENOUS | Status: DC
  Administered 2019-07-13 – 2019-08-01 (×57): via INTRAVENOUS

## 2019-07-13 NOTE — Progress Notes (Signed)
Back to room from IR s/p LP. Bedrest flat x 4 hours per protocol post procedure. Sitter into room at bedside. NAD. Remains unresponsive.

## 2019-07-13 NOTE — Progress Notes (Signed)
Pt was moved to 6E19 from 6E23 due to ants. Writer performed suicide precautions to the new room.

## 2019-07-13 NOTE — Progress Notes (Signed)
[name] notified of patient relocation to [phone].

## 2019-07-13 NOTE — Progress Notes (Signed)
Pt has not voided on her own since foley removed 8/21 @ 0930. I and O cath done x 3 since then. Last bladder scan @ = 900 ml with I and O volume = 800 ml. Dr Benny Lennert notified.

## 2019-07-13 NOTE — Progress Notes (Signed)
PROGRESS NOTE  Veronica BaileyKyla Arias BMW:[id number] DOB: 03-20-1991 DOA: 07/01/2019 PCP: Patient, No Pcp Per  Brief History   Veronica Stimpsonis a 28 y.o.femalewith medical history significant ofasthma who presented to the ED on 07/01/2019 for evaluation of suicidal ideation, paranoia, and bizarre behavior. Patient had recent ED visit 2 days ago for similar behavior. Per police report, she was found on the balcony of her apartment throwing things and saying she was suicidal and was going to jump. On initial labs, no leukocytosis, blood ethanol level negative, acetaminophen level negative, salicylate level negative.Transaminases elevated (AST 76, ALT 145), remainder of LFTs normal. Beta-hCG negative. COVID 19 rapid test negative. TSH normal. UDS positive for benzodiazepines. Labs done on 07/03/2019 showing CK 2998 and has continued to worsen since then. On 8/13, CK 4536, blood glucose 63. Blood glucose subsequently improved. Today, CK uptrending 4011 >4037. Transaminases improved (AST 70, ALT 90). Head CT done today showing no acute intracranial abnormality. Patient has received a total of 6 L IV fluid boluses in the past 2 days. However, CK continue's to trend up and patient is persistently tachycardic with heart rate up to 130s. She has continued to be highly agitated throughout her ED stay for the past 4 days.Trying to pull out IV lines etc. Refusing p.o.'s. Patient has even managed to get out of soft restraints and has required security assistance to get her back to her room. Required multiple rounds of antipsychotics,benzodiazepines, diphenhydramineetc. for the past 4 days.Due to CK uptrending, psychiatry recommended stopping antipsychotics/ Zyprexaas they may be causing rhabdo. Accepted by inpatient psychiatry, awaiting bed placementand medical clearance.Internal medicine teaching service wascalledand declined hospital admission. [facility] callledto admit patient formgmt of  rhabdomyolysis, persistent tachycardia, agitation/paranoia/hallucinations.  On the evening of 07/09/2019 the patient had a rapid response called due to fever and tachycardia. She was moved to a telemetry floor. Mother is at bedside today and states that the patient was awake, but not herself earlier today. EEG negative for seizure.   Neurology was consulted. MRI was negative. LP was performed under fluoro today.   Consultants   Neurology  Psychiatry  Procedures   EEG  Antibiotics   Anti-infectives (From admission, onward)   Start     Dose/Rate Route Frequency Ordered Stop   07/09/19 1900  Ampicillin-Sulbactam (UNASYN) 3 g in sodium chloride 0.9 % 100 mL IVPB     3 g 200 mL/hr over 30 Minutes Intravenous Every 8 hours 07/09/19 1845       Subjective  Today the patient is sleeping after LP. Rapid eye movements occurring.   Objective   Vitals:  Vitals:   07/13/19 1056 07/13/19 1437  BP:  133/75  Pulse: (!) 115 (!) 111  Resp:  18  Temp:  99.1 F (37.3 C)  SpO2: 99% 99%    Exam:  Constitutional: The patient is sleeping. No acute distress. Respiratory:   No increased work of breathing  No wheezes, rales, or rhonchi.  No tactile fremitus. ENT:  The patient has bitten the lateral aspect of her tongue. Cardiovascular:   Tachycardic at greater than 100 BPM.  No murmurs, ectopy or gallups.  No lateral PMI. No thrills. Abdomen:   Abdomen is soft, non-tender, non-distended.  No hernias, masses, or organomegaly  Normoactive bowel sounds. Musculoskeletal:   No cyanosis clubbing or edema. Skin:   No rashes, lesions, ulcers  palpation of skin: no induration or nodules Neurologic:   Patient is sleeping and unable to cooperate with exam. Psychiatric:  Patient is sleeping and  unable to cooperate with exam.   I have personally reviewed the following:   Today's Data   Vitals, BMP, CBC  Other Data   EEG negative.  Scheduled Meds:  folic acid  1 mg  Oral Daily   LORazepam  1 mg Intravenous Q6H   metoprolol tartrate  5 mg Intravenous Q6H   multivitamin with minerals  1 tablet Oral Daily   nicotine  21 mg Transdermal Q0600   thiamine  100 mg Oral Daily   Or   thiamine  100 mg Intravenous Daily   Continuous Infusions:  sodium chloride 150 mL/hr at 07/13/19 0912   ampicillin-sulbactam (UNASYN) IV 3 g (07/13/19 1301)    Principal Problem:   Rhabdomyolysis Active Problems:   Sinus tachycardia   Suicidal ideation   Psychosis (Veronica Arias)   LOS: 7 days   A & P  Rhabdomyolysis: Resolving slowly. I have increased her IV fluids. Monitor CK. Likely secondary to severe agitation with antipsychotic medication and possibly secondary to restraints applied for agitation. No clinical evidence of seizures but possibly pseudoseizures and obtain EEG to rule out and EEG showed a normal study within normal limits with no seizures or epileptiform discharges seen throughout the recording.  Hypernatremia/Hyperchloremia: Resolved with increased Iv fluid rate. Pt is taking no PO. Monitor. She is receiving D5 1/2 NS.  Hypokalemia: 3.8this morning. Monitor.  Abnormal LFTs: Likely related to rhabdomyolysis. Avoid hepatotoxic medications. No abdominal pain on examination. In the setting of rhabdomyolysis next-avoid hepatotoxic medications. Monitor and trend.  Persistent Sinus Tachycardia: Monitor on telemetry. Echocardiogram showed Normal EF. Metoprolol was increased, but the patient is refusing PO. She has been started on IV lopressor.Cardiology was consulted and Dr. Nechama Guard feels that her persistent rates within the 100s 120s are is likely secondary to rhabdo and agitation psych aortic issues.  There is no structural heart disease on TTE and likely the treatment will be addressing underlying cause and no further cardiac work-up is recommended at this time. TSH within normal limits.  Decreased level of consciousness/agitation:  Low grade fever yesterday.  Antipsychotics stopped. No infectious etiology has been determined. Neurology ordered MRI which was unremarkable. He plans on doing LP under fluoro tomorrow. No nuchal rigidity/meningeal signs to suggest meningitis. Atelectasis on CXR may be cause for low grade fevers.  Await results of LP/  Low grade fevers with sinus tachycardia: Infectious cause? UA negative and urine cultures are negative thus far. Blood cultures negative CXR demonstrated only atelectasis. Consider possible withdrawal from substance? UDS positive only for benzoes. Dr. Lorraine Lax from neurology is consulting. I appreciate his help.   Suicidal Ideation, Agitation/Psychosis: The patient has been evaluated by psychiatry. They have recommended stopping Depakote and continuing Ativan for now. They plan to admit to inpatient psych after medical clearance at [facility]. Suicide precautions, sitter at bedside. Continue to Monitor. Continue 1:1 sitter.   Renal insufficiency/AKI, worsening slowly: Due to rhabdomyolysis and urinary retention and patient not accepting PO. Will increase IV fluids. Avoid nephrotoxic substances and hypotension. Monitor. 1.32. Today.  Renal ultrasound is negative. Foley catheter is in place.  Hyperphosphatemia: Due to AKI and poor hydration. IV fluids increased. Monitor.  Normocytic Anemia: Hemoglobin stable for last couple of days at 11.3 and 11.0. Monitor.Likely dilutional drop in the setting of IV fluid resuscitation. Checked anemia panel and patient's iron level was 25, U IBC was 304, TIBC was 329, saturation ratios were 8%, ferritin was 66, folate level was 17.9, and vitamin B12 level was 1033. Monitor.  Obesity:  Estimated body mass index is 36.31 kg/m as calculated from the following. Once recovered the patient should seek assistance from per PCP for a sensible weight loss plan.  The patient was seen and examined by me. I have spent 32 minutes in her evaluation and care.  DVT prophylaxis: Enoxaparin 40 mg sq  Daily  Code Status: FULL CODE  Family Communication: Discussed the patient with her mother today. All questions answered to the best of my ability. Disposition Plan: Inpatient psychiatric hospitalization once she is medically stable and improved  Sherel Fennell, DO [facility] Direct contact: see [email or web address]  7PM-7AM contact night coverage as above 07/13/2019, 13:15 PM  LOS: 4 days

## 2019-07-13 NOTE — Progress Notes (Signed)
Pt with min amt clear saliva secretions able to cough and clear throat. HOB elevated 30 degrees. Scant amt of clear secretions removed with yaunkauer oral suction. O2 sats = 99% on RA. Transporter into room to take pt to IR for LP. IVF saline locked for now. Safety sitter at bedside.

## 2019-07-13 NOTE — Progress Notes (Addendum)
Reason for consult: Altered mental status  Subjective:  Patient in bed, diaphoretic, not responding to verbal stimulation. Foley was removed, and patient had to be I &O cath 3 times overnight. No acute events.   No improvement in mental status or exam this morning.  She is nonverbal and not following any commands this morning. Patient will cry out in response to pain.  Patient temp this AM 99 farenheit.   ROS: Unable to obtain due to poor mental status  Examination  Vital signs in last 24 hours: Temp:  [98.1 F (36.7 C)-99.2 F (37.3 C)] 99 F (37.2 C) (08/22 0610) Pulse Rate:  [101-128] 124 (08/22 0610) Resp:  [18-20] 20 (08/21 1740) BP: (125-155)/(70-96) 140/83 (08/22 0610) SpO2:  [98 %-100 %] 98 % (08/22 0610)  General: lying in bed,diaphoretic CVS: Tachycardic RS: breathing comfortably on RA Extremities: Diaphoretic  Neuro: MS: Somnolent, does not arouse to verbal or sternal stimuli, will moan/ cry out to noxious stimuli. does not follow commands, nonverbal CN: patient actively resist eye opening. Motor/Sensory: moved BLE spontaneously, but to command, no clonus noted. Moans to noxious in all 4 extremities.  Reflexes: 2+ bilaterally over patella, biceps,  plantars: flexor Coordination: not tested  Gait: not tested  Basic Metabolic Panel: Recent Labs  Lab 07/08/19 1008 07/09/19 0438 07/09/19 1844 07/10/19 0310 07/11/19 0441 07/12/19 0330 07/13/19 0324  NA 147* 146* 146* 145 139 139 140  K 3.1* 3.2* 3.0* 3.2* 3.0* 3.1* 3.8  CL 115* 113* 109 106 96* 99 102  CO2 22 22 24 26 28 27 22   GLUCOSE 98 119* 114* 121* 103* 85 65*  BUN 7 9 8  5* 8 11 13   CREATININE 1.25* 1.49* 1.37* 1.27* 1.44* 1.32* 1.19*  CALCIUM 8.9 9.0 9.4 8.9 8.6* 8.5* 9.0  MG 1.8 2.0 1.6* 1.8  --   --   --   PHOS 4.4 4.8* 4.9* 4.8*  --   --   --     CBC: Recent Labs  Lab 07/09/19 0438 07/09/19 1844 07/10/19 0310 07/11/19 0441 07/12/19 0330  WBC 6.8 8.1 8.7 8.8 8.3  NEUTROABS 4.3 5.1 5.9 6.0  4.9  HGB 10.4* 11.3* 11.4* 11.0* 10.6*  HCT 34.5* 36.9 36.3 35.0* 33.7*  MCV 85.4 84.1 83.4 83.7 83.2  PLT 364 416* 362 349 302     Coagulation Studies: Recent Labs    07/12/19 0752  LABPROT 13.6  INR 1.1    Varicella: pending Autoimmune encephalitis panel ( send out): pending HSV: not collected yet ANA, rheumatoid factor, Hep B, Hep C, anca, anti- DNA : are all pending  Imaging Reviewed: MRI brain w/o contrast without acute intracranial abnormalities.    ASSESSMENT AND PLAN  Veronica BaileyKyla Arias is a 28 year-old female with controlled asthma who presented to the ED with acute onset of encephalopathy and suicidal ideations and was found to have rhabdomyolysis as well as persistent tachycardia of unclear etiology. She has continued to deteriorate since admission and is now experiencing fever, diaphoresis, catatonia on exam. MRI brain without contrast was negative.  MRI brain with contrast was normal.. autoimmune workup was ordered. Will plan for an LP by IR.  autoimmune such as ANA, ANCA, RF, antiphospholipid syndrome, dsDNA.  Hepatitis B and C also ordered. As patient received multiple antipsychotics, NMS is being considered however patient has remained afebrile, CK has been downtrending and rigidity/clonus is improved with scheduled benzos.  Patient still remains catatonic on examination.  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  CSF negative for pleocytosis, elevated protein  - neuro will continue to follow  Attending Neurologist note to follow:   NEUROHOSPITALIST ADDENDUM Performed a face to face diagnostic evaluation.   I have reviewed the contents of history and physical exam as documented by PA/ARNP/Resident and agree with above documentation.  I have discussed and formulated the above plan as documented. Edits to the note have been  made as needed.  No significant commands on exam: She still is catatonic, nonverbal and does not follow any commands.  She is getting her scheduled benzos with no significant improvement.  LP negative for any infectious process, autoimmune encephalitis panel will take several weeks to return.  I am not strongly in favor of starting her on empiric IVIG as she has had a normal MRI brain with and without contrast, LP did not show any elevated protein and there is still a strong suspicion of primary psychiatric condition.  She did complain of depression 1 month ago according to her mother but never has had major depression before.  We will continue to follow this patient.    Veronica Addison Veronica Kramme MD [facility] [phone]   If 7pm to 7am, please call on call as listed on AMION.

## 2019-07-13 NOTE — Progress Notes (Signed)
Bladder scan done & obtained 750 cc.I& O with assistance of Marnisha RN under sterile technique & obtained 700cc urine output.

## 2019-07-13 NOTE — Progress Notes (Signed)
Bladder scan done again bec pt.has not voided yet & obtained 804 cc .I& O done with Roselle RN & obtained 800cc

## 2019-07-14 LAB — CBC WITH DIFFERENTIAL/PLATELET
Abs Immature Granulocytes: 0.02 10*3/uL (ref 0.00–0.07)
Basophils Absolute: 0.1 10*3/uL (ref 0.0–0.1)
Basophils Relative: 1 %
Eosinophils Absolute: 0.1 10*3/uL (ref 0.0–0.5)
Eosinophils Relative: 1 %
HCT: 33.1 % — ABNORMAL LOW (ref 36.0–46.0)
Hemoglobin: 10.5 g/dL — ABNORMAL LOW (ref 12.0–15.0)
Immature Granulocytes: 0 %
Lymphocytes Relative: 28 %
Lymphs Abs: 2.1 10*3/uL (ref 0.7–4.0)
MCH: 26.1 pg (ref 26.0–34.0)
MCHC: 31.7 g/dL (ref 30.0–36.0)
MCV: 82.3 fL (ref 80.0–100.0)
Monocytes Absolute: 1 10*3/uL (ref 0.1–1.0)
Monocytes Relative: 13 %
Neutro Abs: 4.3 10*3/uL (ref 1.7–7.7)
Neutrophils Relative %: 57 %
Platelets: 346 10*3/uL (ref 150–400)
RBC: 4.02 MIL/uL (ref 3.87–5.11)
RDW: 12.5 % (ref 11.5–15.5)
WBC: 7.6 10*3/uL (ref 4.0–10.5)
nRBC: 0 % (ref 0.0–0.2)

## 2019-07-14 LAB — ANTIPHOSPHOLIPID SYNDROME EVAL, BLD
Anticardiolipin IgA: <9 APL U/mL (ref 0–11)
Anticardiolipin IgG: <9 GPL U/mL (ref 0–14)
Anticardiolipin IgM: <9 MPL U/mL (ref 0–12)
DRVVT: 47 s (ref 0.0–47.0)
PTT Lupus Anticoagulant: 33.7 s (ref 0.0–51.9)
Phosphatydalserine, IgA: 4 APS IgA (ref 0–20)
Phosphatydalserine, IgG: 5 GPS IgG (ref 0–11)
Phosphatydalserine, IgM: 18 MPS IgM (ref 0–25)

## 2019-07-14 LAB — CULTURE, BLOOD (ROUTINE X 2)
Culture: NO GROWTH
Culture: NO GROWTH

## 2019-07-14 LAB — GLUCOSE, CAPILLARY
Glucose-Capillary: 107 mg/dL — ABNORMAL HIGH (ref 70–99)
Glucose-Capillary: 114 mg/dL — ABNORMAL HIGH (ref 70–99)
Glucose-Capillary: 97 mg/dL (ref 70–99)
Glucose-Capillary: 110 mg/dL — ABNORMAL HIGH (ref 70–99)
Glucose-Capillary: 113 mg/dL — ABNORMAL HIGH (ref 70–99)

## 2019-07-14 LAB — BASIC METABOLIC PANEL
Anion gap: 10 (ref 5–15)
BUN: 10 mg/dL (ref 6–20)
CO2: 25 mmol/L (ref 22–32)
Calcium: 8.6 mg/dL — ABNORMAL LOW (ref 8.9–10.3)
Chloride: 104 mmol/L (ref 98–111)
Creatinine, Ser: 0.89 mg/dL (ref 0.44–1.00)
GFR calc Af Amer: >60 mL/min (ref >60)
GFR calc non Af Amer: >60 mL/min (ref >60)
Glucose, Bld: 118 mg/dL — ABNORMAL HIGH (ref 70–99)
Potassium: 3.3 mmol/L — ABNORMAL LOW (ref 3.5–5.1)
Sodium: 139 mmol/L (ref 135–145)

## 2019-07-14 LAB — MPO/PR-3 (ANCA) ANTIBODIES
ANCA Proteinase 3: <3.5 U/mL (ref 0.0–3.5)
Myeloperoxidase Abs: <9 U/mL (ref 0.0–9.0)

## 2019-07-14 LAB — THYROID PEROXIDASE ANTIBODY: Thyroperoxidase Ab SerPl-aCnc: <9 IU/mL (ref 0–34)

## 2019-07-14 MED ORDER — POTASSIUM CHLORIDE 10 MEQ/100ML IV SOLN
INTRAVENOUS | Status: AC
  Filled 2019-07-14: qty 100

## 2019-07-14 MED ORDER — METOPROLOL TARTRATE 5 MG/5ML IV SOLN
7.5000 mg | Freq: Four times a day (QID) | INTRAVENOUS | Status: DC
  Administered 2019-07-14 – 2019-08-01 (×72): 7.5 mg via INTRAVENOUS
  Filled 2019-07-14 (×74): qty 10

## 2019-07-14 MED ORDER — POTASSIUM CHLORIDE 10 MEQ/100ML IV SOLN
10.0000 meq | INTRAVENOUS | Status: AC
  Administered 2019-07-14 (×4): 10 meq via INTRAVENOUS
  Filled 2019-07-14 (×3): qty 100

## 2019-07-14 NOTE — Progress Notes (Addendum)
PROGRESS NOTE  Veronica Arias YQI:[id number] DOB: 1991/05/10 DOA: 07/01/2019 PCP: Patient, No Pcp Per  Brief History   Veronica Arias a 28 y.o.femalewith medical history significant ofasthma who presented to the ED on 07/01/2019 for evaluation of suicidal ideation, paranoia, and bizarre behavior. Patient had recent ED visit 2 days ago for similar behavior. Per police report, she was found on the balcony of her apartment throwing things and saying she was suicidal and was going to jump. On initial labs, no leukocytosis, blood ethanol level negative, acetaminophen level negative, salicylate level negative.Transaminases elevated (AST 76, ALT 145), remainder of LFTs normal. Beta-hCG negative. COVID 19 rapid test negative. TSH normal. UDS positive for benzodiazepines. Labs done on 07/03/2019 showing CK 2998 and has continued to worsen since then. On 8/13, CK 4536, blood glucose 63. Blood glucose subsequently improved. Today, CK uptrending 4011 >4037. Transaminases improved (AST 70, ALT 90). Head CT done today showing no acute intracranial abnormality. Patient has received a total of 6 L IV fluid boluses in the past 2 days. However, CK continue's to trend up and patient is persistently tachycardic with heart rate up to 130s. She has continued to be highly agitated throughout her ED stay for the past 4 days.Trying to pull out IV lines etc. Refusing p.o.'s. Patient has even managed to get out of soft restraints and has required security assistance to get her back to her room. Required multiple rounds of antipsychotics,benzodiazepines, diphenhydramineetc. for the past 4 days.Due to CK uptrending, psychiatry recommended stopping antipsychotics/ Zyprexaas they may be causing rhabdo. Accepted by inpatient psychiatry, awaiting bed placementand medical clearance.Internal medicine teaching service wascalledand declined hospital admission. [facility] callledto admit patient formgmt of  rhabdomyolysis, persistent tachycardia, agitation/paranoia/hallucinations.  On the evening of 07/09/2019 the patient had a rapid response called due to fever and tachycardia. She was moved to a telemetry floor. Mother is at bedside today and states that the patient was awake, but not herself earlier today. EEG negative for seizure.   Neurology was consulted. MRI was negative. LP was performed under fluoro on 07/13/2019. It was negative for infection. Autoimmune work up is pending.  Consultants   Neurology  Psychiatry  Procedures   EEG  Antibiotics   Anti-infectives (From admission, onward)   Start     Dose/Rate Route Frequency Ordered Stop   07/09/19 1900  Ampicillin-Sulbactam (UNASYN) 3 g in sodium chloride 0.9 % 100 mL IVPB     3 g 200 mL/hr over 30 Minutes Intravenous Every 8 hours 07/09/19 1845       Subjective  Today the patient is sleeping. Not awakened.   Objective   Vitals:  Vitals:   07/14/19 0740 07/14/19 1002  BP: (!) 137/93 140/75  Pulse: (!) 107 (!) 107  Resp:    Temp: 98.1 F (36.7 C) 99 F (37.2 C)  SpO2: 97% 97%    Exam:  Constitutional: The patient is sleeping. No acute distress. Respiratory:   No increased work of breathing  No wheezes, rales, or rhonchi.  No tactile fremitus. ENT:  The patient has bitten the lateral aspect of her tongue. Cardiovascular:   Tachycardic at greater than 100 BPM.  No murmurs, ectopy or gallups.  No lateral PMI. No thrills. Abdomen:   Abdomen is soft, non-tender, non-distended.  No hernias, masses, or organomegaly  Normoactive bowel sounds. Musculoskeletal:   No cyanosis clubbing or edema. Skin:   No rashes, lesions, ulcers  palpation of skin: no induration or nodules Neurologic:   Patient is sleeping and unable  to cooperate with exam. Psychiatric:  Patient is sleeping and unable to cooperate with exam.   I have personally reviewed the following:   Today's Data   Vitals, BMP,  CBC  Other Data   EEG negative.  Scheduled Meds:  folic acid  1 mg Oral Daily   LORazepam  1 mg Intravenous Q6H   metoprolol tartrate  7.5 mg Intravenous Q6H   multivitamin with minerals  1 tablet Oral Daily   nicotine  21 mg Transdermal Q0600   thiamine  100 mg Oral Daily   Or   thiamine  100 mg Intravenous Daily   Continuous Infusions:  ampicillin-sulbactam (UNASYN) IV 3 g (07/14/19 1501)   dextrose 5 % and 0.45% NaCl 150 mL/hr at 07/14/19 [id number]    Principal Problem:   Rhabdomyolysis Active Problems:   Sinus tachycardia   Suicidal ideation   Psychosis (HCC)   LOS: 8 days   A & P  Rhabdomyolysis: Resolving slowly. I have increased her IV fluids. Monitor CK. Likely secondary to severe agitation with antipsychotic medication and possibly secondary to restraints applied for agitation. No clinical evidence of seizures but possibly pseudoseizures and obtain EEG to rule out and EEG showed a normal study within normal limits with no seizures or epileptiform discharges seen throughout the recording.  Hypernatremia/Hyperchloremia: Resolved with increased Iv fluid rate. Pt is taking no PO. Monitor. She is receiving D5 1/2 NS.  Hypokalemia: 3.3 this morning. Supplement and monitor.  Abnormal LFTs: Likely related to rhabdomyolysis. Avoid hepatotoxic medications. No abdominal pain on examination. In the setting of rhabdomyolysis next-avoid hepatotoxic medications. Monitor and trend.  Persistent Sinus Tachycardia: Monitor on telemetry. Echocardiogram showed Normal EF. Metoprolol was increased, but the patient is refusing PO. She has been started on IV lopressor.Cardiology was consulted and Dr. Gaynelle ArabianShuman feels that her persistent rates within the 100s 120s are is likely secondary to rhabdo and agitation psych aortic issues.  There is no structural heart disease on TTE and likely the treatment will be addressing underlying cause and no further cardiac work-up is recommended at this  time. TSH within normal limits.  Decreased level of consciousness/agitation:  Low grade fever yesterday. Antipsychotics stopped. No infectious etiology has been determined. Neurology ordered MRI which was unremarkable. He plans on doing LP under fluoro tomorrow. No nuchal rigidity/meningeal signs to suggest meningitis. Atelectasis on CXR may be cause for low grade fevers.  Await results of LP/neurology work up.  Low grade fevers with sinus tachycardia: Infectious cause? UA negative and urine cultures are negative thus far. Blood cultures negative CXR demonstrated only atelectasis. Consider possible withdrawal from substance? UDS positive only for benzoes. Dr. Laurence SlateAroor from neurology is consulting. I appreciate his help.   Suicidal Ideation, Agitation/Psychosis: The patient has been evaluated by psychiatry. They have recommended stopping Depakote and continuing Ativan for now. They [facility]. Suicide precautions, sitter at bedside. Continue to Monitor. Continue 1:1 sitter.   Renal insufficiency/AKI, worsening slowly: Due to rhabdomyolysis and urinary retention and patient not accepting PO. Will increase IV fluids. Avoid nephrotoxic substances and hypotension. Monitor. 1.32. Today.  Renal ultrasound is negative. Foley catheter is in place.  Hyperphosphatemia: Due to AKI and poor hydration. IV fluids increased. Monitor.  Normocytic Anemia: Hemoglobin stable for last couple of days at 11.3 and 11.0. Monitor.Likely dilutional drop in the setting of IV fluid resuscitation. Checked anemia panel and patient's iron level was 25, U IBC was 304, TIBC was 329, saturation  ratios were 8%, ferritin was 66, folate level was 17.9, and vitamin B12 level was 1033. Monitor.  Obesity:  Estimated body mass index is 36.31 kg/m as calculated from the following. Once recovered the patient should seek assistance from per PCP for a sensible weight loss plan.  The patient  was seen and examined by me. I have spent 30 minutes in her evaluation and care.  DVT prophylaxis: Enoxaparin 40 mg sq Daily  Code Status: FULL CODE  Family Communication: Discussed the patient with her mother today. All questions answered to the best of my ability. Disposition Plan: Inpatient psychiatric hospitalization once she is medically stable and improved  Veronica Finkle, DO [facility] Direct contact: see [email or web address]  7PM-7AM contact night coverage as above 07/14/2019, 5:55 PM  LOS: 4 days

## 2019-07-14 NOTE — Progress Notes (Addendum)
Reason for consult: Altered mental status  Subjective:  Patient in bed, diaphoretic, not responding to verbal stimulation.. No acute events.   No improvement in mental status or exam this morning.  She is nonverbal and not following any commands this morning. Patient will cry grimacet in response to pain.   ROS: Unable to obtain due to poor mental status  Examination  Vital signs in last 24 hours: Temp:  [98.1 F (36.7 C)-99.6 F (37.6 C)] 98.1 F (36.7 C) (08/23 0740) Pulse Rate:  [101-115] 107 (08/23 0740) Resp:  [18-28] 24 (08/23 0633) BP: (117-158)/(54-97) 137/93 (08/23 0740) SpO2:  [97 %-100 %] 97 % (08/23 0740) Weight:  [87.6 kg] 87.6 kg (08/23 16100633)  General: lying in bed,diaphoretic CVS: Tachycardic RS: breathing comfortably on RA Extremities: Diaphoretic  Neuro: MS: Somnolent, does not arouse to verbal or sternal stimuli, will grimace  to noxious stimuli. does not follow commands, nonverbal CN: patient actively resist eye opening. Motor/Sensory: moved BLE spontaneously, but to command, no clonus noted. Moans to noxious in all 4 extremities.  Reflexes: 2+ bilaterally over patella, biceps,  plantars: flexor Coordination: not tested  Gait: not tested  Basic Metabolic Panel: Recent Labs  Lab 07/08/19 1008 07/09/19 0438 07/09/19 1844 07/10/19 0310 07/11/19 0441 07/12/19 0330 07/13/19 0324 07/14/19 0434  NA 147* 146* 146* 145 139 139 140 139  K 3.1* 3.2* 3.0* 3.2* 3.0* 3.1* 3.8 3.3*  CL 115* 113* 109 106 96* 99 102 104  CO2 22 22 24 26 28 27 22 25   GLUCOSE 98 119* 114* 121* 103* 85 65* 118*  BUN 7 9 8  5* 8 11 13 10   CREATININE 1.25* 1.49* 1.37* 1.27* 1.44* 1.32* 1.19* 0.89  CALCIUM 8.9 9.0 9.4 8.9 8.6* 8.5* 9.0 8.6*  MG 1.8 2.0 1.6* 1.8  --   --   --   --   PHOS 4.4 4.8* 4.9* 4.8*  --   --   --   --     CBC: Recent Labs  Lab 07/09/19 1844 07/10/19 0310 07/11/19 0441 07/12/19 0330 07/14/19 0434  WBC 8.1 8.7 8.8 8.3 7.6  NEUTROABS 5.1 5.9 6.0 4.9 4.3   HGB 11.3* 11.4* 11.0* 10.6* 10.5*  HCT 36.9 36.3 35.0* 33.7* 33.1*  MCV 84.1 83.4 83.7 83.2 82.3  PLT 416* 362 349 302 346     Coagulation Studies: Recent Labs    07/12/19 0752  LABPROT 13.6  INR 1.1    Varicella: pending Autoimmune encephalitis panel ( send out): pending HSV: pending  rheumatoid factor, antiphospolipid syndrome, anca, : are all pending 07/04/2019 CK: 4536  8/15: 2729 8/14: VPA level 39 8/15 blood culture: no growth at 5 days 8/18 B12: 1033, iron 25, ferritin: WNL, reticulocytes: WNL CK : 947 ( trending down), lactic acid: WNL 8/19: mg: WNL,  8/20: t4 free: 1.49, T3 free: WNL, TSSH: WNL 8/21 RPR: non- reactive, anti- DNA: WNL CK : 962 (trending back up) HepB: < 3.1  Hep C: WNL , anti- smith: WNL, ANA : WNL, thyroid peroxidase: WNL  CSF 8/22:  protein WNL Glucose VDRL: pending Cell count with diff : RBC: 1 color: clear Culture: no growth < 24 hours  Imaging Reviewed: MRI brain w/o contrast without acute intracranial abnormalities.    ASSESSMENT AND PLAN  Veronica Arias is a 28 year-old female with controlled asthma who presented to the ED with acute onset of encephalopathy and suicidal ideations and was found to have rhabdomyolysis as well as persistent tachycardia of unclear etiology.  She has continued to deteriorate since admission and is now experiencing fever, diaphoresis, catatonia on exam. MRI brain without contrast was negative.  MRI brain with contrast was normal.. autoimmune workup was ordered. Marland Kitchen  autoimmune such as ANA, ANCA, RF, antiphospholipid syndrome, dsDNA.  Hepatitis B and C also ordered. As patient received multiple antipsychotics, NMS is being considered however patient has remained afebrile, CK has been downtrending and rigidity/clonus is improved (but still present) with scheduled benzos. LP was negative for any infectious process  Patient still remains catatonic on examination.  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   I have reviewed the contents of history and physical exam as documented by PA/ARNP/Resident and agree with above documentation.  I have discussed and formulated the above plan as documented. Edits to the note have been made as needed.  Patient today appears slightly worse compared to yesterday.  Today she has not followed any commands on my examination.  Autoimmune labs including anti-TPO has been negative.  Will obtain 24-hour EEG tomorrow just to make sure were not missing nonconvulsive status epilepticus.  Also recommend checking CK again to make sure this is not been trending upwards.  Otherwise, I do not think I have anything additional to offer apart from following up on autoimmune encephalitis labs.  Her catatonia appears to be refractory to benzos, and further options such as ECT should be considered in the future if there continues to be no resolution.    Karena Addison Delshon Blanchfield MD [facility] [phone]   If 7pm to 7am, please call on call as listed on AMION.

## 2019-07-15 ENCOUNTER — Inpatient Hospital Stay (HOSPITAL_COMMUNITY): Payer: BC Managed Care – PPO

## 2019-07-15 DIAGNOSIS — R401 Stupor: Secondary | ICD-10-CM

## 2019-07-15 LAB — BASIC METABOLIC PANEL
Anion gap: 8 (ref 5–15)
BUN: 6 mg/dL (ref 6–20)
CO2: 25 mmol/L (ref 22–32)
Calcium: 8.9 mg/dL (ref 8.9–10.3)
Chloride: 106 mmol/L (ref 98–111)
Creatinine, Ser: 0.96 mg/dL (ref 0.44–1.00)
GFR calc Af Amer: >60 mL/min (ref >60)
GFR calc non Af Amer: >60 mL/min (ref >60)
Glucose, Bld: 114 mg/dL — ABNORMAL HIGH (ref 70–99)
Potassium: 3.4 mmol/L — ABNORMAL LOW (ref 3.5–5.1)
Sodium: 139 mmol/L (ref 135–145)

## 2019-07-15 LAB — GLUCOSE, CAPILLARY
Glucose-Capillary: 120 mg/dL — ABNORMAL HIGH (ref 70–99)
Glucose-Capillary: 111 mg/dL — ABNORMAL HIGH (ref 70–99)
Glucose-Capillary: 120 mg/dL — ABNORMAL HIGH (ref 70–99)
Glucose-Capillary: 109 mg/dL — ABNORMAL HIGH (ref 70–99)
Glucose-Capillary: 105 mg/dL — ABNORMAL HIGH (ref 70–99)
Glucose-Capillary: 99 mg/dL (ref 70–99)

## 2019-07-15 LAB — CSF CELL COUNT WITH DIFFERENTIAL
RBC Count, CSF: 1 /mm3 — ABNORMAL HIGH
Tube #: 4
WBC, CSF: 1 /mm3 (ref 0–5)

## 2019-07-15 LAB — RHEUMATOID FACTOR: Rheumatoid fact SerPl-aCnc: <10 IU/mL (ref 0.0–13.9)

## 2019-07-15 LAB — VDRL, CSF: VDRL Quant, CSF: NONREACTIVE

## 2019-07-15 MED ORDER — ZOLPIDEM TARTRATE 5 MG PO TABS: 5.0000 mg | ORAL_TABLET | Freq: Two times a day (BID) | ORAL | Status: AC

## 2019-07-15 NOTE — Progress Notes (Signed)
Initial Nutrition Assessment  DOCUMENTATION CODES:   Obesity unspecified  INTERVENTION:  When appropriate recommend: -Initiating Osmolite 1.5 @ 20 ml/hr; advance 10 ml every 8 hrs to goal rate of  60 ml/hr -Prostat 30 ml daily via tube -MVI daily via tube  Tube feed regimen at goal rate provides 2260 kcals, 105 grams protein, 1094 ml free water  Monitor magnesium, potassium, and phosphorus daily for at least 3 days, MD to replete as needed, as pt is at risk for refeeding syndrome given pt inadequate intake since admission, current NPO status.   NUTRITION DIAGNOSIS:   Inadequate oral intake related to inability to eat as evidenced by NPO status.   GOAL:   Patient will meet greater than or equal to 90% of their needs   MONITOR:   TF tolerance, I & O's, Labs, Weight trends  REASON FOR ASSESSMENT:   Consult Enteral/tube feeding initiation and management, Assessment of nutrition requirement/status  ASSESSMENT:  RD working remotely.  28 year old female with medical history significant of asthma who presented to ED on 8/10 for evaluation of suicidal ideation, paranoia, and bizarre behavior with recent ED visit 2 days prior for similar behavior. Per police report; pt found on the balcony of her apartment throwing things, stating she was suicidal and was going to jump. UDS positive for benzodiazepines.  Patient admitted with rhabdomyolysis, persistent tachycardia, agitation/paranoia/hallucinations.  Per chart review; pt pulling at IV lines, etc and managed to get out of soft restraints; security was called for assistance to get her back to her room.   8/23 - Neurology reports patient in bed, diaphoretic, not responding to verbal stimulation; nonverbal and not following commands. Rhabdomyolysis - resolving slowly  Pt refusing po's with 0-25% meal intake since admission; pt receiving D5 1/2 NS RD consulted for assessment of nutrition requirements/status, tube feed initiation and  management. NGT not placed at this time; enteral nutrition recommendations have been provided   8/18 - rapid response for fever and tachycardia EEG - negative for seizure MRI brain w/o contrast - without acute intracranial abnormalities 8/22 - LP was performed under fluoro - negative for infection; autoimmune work up pending  Current wt - 87.6 kg (192.7 lb) Last wt 70 kg taken in 2016  I/O: -746 ml since admit UOP: 3300 ml x 24 hrs  Medications reviewed and include: ativan, thiamine, unasyn Folic acid, MVI - not given; pt now NPO  Labs: Potassium 3.4 - trending up CBGS 110-120 CK 962 - trending down   NUTRITION - FOCUSED PHYSICAL EXAM: Unable to complete at this time   Diet Order:   Diet Order            Diet NPO time specified  Diet effective now              EDUCATION NEEDS:   No education needs have been identified at this time  Skin:  Skin Assessment: Reviewed RN Assessment  Last BM:  8/20(8/23; enema; soap suds)  Height:   Ht Readings from Last 1 Encounters:  07/06/19 5\' 3"  (1.6 m)    Weight:   Wt Readings from Last 1 Encounters:  07/14/19 87.6 kg    Ideal Body Weight:  52.3 kg  BMI:  Body mass index is 34.21 kg/m.  Estimated Nutritional Needs:   Kcal:  6789-3810 (MSJ 1.25-1.4)  Protein:  100-111  Fluid:  >2L/day   Lajuan Lines, RD, LDN Office [phone] After Hours/Weekend Pager: [phone]

## 2019-07-15 NOTE — Progress Notes (Signed)
PROGRESS NOTE  Veronica Arias BJS:[id number] DOB: Sep 21, 1991 DOA: 07/01/2019 PCP: Patient, No Pcp Per  Brief History   Veronica Stimpsonis a 28 y.o.femalewith medical history significant ofasthma who presented to the ED on 07/01/2019 for evaluation of suicidal ideation, paranoia, and bizarre behavior. Patient had recent ED visit 2 days ago for similar behavior. Per police report, she was found on the balcony of her apartment throwing things and saying she was suicidal and was going to jump. On initial labs, no leukocytosis, blood ethanol level negative, acetaminophen level negative, salicylate level negative.Transaminases elevated (AST 76, ALT 145), remainder of LFTs normal. Beta-hCG negative. COVID 19 rapid test negative. TSH normal. UDS positive for benzodiazepines. Labs done on 07/03/2019 showing CK 2998 and has continued to worsen since then. On 8/13, CK 4536, blood glucose 63. Blood glucose subsequently improved. Today, CK uptrending 4011 >4037. Transaminases improved (AST 70, ALT 90). Head CT done today showing no acute intracranial abnormality. Patient has received a total of 6 L IV fluid boluses in the past 2 days. However, CK continue's to trend up and patient is persistently tachycardic with heart rate up to 130s. She has continued to be highly agitated throughout her ED stay for the past 4 days.Trying to pull out IV lines etc. Refusing p.o.'s. Patient has even managed to get out of soft restraints and has required security assistance to get her back to her room. Required multiple rounds of antipsychotics,benzodiazepines, diphenhydramineetc. for the past 4 days.Due to CK uptrending, psychiatry recommended stopping antipsychotics/ Zyprexaas they may be causing rhabdo. Accepted by inpatient psychiatry, awaiting bed placementand medical clearance.Internal medicine teaching service wascalledand declined hospital admission. [facility] callledto admit patient formgmt of  rhabdomyolysis, persistent tachycardia, agitation/paranoia/hallucinations.  On the evening of 07/09/2019 the patient had a rapid response called due to fever and tachycardia. She was moved to a telemetry floor. Mother is at bedside today and states that the patient was awake, but not herself earlier today. EEG negative for seizure.   Neurology was consulted. MRI was negative. LP was performed under fluoro on 07/13/2019. It was negative for infection. Autoimmune work up is pending, and the patient is now undergoing a long term EEG.  Consultants   Neurology  Psychiatry  Procedures   EEG  Antibiotics   Anti-infectives (From admission, onward)   Start     Dose/Rate Route Frequency Ordered Stop   07/09/19 1900  Ampicillin-Sulbactam (UNASYN) 3 g in sodium chloride 0.9 % 100 mL IVPB     3 g 200 mL/hr over 30 Minutes Intravenous Every 8 hours 07/09/19 1845       Subjective  Today the patient is sleeping. Not awakened.   Objective   Vitals:  Vitals:   07/15/19 0810 07/15/19 1232  BP:    Pulse:    Resp:    Temp:    SpO2: 99% 97%    Exam:  Constitutional: The patient is sleeping. No acute distress. Respiratory:   No increased work of breathing  No wheezes, rales, or rhonchi.  No tactile fremitus. ENT:  The patient has bitten the lateral aspect of her tongue. Cardiovascular:   Tachycardic at greater than 100 BPM.  No murmurs, ectopy or gallups.  No lateral PMI. No thrills. Abdomen:   Abdomen is soft, non-tender, non-distended.  No hernias, masses, or organomegaly  Normoactive bowel sounds. Musculoskeletal:   No cyanosis clubbing or edema. Skin:   No rashes, lesions, ulcers  palpation of skin: no induration or nodules Neurologic:   Patient is sleeping and  unable to cooperate with exam. Psychiatric:  Patient is sleeping and unable to cooperate with exam.   I have personally reviewed the following:   Today's Data   Vitals, BMP, CBC  Other Data    EEG negative.  Scheduled Meds:  folic acid  1 mg Oral Daily   LORazepam  1 mg Intravenous Q6H   metoprolol tartrate  7.5 mg Intravenous Q6H   multivitamin with minerals  1 tablet Oral Daily   nicotine  21 mg Transdermal Q0600   thiamine  100 mg Oral Daily   Or   thiamine  100 mg Intravenous Daily   zolpidem  5 mg Oral BID   Continuous Infusions:  ampicillin-sulbactam (UNASYN) IV 3 g (07/15/19 1214)   dextrose 5 % and 0.45% NaCl 150 mL/hr at 07/15/19 1438    Principal Problem:   Rhabdomyolysis Active Problems:   Sinus tachycardia   Suicidal ideation   Psychosis (HCC)   LOS: 9 days   A & P  Rhabdomyolysis: Resolving slowly. I have increased her IV fluids. Monitor CK. Likely secondary to severe agitation with antipsychotic medication and possibly secondary to restraints applied for agitation. No clinical evidence of seizures but possibly pseudoseizures and obtain EEG to rule out and EEG showed a normal study within normal limits with no seizures or epileptiform discharges seen throughout the recording.  Anorexia: Will have cortrac placed and start tube feeds in the am as per nutrition's recommendations. I appreciate their assistance.  Hypernatremia/Hyperchloremia: Resolved with increased Iv fluid rate. Pt is taking no PO. Monitor. She is receiving D5 1/2 NS. Will follow electrolytes including magnesium and phosphorus.  Hypokalemia: 3.3 this morning. Supplement and monitor.  Abnormal LFTs: Likely related to rhabdomyolysis. Avoid hepatotoxic medications. No abdominal pain on examination. In the setting of rhabdomyolysis next-avoid hepatotoxic medications. Monitor and trend.  Persistent Sinus Tachycardia: Monitor on telemetry. Echocardiogram showed Normal EF. Metoprolol was increased, but the patient is refusing PO. She has been started on IV lopressor.Cardiology was consulted and Dr. Gaynelle ArabianShuman feels that her persistent rates within the 100s 120s are is likely secondary  to rhabdo and agitation psych aortic issues.  There is no structural heart disease on TTE and likely the treatment will be addressing underlying cause and no further cardiac work-up is recommended at this time. TSH within normal limits.  Decreased level of consciousness/agitation:  Low grade fever yesterday. Antipsychotics stopped. No infectious etiology has been determined. Neurology ordered MRI which was unremarkable. He plans on doing LP under fluoro tomorrow. No nuchal rigidity/meningeal signs to suggest meningitis. Atelectasis on CXR may be cause for low grade fevers.  Await results of LP/neurology work up.  Low grade fevers with sinus tachycardia: Infectious cause? UA negative and urine cultures are negative thus far. Blood cultures negative CXR demonstrated only atelectasis. Consider possible withdrawal from substance? UDS positive only for benzoes. Dr. Laurence SlateAroor from neurology is consulting. I appreciate his help.   Suicidal Ideation, Agitation/Psychosis: The patient has been evaluated by psychiatry. They have recommended stopping Depakote and continuing Ativan for now. They plan to admit to inpatient psych [facility]. Suicide precautions, sitter at bedside. Continue to Monitor. Continue 1:1 sitter.   Renal insufficiency/AKI, worsening slowly: Due to rhabdomyolysis and urinary retention and patient not accepting PO. Will increase IV fluids. Avoid nephrotoxic substances and hypotension. Monitor. 1.32. Today.  Renal ultrasound is negative. Foley catheter is in place.  Hyperphosphatemia: Due to AKI and poor hydration. IV fluids increased. Monitor.  Normocytic Anemia: Hemoglobin  stable for last couple of days at 11.3 and 11.0. Monitor.Likely dilutional drop in the setting of IV fluid resuscitation. Checked anemia panel and patient's iron level was 25, U IBC was 304, TIBC was 329, saturation ratios were 8%, ferritin was 66, folate level was 17.9, and vitamin B12 level was 1033.  Monitor.  Obesity:  Estimated body mass index is 36.31 kg/m as calculated from the following. Once recovered the patient should seek assistance from per PCP for a sensible weight loss plan.  The patient was seen and examined by me. I have spent 32 minutes in her evaluation and care.  DVT prophylaxis: Enoxaparin 40 mg sq Daily  Code Status: FULL CODE  Family Communication: Discussed the patient with her mother today. All questions answered to the best of my ability. Disposition Plan: Inpatient psychiatric hospitalization once she is medically stable and improved  Veronica Ballweg, DO [facility] Direct contact: see [email or web address]  7PM-7AM contact night coverage as above 07/15/2019, 6:21 PM  LOS: 4 days

## 2019-07-15 NOTE — Progress Notes (Addendum)
NEUROLOGY PROGRESS NOTE  Subjective: Nonverbal, tracking practitioner in the room, follows one-step commands.  Does not talk or give any signs of discomfort  Exam: Vitals:   07/15/19 0726 07/15/19 0810  BP: 129/86   Pulse:    Resp:    Temp: 99.5 F (37.5 C)   SpO2: 100% 99%    Physical Exam   HEENT-  Normocephalic, no lesions, without obvious abnormality.  Normal external eye and conjunctiva.   Extremities- Warm, dry and intact Musculoskeletal-no joint tenderness, deformity or swelling Skin-warm and dry, no hyperpigmentation, vitiligo, or suspicious lesions    Neuro:  Mental Status: Alert, tracking practitioner in room, nonverbal, initially would follow one-step commands and then hold extremities in the air even when asked to put them down. On second exam by attending, she followed simple commands more readily Cranial Nerves: II: Blinks to threat III,IV, VI: ptosis not present, extra-ocular motions intact bilaterally pupils equal, round, reactive to light and accommodation. Stares straight forward during attending exam.  V,VII: Face symmetric.  VIII: hearing intact to some commands Motor: All extremities antigravity.  As described above, initial exam showed patient lifting both arms up when asked but then when asked to put them down she held them statically, suggestive of waxy rigidity.  This was the same with legs.  On second exam she would raise her arms and put them down to the bed when asked. Sensory: Pinprick and light touch intact throughout, bilaterally Deep Tendon Reflexes: 2+ and symmetric throughout Plantars: Mute bilaterally Cerebellar: Not performed    Medications:  Scheduled: . folic acid  1 mg Oral Daily  . LORazepam  1 mg Intravenous Q6H  . metoprolol tartrate  7.5 mg Intravenous Q6H  . multivitamin with minerals  1 tablet Oral Daily  . nicotine  21 mg Transdermal Q0600  . thiamine  100 mg Oral Daily   Or  . thiamine  100 mg Intravenous Daily     Pertinent Labs/Diagnostics: Results for CASHE, GATT (MRN [id number]) as of 07/15/2019 12:07  Ref. Range 07/13/2019 12:49  Appearance, CSF Latest Ref Range: CLEAR  CLEAR (A)  Glucose, CSF Latest Ref Range: 40 - 70 mg/dL 56  RBC Count, CSF Latest Ref Range: 0 /cu mm 1 (H)  WBC, CSF Latest Ref Range: 0 - 5 /cu mm 1  Segmented Neutrophils-CSF Latest Ref Range: 0 - 6 % TOO FEW TO COUNT, SMEAR AVAILABLE FOR REVIEW  Lymphs, CSF Latest Ref Range: 40 - 80 % TOO FEW TO COUNT, SMEAR AVAILABLE FOR REVIEW  Monocyte-Macrophage-Spinal Fluid Latest Ref Range: 15 - 45 % TOO FEW TO COUNT, SMEAR AVAILABLE FOR REVIEW  Eosinophils, CSF Latest Ref Range: 0 - 1 % TOO FEW TO COUNT, SMEAR AVAILABLE FOR REVIEW  Other Cells, CSF Unknown TOO FEW TO COUNT, SMEAR AVAILABLE FOR REVIEW  Color, CSF Latest Ref Range: COLORLESS  COLORLESS  Supernatant Unknown NOT INDICATED  Total  Protein, CSF Latest Ref Range: 15 - 45 mg/dL 15  Tube # Unknown 4   CSF culture shows no white blood cells seen HSV pending CSF cell count if above CSF VDRL pending Thyroid peroxidase within normal limits VZV by PCR pending [facility] autoimmune encephalitis panel for CSF pending Anti--Arias antibody within normal limits Anti--DNA antibody normal Antiphospholipid's normal RPR normal space-based  EEG currently running-awaiting reading   Dg Fluoro Guide Lumbar Puncture  Result Date: 07/13/2019 CLINICAL DATA:  Altered mental status EXAM: DIAGNOSTIC LUMBAR PUNCTURE UNDER FLUOROSCOPIC GUIDANCE FLUOROSCOPY TIME:  Fluoroscopy Time:  0.8 minute  Radiation Exposure Index (if provided by the fluoroscopic device): 5.3 mGy Number of Acquired Spot Images: 0 PROCEDURE: Informed consent was obtained from the patient prior to the procedure, including potential complications of headache, allergy, and pain. With the patient prone, the lower back was prepped with Betadine. 1% Lidocaine was used for local anesthesia. Lumbar puncture was performed at the  L4-5 level using a 22 gauge needle with return of clear CSF. 10 ml of CSF were obtained for laboratory studies. The patient tolerated the procedure well and there were no apparent complications. IMPRESSION: 1. Successful fluoroscopic guided lumbar puncture. Electronically Signed   By: Elige KoHetal  Patel   On: 07/13/2019 13:04   MRI head-normal MRI head with and without contrast-normal  Veronica MornDavid Smith PA-C [facility] [phone]   Assessment:  28 year-old female with catatonic state.  1. Brief review of history. She presented to the ED with acute onset of encephalopathy and suicidal ideation. She was found to have rhabdomyolysis as well as persistent tachycardia of unclear etiology. She has continued to deteriorate since admission and is now experiencing fever, diaphoresis, and catatonia on exam. MRI brain without contrast was negative.  MRI brain with contrast was normal. Autoimmune workup was ordered, including ANA, ANCA, RF, antiphospholipid antibodies and dsDNA.  Hepatitis B and C also ordered. 2. As patient received multiple antipsychotics, NMS was considered in the setting of encephalopathy with rigidity/clonus and elevated CK. She has remained afebrile, which would be atypical for NMS; however, the constellation above is suggestive of NMS. Her CK has been downtrending and rigidity/clonus is improved (but still present) with scheduled benzos.  3. LP was negative for any infectious process   4. Patient still remains catatonic on examination, but responds to several commands.   Recommendations: -Follow-up CSF autoimmune panel  -Follow-up on spot EEG -Trial of 5 mg Ambien for 4 scheduled doses (BID) has been ordered. Observe for possible improvement in her catatonia.  - Further options such as ECT should be considered in future if no resolution.  Addendum: -- EEG report conclusions: This study is within normal limits. However, only wakefulness was recorded. If suspicion for interictal  activity remains a concern, a prolonged study including sleep should be considered. No seizures or epileptiform discharges were seen throughout the recording. -- Spot EEG has been changed to LTM.   Electronically signed: Dr. Caryl PinaEric Garon Melander  07/15/2019, 12:04 PM

## 2019-07-15 NOTE — Progress Notes (Signed)
vLTM EEG started following spot EEG/ notified neuro

## 2019-07-15 NOTE — Progress Notes (Signed)
EEG completed, results pending. 

## 2019-07-15 NOTE — Procedures (Signed)
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 Electrical activity was acquired at a sampling rate of 500Hz  and reviewed with a high frequency filter of 70Hz  and a low frequency filter of 1Hz . EEG data were recorded continuously and digitally stored.   DESCRIPTION  The background consists of intermixed alpha and frontocentral predominant beta range activity.  There is a clearly defined posterior dominant rhythm that is reactive to eye opening/closure which has a frequency of 10 Hz.  Sleep is not recorded.   ABNORMALITY: 1.  None  IMPRESSION: This study is within normal limits. However, only wakefulness was recorded. If suspicion for interictal activity remains a concern, a prolonged study including sleep should be considered.   No seizures or epileptiform discharges were seen throughout the recording.  Please note that a normal EEG does not preclude the possibility of epilepsy.   Roland Rack, MD [facility] [phone]  If 7pm- 7am, please page neurology on call as listed in [place].

## 2019-07-16 LAB — GLUCOSE, CAPILLARY
Glucose-Capillary: 109 mg/dL — ABNORMAL HIGH (ref 70–99)
Glucose-Capillary: 111 mg/dL — ABNORMAL HIGH (ref 70–99)
Glucose-Capillary: 127 mg/dL — ABNORMAL HIGH (ref 70–99)
Glucose-Capillary: 82 mg/dL (ref 70–99)
Glucose-Capillary: 87 mg/dL (ref 70–99)
Glucose-Capillary: 99 mg/dL (ref 70–99)

## 2019-07-16 LAB — CSF CULTURE W GRAM STAIN
Culture: NO GROWTH
Gram Stain: NONE SEEN

## 2019-07-16 LAB — BASIC METABOLIC PANEL
Anion gap: 8 (ref 5–15)
BUN: 6 mg/dL (ref 6–20)
CO2: 24 mmol/L (ref 22–32)
Calcium: 8.7 mg/dL — ABNORMAL LOW (ref 8.9–10.3)
Chloride: 105 mmol/L (ref 98–111)
Creatinine, Ser: 0.86 mg/dL (ref 0.44–1.00)
GFR calc Af Amer: >60 mL/min (ref >60)
GFR calc non Af Amer: >60 mL/min (ref >60)
Glucose, Bld: 108 mg/dL — ABNORMAL HIGH (ref 70–99)
Potassium: 3.1 mmol/L — ABNORMAL LOW (ref 3.5–5.1)
Sodium: 137 mmol/L (ref 135–145)

## 2019-07-16 LAB — PHOSPHORUS: Phosphorus: 3.1 mg/dL (ref 2.5–4.6)

## 2019-07-16 LAB — MAGNESIUM: Magnesium: 1.7 mg/dL (ref 1.7–2.4)

## 2019-07-16 LAB — HSV DNA BY PCR (REFERENCE LAB)
HSV 1 DNA: NEGATIVE
HSV 2 DNA: NEGATIVE

## 2019-07-16 LAB — HERPES SIMPLEX VIRUS(HSV) DNA BY PCR

## 2019-07-16 MED ORDER — PRO-STAT SUGAR FREE PO LIQD: 30.0000 mL | Freq: Every day | ORAL | Status: DC

## 2019-07-16 MED ORDER — OSMOLITE 1.5 CAL PO LIQD
1000.0000 mL | ORAL | Status: DC
  Filled 2019-07-16: qty 1000

## 2019-07-16 MED ORDER — POTASSIUM CHLORIDE 10 MEQ/100ML IV SOLN
10.0000 meq | INTRAVENOUS | Status: AC
  Administered 2019-07-16 (×3): 10 meq via INTRAVENOUS
  Filled 2019-07-16 (×2): qty 100

## 2019-07-16 MED ORDER — POTASSIUM CHLORIDE 10 MEQ/100ML IV SOLN
10.0000 meq | INTRAVENOUS | Status: DC
  Administered 2019-07-16: 17:00:00 10 meq via INTRAVENOUS
  Filled 2019-07-16 (×2): qty 100

## 2019-07-16 MED ORDER — ADULT MULTIVITAMIN LIQUID CH
15.0000 mL | Freq: Every day | ORAL | Status: DC
  Filled 2019-07-16 (×2): qty 15

## 2019-07-16 NOTE — Progress Notes (Signed)
Dr. Benny Lennert spoke to patient's mother, who expressed wish to not give ativan, withhold nutrition, and not to insert Cortrak.  Ativan DC'd, NPO order remains in effect.  RN Venora Maples made aware.

## 2019-07-16 NOTE — Progress Notes (Addendum)
NEUROLOGY PROGRESS NOTE  Subjective: Patient is nonverbal  Exam: Vitals:   07/16/19 0706 07/16/19 1104  BP: 139/90 (!) 155/101  Pulse: 89 100  Resp: 18 20  Temp: (!) 97.4 F (36.3 C) (!) 97.3 F (36.3 C)  SpO2: 100% 100%    Physical Exam   HEENT-  Normocephalic, no lesions, without obvious abnormality.  Normal external eye and conjunctiva.   Extremities- Warm, dry and intact Musculoskeletal-no joint tenderness, deformity or swelling Skin-warm and dry, no hyperpigmentation, vitiligo, or suspicious lesions    Neuro:  Mental Status: Patient follows no commands nonverbal Cranial Nerves: II:  Blinks to threat bilaterally III,IV, CZ:[id number] motions intact bilaterally pupils equal, round, reactive to light and accommodation-- tracking people in room V,VII: flat affect VIII: hearing normal bilaterally  Motor: Patient will hold her arms and legs antigravity.  Arms greater than legs.  At times when doing other exam patient will move bilateral legs were arms spontaneously antigravity.  Will not grip my hands. Sensory: no response to noxious stmuli Deep Tendon Reflexes: 2+ and symmetric throughout      Medications:  Scheduled: . folic acid  1 mg Oral Daily  . LORazepam  1 mg Intravenous Q6H  . metoprolol tartrate  7.5 mg Intravenous Q6H  . multivitamin with minerals  1 tablet Oral Daily  . nicotine  21 mg Transdermal Q0600  . thiamine  100 mg Oral Daily   Or  . thiamine  100 mg Intravenous Daily  . zolpidem  5 mg Oral BID   Continuous: . ampicillin-sulbactam (UNASYN) IV 3 g (07/16/19 1130)  . dextrose 5 % and 0.45% NaCl 150 mL/hr at 07/16/19 0545    Pertinent Labs/Diagnostics: LTM preliminary reading shows no epileptiform activity.  CSF culture shows no white blood cells seen HSV negative CSF WBC normal CSF VDRL within normal limits Thyroid peroxidase within normal limits VZV by PCR pending [facility] autoimmune encephalitis panel for CSF  pending Anti--Smith antibody within normal limits Anti--DNA antibody normal Antiphospholipid's normal RPR normal    Etta Quill PA-C [facility] [phone]   Assessment: 28 year old female who presented to the ED with acute onset of encephalopathy and suicidal ideation. She was found to have rhabdomyolysis as well as persistent tachycardia of unclear etiology. She has continued to deteriorate since admission and is now experiencing fever, diaphoresis, and catatonia on exam.  Thus far all neurological tests are negative. DDx includes autoimmune encephalitis and resolving NMS.   Recommendations: -Follow-up CSF autoimmune panel  -Trial of 5 mg Ambien for 4 scheduled doses (BID) has been ordered. Observe for possible improvement in her catatonia.  - Further options such as ECT should be considered in future if no resolution. -At this time will DC LTM  Electronically signed: Dr. Kerney Elbe 07/16/2019, 11:26 AM

## 2019-07-16 NOTE — Progress Notes (Signed)
PROGRESS NOTE  Veronica Arias [id number] DOB: 09/18/1991 DOA: 07/01/2019 PCP: Patient, No Pcp Per  Brief History   Veronica Stimpsonis a 28 y.o.femalewith medical history significant ofasthma who presented to the ED on 07/01/2019 for evaluation of suicidal ideation, paranoia, and bizarre behavior. Patient had recent ED visit 2 days ago for similar behavior. Per police report, she was found on the balcony of her apartment throwing things and saying she was suicidal and was going to jump. On initial labs, no leukocytosis, blood ethanol level negative, acetaminophen level negative, salicylate level negative.Transaminases elevated (AST 76, ALT 145), remainder of LFTs normal. Beta-hCG negative. COVID 19 rapid test negative. TSH normal. UDS positive for benzodiazepines. Labs done on 07/03/2019 showing CK 2998 and has continued to worsen since then. On 8/13, CK 4536, blood glucose 63. Blood glucose subsequently improved. Today, CK uptrending 4011 >4037. Transaminases improved (AST 70, ALT 90). Head CT done today showing no acute intracranial abnormality. Patient has received a total of 6 L IV fluid boluses in the past 2 days. However, CK continue's to trend up and patient is persistently tachycardic with heart rate up to 130s. She has continued to be highly agitated throughout her ED stay for the past 4 days.Trying to pull out IV lines etc. Refusing p.o.'s. Patient has even managed to get out of soft restraints and has required security assistance to get her back to her room. Required multiple rounds of antipsychotics,benzodiazepines, diphenhydramineetc. for the past 4 days.Due to CK uptrending, psychiatry recommended stopping antipsychotics/ Zyprexaas they may be causing rhabdo. Accepted by inpatient psychiatry, awaiting bed placementand medical clearance.Internal medicine teaching service wascalledand declined hospital admission. [facility] callledto admit patient formgmt of  rhabdomyolysis, persistent tachycardia, agitation/paranoia/hallucinations.  On the evening of 07/09/2019 the patient had a rapid response called due to fever and tachycardia. She was moved to a telemetry floor. Mother is at bedside today and states that the patient was awake, but not herself earlier today. EEG negative for seizure.   Neurology was consulted. MRI was negative. LP was performed under fluoro on 07/13/2019. It was negative for infection. Autoimmune work up is pending, and the patient is now undergoing a long term EEG. Autoimmune studies on CSF is pending.   Cortrak has been ordered. Will start tube feeds as the patient is not taking PO.  Consultants   Neurology  Psychiatry  Procedures   EEG  Antibiotics   Anti-infectives (From admission, onward)   Start     Dose/Rate Route Frequency Ordered Stop   07/09/19 1900  Ampicillin-Sulbactam (UNASYN) 3 g in sodium chloride 0.9 % 100 mL IVPB     3 g 200 mL/hr over 30 Minutes Intravenous Every 8 hours 07/09/19 1845       Subjective  Today the patient is sleeping. Not awakened.   Objective   Vitals:  Vitals:   07/16/19 0706 07/16/19 1104  BP: 139/90 (!) 155/101  Pulse: 89 100  Resp: 18 20  Temp: (!) 97.4 F (36.3 C) (!) 97.3 F (36.3 C)  SpO2: 100% 100%    Exam:  Constitutional: The patient is sleeping. No acute distress. Respiratory:   No increased work of breathing  No wheezes, rales, or rhonchi.  No tactile fremitus. ENT:  The patient has bitten the lateral aspect of her tongue. Cardiovascular:   Tachycardic at greater than 100 BPM.  No murmurs, ectopy or gallups.  No lateral PMI. No thrills. Abdomen:   Abdomen is soft, non-tender, non-distended.  No hernias, masses, or organomegaly  Normoactive  bowel sounds. Musculoskeletal:   No cyanosis clubbing or edema. Skin:   No rashes, lesions, ulcers  palpation of skin: no induration or nodules Neurologic:   Patient is sleeping and unable to  cooperate with exam. Psychiatric:  Patient is sleeping and unable to cooperate with exam.  I have personally reviewed the following:   Today's Data   Vitals, BMP  Other Data   EEG negative.  Scheduled Meds:  folic acid  1 mg Oral Daily   LORazepam  1 mg Intravenous Q6H   metoprolol tartrate  7.5 mg Intravenous Q6H   multivitamin with minerals  1 tablet Oral Daily   nicotine  21 mg Transdermal Q0600   thiamine  100 mg Oral Daily   Or   thiamine  100 mg Intravenous Daily   zolpidem  5 mg Oral BID   Continuous Infusions:  ampicillin-sulbactam (UNASYN) IV 3 g (07/16/19 1130)   dextrose 5 % and 0.45% NaCl 150 mL/hr at 07/16/19 1259    Principal Problem:   Rhabdomyolysis Active Problems:   Sinus tachycardia   Suicidal ideation   Psychosis (HCC)   LOS: 10 days   A & P  Rhabdomyolysis: Resolving slowly. I have increased her IV fluids. Monitor CK. Likely secondary to severe agitation with antipsychotic medication and possibly secondary to restraints applied for agitation. No clinical evidence of seizures but possibly pseudoseizures and obtain EEG to rule out and EEG showed a normal study within normal limits with no seizures or epileptiform discharges seen throughout the recording.  Anorexia: Will have cortrac placed and start tube feeds in the am as per nutrition's recommendations. I appreciate their assistance.  Hypernatremia/Hyperchloremia: Resolved with increased Iv fluid rate. Pt is taking no PO. Monitor. She is receiving D5 1/2 NS. Will follow electrolytes including magnesium and phosphorus.  Hypokalemia: 3.1 this morning. Supplement and monitor.  Abnormal LFTs: Likely related to rhabdomyolysis. Avoid hepatotoxic medications. No abdominal pain on examination. In the setting of rhabdomyolysis next-avoid hepatotoxic medications. Monitor and trend.  Persistent Sinus Tachycardia: Monitor on telemetry. Echocardiogram showed Normal EF. Metoprolol was increased,  but the patient is refusing PO. She has been started on IV lopressor.Cardiology was consulted and Dr. Nechama Guard feels that her persistent rates within the 100s 120s are is likely secondary to rhabdo and agitation psych aortic issues.  There is no structural heart disease on TTE and likely the treatment will be addressing underlying cause and no further cardiac work-up is recommended at this time. TSH within normal limits.  Decreased level of consciousness/agitation:  Low grade fever yesterday. Antipsychotics stopped. No infectious etiology has been determined. Neurology ordered MRI which was unremarkable. He plans on doing LP under fluoro tomorrow. No nuchal rigidity/meningeal signs to suggest meningitis. Atelectasis on CXR may be cause for low grade fevers.  Await results of LP/neurology work up.  Low grade fevers with sinus tachycardia: Infectious cause? UA negative and urine cultures are negative thus far. Blood cultures negative CXR demonstrated only atelectasis. Consider possible withdrawal from substance? UDS positive only for benzoes. Dr. Lorraine Lax from neurology is consulting. I appreciate his help.   Suicidal Ideation, Agitation/Psychosis: The patient has been evaluated by psychiatry. They have recommended stopping Depakote and continuing Ativan for now. They plan to admit to inpatient psych after medical clearance at [facility]. Suicide precautions, sitter at bedside. Continue to Monitor. Continue 1:1 sitter.   Renal insufficiency/AKI, worsening slowly: Due to rhabdomyolysis and urinary retention and patient not accepting PO. Will increase IV fluids. Avoid nephrotoxic substances and  hypotension. Monitor. 1.32. Today.  Renal ultrasound is negative. Foley catheter is in place.  Hyperphosphatemia: Due to AKI and poor hydration. IV fluids increased. Monitor.  Normocytic Anemia: Hemoglobin stable for last couple of days at 11.3 and 11.0. Monitor.Likely dilutional drop in the setting of IV fluid resuscitation.  Checked anemia panel and patient's iron level was 25, U IBC was 304, TIBC was 329, saturation ratios were 8%, ferritin was 66, folate level was 17.9, and vitamin B12 level was 1033. Monitor.  Obesity:  Estimated body mass index is 36.31 kg/m as calculated from the following. Once recovered the patient should seek assistance from per PCP for a sensible weight loss plan.  The patient was seen and examined by me. I have spent 35 minutes in her evaluation and care.  DVT prophylaxis: Enoxaparin 40 mg sq Daily  Code Status: FULL CODE  Family Communication: Discussed the patient with her mother today. All questions answered to the best of my ability. Disposition Plan: Inpatient psychiatric hospitalization once she is medically stable and improved  Orena Cavazos, DO [facility] Direct contact: see [email or web address]  7PM-7AM contact night coverage as above 07/15/2019, 6:21 PM  LOS: 4 days

## 2019-07-16 NOTE — Procedures (Signed)
Patient Name: Veronica Arias  MRN: [id number]  Epilepsy Attending: Lora Havens  Referring Physician/Provider: Dr Karena Addison Aroor Duration: 07/15/2019 1131 to 07/16/2019 1145  Patient history: 28yo F with bizzare behavior and rhabdomyolysis. EEG to evaluate for seizures.  Level of alertness: awake, asleep  Technical aspects: This EEG study was done with scalp electrodes positioned according to the 10-20 International system of electrode placement. Electrical activity was acquired at a sampling rate of 500Hz  and reviewed with a high frequency filter of 70Hz  and a low frequency filter of 1Hz . EEG data were recorded continuously and digitally stored.   DESCRIPTION: The posterior dominant rhythm consists of 9-10 Hz activity of moderate voltage (25-35 uV) seen predominantly in posterior head regions, symmetric and reactive to eye opening and eye closing. There is an excessive amount of 15 to 18 Hz, 2-3 uV beta activity with irregular morphology distributed symmetrically and diffusely. Hyperventilation and photic stimulation were not performed.  IMPRESSION: This study is within normal limits. No seizures or epileptiform discharges were seen throughout the recording.   The excessive beta activity seen in the background is most likely due to the effect of benzodiazepine and is a benign EEG pattern.     Hamdi Vari [name]

## 2019-07-16 NOTE — Progress Notes (Signed)
LTM EEG discontinued - no skin breakdown at unhook.   

## 2019-07-17 DIAGNOSIS — F061 Catatonic disorder due to known physiological condition: Secondary | ICD-10-CM

## 2019-07-17 LAB — BASIC METABOLIC PANEL
Anion gap: 9 (ref 5–15)
BUN: 5 mg/dL — ABNORMAL LOW (ref 6–20)
CO2: 24 mmol/L (ref 22–32)
Calcium: 9.3 mg/dL (ref 8.9–10.3)
Chloride: 106 mmol/L (ref 98–111)
Creatinine, Ser: 0.78 mg/dL (ref 0.44–1.00)
GFR calc Af Amer: >60 mL/min (ref >60)
GFR calc non Af Amer: >60 mL/min (ref >60)
Glucose, Bld: 118 mg/dL — ABNORMAL HIGH (ref 70–99)
Potassium: 3.5 mmol/L (ref 3.5–5.1)
Sodium: 139 mmol/L (ref 135–145)

## 2019-07-17 LAB — GLUCOSE, CAPILLARY
Glucose-Capillary: 107 mg/dL — ABNORMAL HIGH (ref 70–99)
Glucose-Capillary: 109 mg/dL — ABNORMAL HIGH (ref 70–99)
Glucose-Capillary: 112 mg/dL — ABNORMAL HIGH (ref 70–99)
Glucose-Capillary: 114 mg/dL — ABNORMAL HIGH (ref 70–99)
Glucose-Capillary: 121 mg/dL — ABNORMAL HIGH (ref 70–99)
Glucose-Capillary: 132 mg/dL — ABNORMAL HIGH (ref 70–99)
Glucose-Capillary: 132 mg/dL — ABNORMAL HIGH (ref 70–99)

## 2019-07-17 LAB — HEPATIC FUNCTION PANEL
ALT: 110 U/L — ABNORMAL HIGH (ref 0–44)
AST: 81 U/L — ABNORMAL HIGH (ref 15–41)
Albumin: 3.2 g/dL — ABNORMAL LOW (ref 3.5–5.0)
Alkaline Phosphatase: 57 U/L (ref 38–126)
Bilirubin, Direct: 0.1 mg/dL (ref 0.0–0.2)
Indirect Bilirubin: 0.5 mg/dL (ref 0.3–0.9)
Total Bilirubin: 0.6 mg/dL (ref 0.3–1.2)
Total Protein: 7.4 g/dL (ref 6.5–8.1)

## 2019-07-17 LAB — CBC
HCT: 34.2 % — ABNORMAL LOW (ref 36.0–46.0)
Hemoglobin: 10.8 g/dL — ABNORMAL LOW (ref 12.0–15.0)
MCH: 26.1 pg (ref 26.0–34.0)
MCHC: 31.6 g/dL (ref 30.0–36.0)
MCV: 82.6 fL (ref 80.0–100.0)
Platelets: 498 10*3/uL — ABNORMAL HIGH (ref 150–400)
RBC: 4.14 MIL/uL (ref 3.87–5.11)
RDW: 12.3 % (ref 11.5–15.5)
WBC: 7.9 10*3/uL (ref 4.0–10.5)
nRBC: 0 % (ref 0.0–0.2)

## 2019-07-17 LAB — MISC LABCORP TEST (SEND OUT)

## 2019-07-17 LAB — SEDIMENTATION RATE: Sed Rate: 69 mm/hr — ABNORMAL HIGH (ref 0–22)

## 2019-07-17 LAB — C-REACTIVE PROTEIN: CRP: <0.8 mg/dL (ref <1.0)

## 2019-07-17 NOTE — Progress Notes (Signed)
PROGRESS NOTE  Veronica Arias KGU:[id number] DOB: 1991/08/04 DOA: 07/01/2019 PCP: Patient, No Pcp Per  Brief History   Veronica Arias a 28 y.o.femalewith medical history significant ofasthma who presented to the ED on 07/01/2019 for evaluation of suicidal ideation, paranoia, and bizarre behavior. Patient had recent ED visit 2 days ago for similar behavior. Per police report, she was found on the balcony of her apartment throwing things and saying she was suicidal and was going to jump. On initial labs, no leukocytosis, blood ethanol level negative, acetaminophen level negative, salicylate level negative.Transaminases elevated (AST 76, ALT 145), remainder of LFTs normal. Beta-hCG negative. COVID 19 rapid test negative. TSH normal. UDS positive for benzodiazepines. Labs done on 07/03/2019 showing CK 2998 and has continued to worsen since then. On 8/13, CK 4536, blood glucose 63. Blood glucose subsequently improved. Today, CK uptrending 4011 >4037. Transaminases improved (AST 70, ALT 90). Head CT done today showing no acute intracranial abnormality. Patient has received a total of 6 L IV fluid boluses in the past 2 days. However, CK continue's to trend up and patient is persistently tachycardic with heart rate up to 130s. She has continued to be highly agitated throughout her ED stay for the past 4 days.Trying to pull out IV lines etc. Refusing p.o.'s. Patient has even managed to get out of soft restraints and has required security assistance to get her back to her room. Required multiple rounds of antipsychotics,benzodiazepines, diphenhydramineetc. for the past 4 days.Due to CK uptrending, psychiatry recommended stopping antipsychotics/ Zyprexaas they may be causing rhabdo. Accepted by inpatient psychiatry, awaiting bed placementand medical clearance.Internal medicine teaching service wascalledand declined hospital admission. [facility] callledto admit patient formgmt of  rhabdomyolysis, persistent tachycardia, agitation/paranoia/hallucinations.  On the evening of 07/09/2019 the patient had a rapid response called due to fever and tachycardia. She was moved to a telemetry floor. Mother is at bedside today and states that the patient was awake, but not herself earlier today. EEG negative for seizure.   Neurology was consulted. MRI was negative. LP was performed under fluoro on 07/13/2019. It was negative for infection. Autoimmune work up is pending, and the patient is now undergoing a long term EEG. Autoimmune studies on CSF is pending.   The patient's mother has refused feeding tube placement or artificial nutrition. SLP has been asked to re-evaluate the patient. They are unable to evaluate the patient, because the patient will not cooperate with them. The patient remains NPO. On 07/16/2019 the patient's mother also stated that she did not wish for the patient to receive any more ativan.  Consultants   Neurology  Psychiatry  Procedures   EEG  Antibiotics   Anti-infectives (From admission, onward)   Start     Dose/Rate Route Frequency Ordered Stop   07/09/19 1900  Ampicillin-Sulbactam (UNASYN) 3 g in sodium chloride 0.9 % 100 mL IVPB     3 g 200 mL/hr over 30 Minutes Intravenous Every 8 hours 07/09/19 1845 07/16/19 1904     Subjective  Today the patient is awake, eyes open, but she is not tracking. Mother is at bedside.  Objective   Vitals:  Vitals:   07/17/19 1550 07/17/19 1913  BP: 115/74 (!) 135/94  Pulse: 86 91  Resp: 13 17  Temp: 97.9 F (36.6 C) 98.8 F (37.1 C)  SpO2: 97% 100%    Exam:  Constitutional: The patient is sleeping. No acute distress. Respiratory:   No increased work of breathing  No wheezes, rales, or rhonchi.  No tactile fremitus.  ENT:  The patient has bitten the lateral aspect of her tongue. Cardiovascular:   Tachycardic at greater than 100 BPM.  No murmurs, ectopy or gallups.  No lateral PMI. No  thrills. Abdomen:   Abdomen is soft, non-tender, non-distended.  No hernias, masses, or organomegaly  Normoactive bowel sounds. Musculoskeletal:   No cyanosis clubbing or edema. Skin:   No rashes, lesions, ulcers  palpation of skin: no induration or nodules Neurologic:   Patient is sleeping and unable to cooperate with exam. Psychiatric:  Patient is sleeping and unable to cooperate with exam.  I have personally reviewed the following:   Today's Data   Vitals, BMP  Other Data   EEG negative.  Scheduled Meds:  folic acid  1 mg Oral Daily   metoprolol tartrate  7.5 mg Intravenous Q6H   nicotine  21 mg Transdermal Q0600   thiamine  100 mg Oral Daily   Or   thiamine  100 mg Intravenous Daily   zolpidem  5 mg Oral BID   Continuous Infusions:  dextrose 5 % and 0.45% NaCl 150 mL/hr at 07/17/19 1800    Principal Problem:   Rhabdomyolysis Active Problems:   Sinus tachycardia   Suicidal ideation   Psychosis ([place])   LOS: 11 days   A & P  Rhabdomyolysis: Resolving slowly. I have increased her IV fluids. Monitor CK. Likely secondary to severe agitation with antipsychotic medication and possibly secondary to restraints applied for agitation. No clinical evidence of seizures but possibly pseudoseizures and obtain EEG to rule out and EEG showed a normal study within normal limits with no seizures or epileptiform discharges seen throughout the recording.  Anorexia: The patient's mother has refused feeding tube placement or artificial nutrition. SLP has been asked to re-evaluate the patient. They are unable to evaluate the patient, because the patient will not cooperate with them. The patient remains NPO.   Hypernatremia/Hyperchloremia: Resolved with increased Iv fluid rate. Pt is taking no PO. Monitor. She is receiving D5 1/2 NS. Will follow electrolytes including magnesium and phosphorus.  Hypokalemia: 3.1 this morning. Supplement and monitor.  Abnormal LFTs:  Likely related to rhabdomyolysis. Avoid hepatotoxic medications. No abdominal pain on examination. In the setting of rhabdomyolysis next-avoid hepatotoxic medications. Monitor and trend.  Persistent Sinus Tachycardia: Monitor on telemetry. Echocardiogram showed Normal EF. Metoprolol was increased, but the patient is refusing PO. She has been started on IV lopressor.Cardiology was consulted and Dr. Nechama Guard feels that her persistent rates within the 100s 120s are is likely secondary to rhabdo and agitation psych aortic issues.  There is no structural heart disease on TTE and likely the treatment will be addressing underlying cause and no further cardiac work-up is recommended at this time. TSH within normal limits.  Decreased level of consciousness/agitation:  Low grade fever yesterday. Antipsychotics stopped. No infectious etiology has been determined. Neurology ordered MRI which was unremarkable. He plans on doing LP under fluoro tomorrow. No nuchal rigidity/meningeal signs to suggest meningitis. Atelectasis on CXR may be cause for low grade fevers.  Await results of LP/neurology work up. Neurology has had the patient on ativan 1 mg qid. The patient's mother has stated that she does not want her daughter to receive this medication. I have discontinued it.  Low grade fevers with sinus tachycardia: Infectious cause? UA negative and urine cultures are negative thus far. Blood cultures negative CXR demonstrated only atelectasis. Consider possible withdrawal from substance? UDS positive only for benzoes. Dr. Lorraine Lax from neurology is consulting. I appreciate  his help.   Suicidal Ideation, Agitation/Psychosis: The patient has been evaluated by psychiatry. They have recommended stopping Depakote and continuing Ativan for now. They plan to admit to inpatient psych [facility]. Suicide precautions, sitter at bedside. Continue to Monitor. Continue 1:1 sitter.   Renal insufficiency/AKI, worsening  slowly: Due to rhabdomyolysis and urinary retention and patient not accepting PO. Will increase IV fluids. Avoid nephrotoxic substances and hypotension. Monitor. 1.32. Today.  Renal ultrasound is negative. Foley catheter is in place.  Hyperphosphatemia: Due to AKI and poor hydration. IV fluids increased. Monitor.  Normocytic Anemia: Hemoglobin stable for last couple of days at 11.3 and 11.0. Monitor.Likely dilutional drop in the setting of IV fluid resuscitation. Checked anemia panel and patient's iron level was 25, U IBC was 304, TIBC was 329, saturation ratios were 8%, ferritin was 66, folate level was 17.9, and vitamin B12 level was 1033. Monitor.  Obesity:  Estimated body mass index is 36.31 kg/m as calculated from the following. Once recovered the patient should seek assistance from per PCP for a sensible weight loss plan.  The patient was seen and examined by me. I have spent 32 minutes in her evaluation and care.  DVT prophylaxis: Enoxaparin 40 mg sq Daily  Code Status: FULL CODE  Family Communication: Discussed the patient with her mother today. All questions answered to the best of my ability. Disposition Plan: Inpatient psychiatric hospitalization once she is medically stable and improved  Veronica Reznick, DO [facility] Direct contact: see [email or web address]  7PM-7AM contact night coverage as above 07/17/2019, 7:59 PM  LOS: 4 days

## 2019-07-17 NOTE — Clinical Social Work Note (Signed)
IVC completed, pt to be served. IVC expires, 07/24/19.  [place], [place]

## 2019-07-17 NOTE — Progress Notes (Signed)
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   A/R: 1. EEG showed no seizures or epileptiform discharges. 2. Follow up CSF autoimmune panel.  3. No further recommendations.  4. Will sign off. Please call the Neurohospitalist service if Ambien is administered.    Electronically signed: Dr. Kerney Elbe

## 2019-07-17 NOTE — Evaluation (Signed)
Clinical/Bedside Swallow Evaluation Patient Details  Name: Veronica Arias MRN: [id number] Date of Birth: June 17, 1991  Today's Date: 07/17/2019 Time: SLP Start Time (ACUTE ONLY): 2585 SLP Stop Time (ACUTE ONLY): 1251 SLP Time Calculation (min) (ACUTE ONLY): 16 min  Past Medical History:  Past Medical History:  Diagnosis Date  . Asthma    Past Surgical History:  Past Surgical History:  Procedure Laterality Date  . NO PAST SURGERIES     HPI:  28 year old female who presented to the ED on 8/10 with acute onset of encephalopathy and suicidal ideation. She was found to have rhabdomyolysis as well as persistent tachycardia of unclear etiology. She has continued to deteriorate since admission, experiencing fever, diaphoresis, and catatonia.  Thus far all neurological tests are negative. DDx includes autoimmune encephalitis and resolving NMS. Pt has been evaluated by psychiatry; they plan to admit to inpt psych after medical improvements.    Assessment / Plan / Recommendation Clinical Impression  Orders received for clinical swallow assessment.  Mother and sitter present.  Pt alert, sitting upright in bed, unable to follow any commands and did not verbalize.  Spontaneous smiling and laughter were noted, but no volitional speech.  Oral mechanism exam unremarkable.  Pt with saliva collecting in lateral sulci.  She raised and lowered eyebrows in reaction to conversation around her; mouth in open posture. When presented with ice chip/1/2 teaspoon water, material pooled behind lower lip and spilled anteriorly.  Pt unable to achieve lip seal, did not initiate tongue movement nor demonstrate any spontaneous swallow response.  D/W pt's mother, Hilda Blades, at bedside.  Continue NPO given catatonia; SLP will follow for readiness/ diet advancement.   SLP Visit Diagnosis: Dysphagia, unspecified (R13.10)    Aspiration Risk       Diet Recommendation   NPO for now; but allow POs if pt's MS improves       Other   Recommendations     Follow up Recommendations        Frequency and Duration min 2x/week  2 weeks       Prognosis Prognosis for Safe Diet Advancement: Good      Swallow Study   General Date of Onset: 07/01/19 HPI: 28 year old female who presented to the ED on 8/10 with acute onset of encephalopathy and suicidal ideation. She was found to have rhabdomyolysis as well as persistent tachycardia of unclear etiology. She has continued to deteriorate since admission, experiencing fever, diaphoresis, and catatonia.  Thus far all neurological tests are negative. DDx includes autoimmune encephalitis and resolving NMS. Pt has been evaluated by psychiatry; they plan to admit to inpt psych after medically stable.  Type of Study: Bedside Swallow Evaluation Diet Prior to this Study: NPO Temperature Spikes Noted: No Respiratory Status: Room air History of Recent Intubation: No Behavior/Cognition: Alert Oral Cavity Assessment: Within Functional Limits Oral Cavity - Dentition: Adequate natural dentition Patient Positioning: Upright in bed Baseline Vocal Quality: Normal Volitional Cough: Cognitively unable to elicit Volitional Swallow: Unable to elicit    Oral/Motor/Sensory Function Overall Oral Motor/Sensory Function: Other (comment)(symmetric at rest, unable to follow commands)   Ice Chips Ice chips: Impaired Presentation: Spoon Oral Phase Impairments: Reduced labial seal;Reduced lingual movement/coordination Oral Phase Functional Implications: Right anterior spillage;Left anterior spillage;Oral holding Pharyngeal Phase Impairments: Other (comments)(no swallow)   Thin Liquid Thin Liquid: Impaired Presentation: Spoon Oral Phase Impairments: Reduced labial seal Oral Phase Functional Implications: Right anterior spillage;Left anterior spillage;Oral holding Pharyngeal  Phase Impairments: Other (comments)(no swallow response)  Nectar Thick Nectar Thick Liquid: Not tested   Honey Thick Honey  Thick Liquid: Not tested   Puree Puree: Not tested   Solid     Solid: Not tested      Blenda Mountsouture, Arshawn Valdez Laurice 07/17/2019,1:37 PM   Marchelle FolksAmanda L. Samson Fredericouture, MA CCC/SLP Acute Rehabilitation Services Office number [phone] Pager [phone]

## 2019-07-17 NOTE — Care Management (Signed)
07-17-19 1514 Transitions of Care Lead Coordinator to arrange meeting for MD, Neurologist, Mother and Psych to have a family meeting. Bethena Roys, RN, BSN Case Manager [phone]

## 2019-07-17 NOTE — Clinical Social Work Note (Signed)
Transitions of Care Advanced Care Supervisor contacted Psychiatry and Neurology regarding request for family meeting per attending MD.  Per Psychiatrist, patient has not yet had full evaluation by Psychiatry and will need new Psychiatry order to be seen - MD does not feel comfortable weighing in on family discussion without assessing patient first.  Psychiatrist is agreeable to have attending MD call directly to further discuss.  According to Neurologist, family meeting needs to be had by attending MD and he will be available by phone to conference in during the family meeting if clarity is needed for resolution.  Advanced Care Supervisor paged attending MD to update.  Unit RNCM aware.  Barbette Or, [place] Supervisor [phone]

## 2019-07-18 LAB — GLUCOSE, CAPILLARY
Glucose-Capillary: 125 mg/dL — ABNORMAL HIGH (ref 70–99)
Glucose-Capillary: 117 mg/dL — ABNORMAL HIGH (ref 70–99)
Glucose-Capillary: 132 mg/dL — ABNORMAL HIGH (ref 70–99)
Glucose-Capillary: 120 mg/dL — ABNORMAL HIGH (ref 70–99)

## 2019-07-18 LAB — ANAEROBIC CULTURE

## 2019-07-18 MED ORDER — ENOXAPARIN SODIUM 40 MG/0.4ML ~~LOC~~ SOLN
40.0000 mg | SUBCUTANEOUS | Status: DC
  Administered 2019-07-18 – 2019-08-05 (×16): 40 mg via SUBCUTANEOUS
  Filled 2019-07-18 (×17): qty 0.4

## 2019-07-18 MED ORDER — MEGESTROL ACETATE 400 MG/10ML PO SUSP
400.0000 mg | Freq: Every day | ORAL | Status: DC
  Filled 2019-07-18 (×9): qty 10

## 2019-07-18 MED ORDER — METOPROLOL TARTRATE 5 MG/5ML IV SOLN
7.5000 mg | Freq: Once | INTRAVENOUS | Status: AC
  Administered 2019-07-18: 22:00:00 7.5 mg via INTRAVENOUS

## 2019-07-18 NOTE — Progress Notes (Signed)
  Speech Language Pathology Treatment: Dysphagia  Patient Details Name: Veronica Arias MRN: [id number] DOB: 1991-09-23 Today's Date: 07/18/2019 Time: 8341-9622 SLP Time Calculation (min) (ACUTE ONLY): 10 min  Assessment / Plan / Recommendation Clinical Impression  Pt continues to have minimal engagement in PO trials. She keeps her eyes closed, talks nonsensically. Saliva pools in her left buccal cavity and spills anteriorly from the left side of her mouth (head is positioned with a mild tilt toward this side). Yankauer was used to remove remaining secretions. She demonstrates no initiation for bolus acceptance or manipulation. Continue to recommend that she remain NPO pending improvements in mentation.   HPI HPI: 28 year old female who presented to the ED on 8/10 with acute onset of encephalopathy and suicidal ideation. She was found to have rhabdomyolysis as well as persistent tachycardia of unclear etiology. She has continued to deteriorate since admission, experiencing fever, diaphoresis, and catatonia.  Thus far all neurological tests are negative. DDx includes autoimmune encephalitis and resolving NMS. Pt has been evaluated by psychiatry; they plan to admit to inpt psych after medically stable.       SLP Plan  Continue with current plan of care       Recommendations  Diet recommendations: NPO(pending improvements in alertness) Medication Administration: Via alternative means                Oral Care Recommendations: Oral care QID Follow up Recommendations: (tba) SLP Visit Diagnosis: Dysphagia, unspecified (R13.10) Plan: Continue with current plan of care       GO                Veronica Arias 07/18/2019, 9:54 AM  Pollyann Glen, M.A. [place] Acute Environmental education officer [phone] Office [phone]

## 2019-07-18 NOTE — Progress Notes (Addendum)
PROGRESS NOTE  Veronica BaileyKyla Arias ZOX:[id number] DOB: 11-09-1991 DOA: 07/01/2019 PCP: Patient, No Pcp Per  Brief History   Veronica Stimpsonis a 28 y.o.femalewith medical history significant ofasthma who presented to the ED on 07/01/2019 for evaluation of suicidal ideation, paranoia, and bizarre behavior. Patient had recent ED visit 2 days ago for similar behavior. Per police report, she was found on the balcony of her apartment throwing things and saying she was suicidal and was going to jump. On initial labs, no leukocytosis, blood ethanol level negative, acetaminophen level negative, salicylate level negative.Transaminases elevated (AST 76, ALT 145), remainder of LFTs normal. Beta-hCG negative. COVID 19 rapid test negative. TSH normal. UDS positive for benzodiazepines. Labs done on 07/03/2019 showing CK 2998 and has continued to worsen since then. On 8/13, CK 4536, blood glucose 63. Blood glucose subsequently improved. Today, CK uptrending 4011 >4037. Transaminases improved (AST 70, ALT 90). Head CT done today showing no acute intracranial abnormality. Patient has received a total of 6 L IV fluid boluses in the past 2 days. However, CK continue's to trend up and patient is persistently tachycardic with heart rate up to 130s. She has continued to be highly agitated throughout her ED stay for the past 4 days.Trying to pull out IV lines etc. Refusing p.o.'s. Patient has even managed to get out of soft restraints and has required security assistance to get her back to her room. Required multiple rounds of antipsychotics,benzodiazepines, diphenhydramineetc. for the past 4 days.Due to CK uptrending, psychiatry recommended stopping antipsychotics/ Zyprexaas they may be causing rhabdo. Accepted by inpatient psychiatry, awaiting bed placementand medical clearance.Internal medicine teaching service wascalledand declined hospital admission. [facility] callledto admit patient formgmt of  rhabdomyolysis, persistent tachycardia, agitation/paranoia/hallucinations.  On the evening of 07/09/2019 the patient had a rapid response called due to fever and tachycardia. She was moved to a telemetry floor. Mother is at bedside today and states that the patient was awake, but not herself earlier today. EEG negative for seizure.   Neurology was consulted. MRI was negative. LP was performed under fluoro on 07/13/2019. It was negative for infection. Autoimmune work up is pending, and the patient is now undergoing a long term EEG. Autoimmune studies on CSF is pending.   The patient's mother has refused feeding tube placement or artificial nutrition. SLP has been asked to re-evaluate the patient. They are unable to evaluate the patient, because the patient will not cooperate with them. The patient remains NPO. On 07/16/2019 the patient's mother also stated that she did not wish for the patient to receive any more ativan.  Consultants   Neurology  Psychiatry  Procedures   EEG  Antibiotics   Anti-infectives (From admission, onward)   Start     Dose/Rate Route Frequency Ordered Stop   07/09/19 1900  Ampicillin-Sulbactam (UNASYN) 3 g in sodium chloride 0.9 % 100 mL IVPB     3 g 200 mL/hr over 30 Minutes Intravenous Every 8 hours 07/09/19 1845 07/16/19 1904     Subjective  Today the patient is awake with eyes transiently open. She may have made eye contact with me.  Objective   Vitals:  Vitals:   07/18/19 1135 07/18/19 1554  BP: 112/62   Pulse: (!) 106   Resp: 16   Temp: 99.1 F (37.3 C) 99 F (37.2 C)  SpO2:      Exam:  Constitutional: The patient is lying with her eyes closed, but will open them with stimulation. She is making brief eye contact with me. She remains  non-verbal. Respiratory:   No increased work of breathing  No wheezes, rales, or rhonchi.  No tactile fremitus. ENT:  The patient has bitten the lateral aspect of her tongue. Cardiovascular:   Tachycardic  at greater than 100 BPM.  No murmurs, ectopy or gallups.  No lateral PMI. No thrills. Abdomen:   Abdomen is soft, non-tender, non-distended.  No hernias, masses, or organomegaly  Normoactive bowel sounds. Musculoskeletal:   No cyanosis clubbing or edema. Skin:   No rashes, lesions, ulcers  palpation of skin: no induration or nodules Neurologic:   Patient is unable to cooperate with exam. Psychiatric:  Patient isunable to cooperate with exam.  I have personally reviewed the following:   Today's Data   Vitals, BMP  Other Data   EEG negative.  Scheduled Meds:  enoxaparin (LOVENOX) injection  40 mg Subcutaneous Q24H   folic acid  1 mg Oral Daily   megestrol  400 mg Oral Daily   metoprolol tartrate  7.5 mg Intravenous Q6H   nicotine  21 mg Transdermal Q0600   thiamine  100 mg Oral Daily   Or   thiamine  100 mg Intravenous Daily   Continuous Infusions:  dextrose 5 % and 0.45% NaCl 150 mL/hr at 07/18/19 0710    Principal Problem:   Altered mental status Active Problems:   Rhabdomyolysis   Sinus tachycardia   Suicidal ideation   Psychosis (HCC)   LOS: 12 days   A & P  Rhabdomyolysis: Resolving slowly. I have increased her IV fluids. Monitor CK. Likely secondary to severe agitation with antipsychotic medication and possibly secondary to restraints applied for agitation. No clinical evidence of seizures but possibly pseudoseizures and obtain EEG to rule out and EEG showed a normal study within normal limits with no seizures or epileptiform discharges seen throughout the recording.  Anorexia: The patient's mother has refused feeding tube placement or artificial nutrition. SLP has been asked to re-evaluate the patient. They are unable to evaluate the patient, because the patient will not cooperate with them. The patient remains NPO. I have ordered megace at the mother's request, but I doubt that the patient will take it.  Hypernatremia/Hyperchloremia:  Resolved with increased Iv fluid rate. Pt is taking no PO. Monitor. She is receiving D5 1/2 NS. Will follow electrolytes including magnesium and phosphorus.  Hypokalemia: 3.1 this morning. Supplement and monitor.  Abnormal LFTs: Likely related to rhabdomyolysis. Avoid hepatotoxic medications. No abdominal pain on examination. In the setting of rhabdomyolysis next-avoid hepatotoxic medications. Monitor and trend.  Persistent Sinus Tachycardia: Monitor on telemetry. Echocardiogram showed Normal EF. Metoprolol was increased, but the patient is refusing PO. She has been started on IV lopressor.Cardiology was consulted and Dr. Gaynelle Arabian feels that her persistent rates within the 100s 120s are is likely secondary to rhabdo and agitation psych aortic issues.  There is no structural heart disease on TTE and likely the treatment will be addressing underlying cause and no further cardiac work-up is recommended at this time. TSH within normal limits.  Decreased level of consciousness/agitation:  Low grade fever yesterday. Antipsychotics stopped. No infectious etiology has been determined. Neurology ordered MRI which was unremarkable. He plans on doing LP under fluoro tomorrow. No nuchal rigidity/meningeal signs to suggest meningitis. Atelectasis on CXR may be cause for low grade fevers.  Await results of LP/neurology work up. Neurology has had the patient on ativan 1 mg qid. The patient's mother has stated that she does not want her daughter to receive this medication. I have  discontinued it. I have discussed the patient with Dr. Mariea Clonts. She will  Evaluate the patient. I appreciate her assistance.  Low grade fevers with sinus tachycardia: Infectious cause? UA negative and urine cultures are negative thus far. Blood cultures negative CXR demonstrated only atelectasis. Consider possible withdrawal from substance? UDS positive only for benzoes. Dr. Lorraine Lax from neurology is consulting. I appreciate his help.   Suicidal  Ideation, Agitation/Psychosis: The patient has been evaluated by psychiatry. They have recommended stopping Depakote and continuing Ativan for now. They plan to admit to inpatient psych after medical clearance at [facility]. Suicide precautions, sitter at bedside. Continue to Monitor. Continue 1:1 sitter.   Renal insufficiency/AKI, worsening slowly: Due to rhabdomyolysis and urinary retention and patient not accepting PO. Will increase IV fluids. Avoid nephrotoxic substances and hypotension. Monitor. 1.32. Today.  Renal ultrasound is negative. Foley catheter is in place.  Hyperphosphatemia: Due to AKI and poor hydration. IV fluids increased. Monitor.  Normocytic Anemia: Hemoglobin stable for last couple of days at 11.3 and 11.0. Monitor.Likely dilutional drop in the setting of IV fluid resuscitation. Checked anemia panel and patient's iron level was 25, U IBC was 304, TIBC was 329, saturation ratios were 8%, ferritin was 66, folate level was 17.9, and vitamin B12 level was 1033. Monitor.  Obesity:  Estimated body mass index is 36.31 kg/m as calculated from the following. Once recovered the patient should seek assistance from per PCP for a sensible weight loss plan.  The patient was seen and examined by me. I have spent 45 minutes in her evaluation and care.  DVT prophylaxis: Enoxaparin 40 mg sq Daily  Code Status: FULL CODE  Family Communication: Discussed the patient with her mother today. All questions answered to the best of my ability. Tentative plan for a meeting with the patient's mother and father tomorrow afternoon after lunch. Disposition Plan: Inpatient psychiatry vs placement in rehab facility.   Veronica Hahne, DO [facility] Direct contact: see [email or web address]  7PM-7AM contact night coverage as above 07/18/2019, 2:42 PM  LOS: 4 days

## 2019-07-18 NOTE — TOC Initial Note (Signed)
Transition of Care [facility]) - Initial/Assessment Note    Patient Details  Name: Veronica Arias MRN: [id number] Date of Birth: [birth year]  Transition of Care [facility]) CM/SW Contact:    Bethena Roys, RN Phone Number: 07/18/2019, 1:37 PM  Clinical Narrative:  Pt Presented for management of Rhabdomyolysis. PTA from home alone with the support of her mother. CM received referral for patient to transition to the [facility] [place] did call [name] Case Manager at the [facility] @ [phone] and the patient has to have 2 therapies disciplines PT/OT for the referral. CM did page MD to order PT/OT. After the patient has been seen from the above disciplines CM will fax information to [place] at the [facility].                  Expected Discharge Plan: [place] Barriers to Discharge: Continued Medical Work up(Referral sent to [facility])   Expected Discharge Plan and Services Expected Discharge Plan: [place]   Discharge Planning Services: CM Consult Post Acute Care Choice: NA Living arrangements for the past 2 months: Apartment                   Prior Living Arrangements/Services Living arrangements for the past 2 months: Apartment Lives with:: Self          Need for Family Participation in Patient Care: Yes (Comment) Care giver support system in place?: Yes (comment)   Criminal Activity/Legal Involvement Pertinent to Current Situation/Hospitalization: No - Comment as needed  Activities of Daily Living Home Assistive Devices/Equipment: None, Eyeglasses ADL Screening (condition at time of admission) Patient's cognitive ability adequate to safely complete daily activities?: Yes Is the patient deaf or have difficulty hearing?: No Does the patient have difficulty seeing, even when wearing glasses/contacts?: No Does the patient have difficulty concentrating, remembering, or making decisions?: Yes Patient able to express  need for assistance with ADLs?: Yes Does the patient have difficulty dressing or bathing?: No Independently performs ADLs?: Yes (appropriate for developmental age) Does the patient have difficulty walking or climbing stairs?: Yes Weakness of Legs: Both Weakness of Arms/Hands: None  Permission Sought/Granted Permission sought to share information with : Facility Sport and exercise psychologist, Family Supports       Permission granted to share info w AGENCY: [place]        Emotional Assessment Appearance:: Appears stated age Attitude/Demeanor/Rapport: Unable to Assess Affect (typically observed): Unable to Assess   Alcohol / Substance Use: Not Applicable Psych Involvement: No (comment)  Admission diagnosis:  Mania ([place]) [F30.9] Tachycardia [R00.0] Non-traumatic rhabdomyolysis [M62.82] Suicidal behavior without attempted self-injury [R46.89] Patient Active Problem List   Diagnosis Date Noted  . Sinus tachycardia 07/06/2019  . Suicidal ideation 07/06/2019  . Psychosis ([facility]) 07/06/2019  . Rhabdomyolysis 07/05/2019  . Major depressive disorder, recurrent episode ([place]) 07/04/2019  . Acute stress disorder 06/29/2019  . Adjustment disorder with anxiety 06/29/2019  . Bizarre behavior   . Palpitations 12/06/2013  . Chest pain, atypical 12/06/2013  . Heart murmur 12/06/2013   PCP:  Patient, No Pcp Per Pharmacy:   [id number] [place], [address] [place] [place] [address] Phone: [phone] Fax: [phone]   Social Determinants of Health (SDOH) Interventions    Readmission Risk Interventions No flowsheet data found.

## 2019-07-19 LAB — GLUCOSE, CAPILLARY
Glucose-Capillary: 101 mg/dL — ABNORMAL HIGH (ref 70–99)
Glucose-Capillary: 102 mg/dL — ABNORMAL HIGH (ref 70–99)
Glucose-Capillary: 107 mg/dL — ABNORMAL HIGH (ref 70–99)
Glucose-Capillary: 108 mg/dL — ABNORMAL HIGH (ref 70–99)
Glucose-Capillary: 113 mg/dL — ABNORMAL HIGH (ref 70–99)
Glucose-Capillary: 117 mg/dL — ABNORMAL HIGH (ref 70–99)
Glucose-Capillary: 122 mg/dL — ABNORMAL HIGH (ref 70–99)

## 2019-07-19 MED ORDER — LORAZEPAM 2 MG/ML IJ SOLN
0.5000 mg | Freq: Four times a day (QID) | INTRAMUSCULAR | Status: DC | PRN
  Administered 2019-07-19 – 2019-07-24 (×6): 0.5 mg via INTRAVENOUS
  Filled 2019-07-19 (×6): qty 1

## 2019-07-19 MED ORDER — LORAZEPAM 0.5 MG PO TABS: 0.5000 mg | ORAL_TABLET | Freq: Four times a day (QID) | ORAL | Status: DC | PRN

## 2019-07-19 NOTE — Progress Notes (Signed)
  Speech Language Pathology Treatment: Dysphagia  Patient Details Name: Veronica Arias MRN: [id number] DOB: [birth year] Today's Date: 07/19/2019 Time: 3614-4315 SLP Time Calculation (min) (ACUTE ONLY): 12 min  Assessment / Plan / Recommendation Clinical Impression  Pt kept her eyes open throughout session today, interacting primarily with facial expressions but not verbalizing or providing functional communication. She has saliva that spills out of her mouth from the left, and thin liquids siphoned via straw result in the same. She has no oral opening with purees or thin liquids. Pt's mother tried to act as a therapeutic agent and provide trials herself, without significant changes noted. Education was provided about hopefully intact swallowing mechanism that is impacted presently by mentation. Continue to recommend NPO pending improved initiation.   HPI HPI: 28 year old female who presented to the ED on 8/10 with acute onset of encephalopathy and suicidal ideation. She was found to have rhabdomyolysis as well as persistent tachycardia of unclear etiology. She has continued to deteriorate since admission, experiencing fever, diaphoresis, and catatonia.  Thus far all neurological tests are negative. DDx includes autoimmune encephalitis and resolving NMS. Pt has been evaluated by psychiatry; they plan to admit to inpt psych after medically stable.       SLP Plan  Continue with current plan of care       Recommendations  Diet recommendations: NPO(pending improvements in initiation) Medication Administration: Via alternative means                Oral Care Recommendations: Oral care QID Follow up Recommendations: Inpatient Rehab SLP Visit Diagnosis: Dysphagia, unspecified (R13.10) Plan: Continue with current plan of care       GO                Venita Sheffield Colman [place] 07/19/2019, 2:29 PM  Pollyann Glen, M.A. [place] Acute Environmental education officer [phone] Office [phone]

## 2019-07-19 NOTE — TOC Progression Note (Signed)
Transition of Care [facility]) - Progression Note    Patient Details  Name: Veronica Arias MRN: [id number] Date of Birth: Sep 22, 1991  [facility] Care [facility]) CM/SW Contact  Graves-Bigelow, Ocie Cornfield, RN Phone Number: 07/19/2019, 2:06 PM  Clinical Narrative:  Information faxed to the [place] did call [name] to make her aware of above information. Awaiting to hear back to see if patient is appropriate to transition to facility. CM will continue to monitor.    Expected Discharge Plan: IP Rehab Facility Barriers to Discharge: Continued Medical Work up(Referral sent to [facility])  Expected Discharge Plan and Services Expected Discharge Plan: [place]   Discharge Planning Services: CM Consult Post Acute Care Choice: NA Living arrangements for the past 2 months: Apartment                   Social Determinants of Health (SDOH) Interventions    Readmission Risk Interventions No flowsheet data found.

## 2019-07-19 NOTE — Progress Notes (Signed)
PROGRESS NOTE  Veronica Arias ERD:[id number] DOB: 1991-01-11 DOA: 07/01/2019 PCP: Patient, No Pcp Per  Brief History   Veronica Stimpsonis a 27 y.o.femalewith medical history significant ofasthma who presented to the ED on 07/01/2019 for evaluation of suicidal ideation, paranoia, and bizarre behavior. Patient had recent ED visit 2 days ago for similar behavior. Per police report, she was found on the balcony of her apartment throwing things and saying she was suicidal and was going to jump. On initial labs, no leukocytosis, blood ethanol level negative, acetaminophen level negative, salicylate level negative.Transaminases elevated (AST 76, ALT 145), remainder of LFTs normal. Beta-hCG negative. COVID 19 rapid test negative. TSH normal. UDS positive for benzodiazepines. Labs done on 07/03/2019 showing CK 2998 and has continued to worsen since then. On 8/13, CK 4536, blood glucose 63. Blood glucose subsequently improved. Today, CK uptrending 4011 >4037. Transaminases improved (AST 70, ALT 90). Head CT done today showing no acute intracranial abnormality. Patient has received a total of 6 L IV fluid boluses in the past 2 days. However, CK continue's to trend up and patient is persistently tachycardic with heart rate up to 130s. She has continued to be highly agitated throughout her ED stay for the past 4 days.Trying to pull out IV lines etc. Refusing p.o.'s. Patient has even managed to get out of soft restraints and has required security assistance to get her back to her room. Required multiple rounds of antipsychotics,benzodiazepines, diphenhydramineetc. for the past 4 days.Due to CK uptrending, psychiatry recommended stopping antipsychotics/ Zyprexaas they may be causing rhabdo. Accepted by inpatient psychiatry, awaiting bed placementand medical clearance.Internal medicine teaching service wascalledand declined hospital admission. [facility] callledto admit patient formgmt of  rhabdomyolysis, persistent tachycardia, agitation/paranoia/hallucinations.  On the evening of 07/09/2019 the patient had a rapid response called due to fever and tachycardia. She was moved to a telemetry floor. Mother is at bedside today and states that the patient was awake, but not herself earlier today. EEG negative for seizure.   Neurology was consulted. MRI was negative. LP was performed under fluoro on 07/13/2019. It was negative for infection. Autoimmune work up is pending, and the patient is now undergoing a long term EEG. Autoimmune studies on CSF is pending.   The patient's mother has refused feeding tube placement or artificial nutrition. SLP has been asked to re-evaluate the patient. They are unable to evaluate the patient, because the patient will not cooperate with them. The patient remains NPO. On 07/16/2019 the patient's mother also stated that she did not wish for the patient to receive any more ativan. The patient's mother wishes for the [facility] to [facility] [place]. She is being evaluated for this.  Consultants   Neurology  Psychiatry  Procedures   EEG  Antibiotics   Anti-infectives (From admission, onward)   Start     Dose/Rate Route Frequency Ordered Stop   07/09/19 1900  Ampicillin-Sulbactam (UNASYN) 3 g in sodium chloride 0.9 % 100 mL IVPB     3 g 200 mL/hr over 30 Minutes Intravenous Every 8 hours 07/09/19 1845 07/16/19 1904     Subjective  Today the patient is awake with eyes transiently open. She is making eye contact. She is speaking with the mother according to the mother's report. She is non-verbal for me. She is still not eating.  Objective   Vitals:  Vitals:   07/19/19 1322 07/19/19 1657  BP: 124/76 136/81  Pulse:  (!) 116  Resp:  20  Temp:  99.4  F (37.4 C)  SpO2:  100%    Exam:  Constitutional: The patient is lying with her eyes open this morning.n. She is makingeye contact with me. She remains  non-verbal. Respiratory:   No increased work of breathing  No wheezes, rales, or rhonchi.  No tactile fremitus.  Cardiovascular:   Tachycardic at greater than 100 BPM.  No murmurs, ectopy or gallups.  No lateral PMI. No thrills. Abdomen:   Abdomen is soft, non-tender, non-distended.  No hernias, masses, or organomegaly  Normoactive bowel sounds. Musculoskeletal:   No cyanosis clubbing or edema. Skin:   No rashes, lesions, ulcers  palpation of skin: no induration or nodules Neurologic:   Patient is unable to cooperate with exam. Psychiatric:  Patient isunable to cooperate with exam.  I have personally reviewed the following:   Today's Data   Vitals, BMP  Other Data   EEG negative.  Scheduled Meds:  enoxaparin (LOVENOX) injection  40 mg Subcutaneous O96E   folic acid  1 mg Oral Daily   megestrol  400 mg Oral Daily   metoprolol tartrate  7.5 mg Intravenous Q6H   nicotine  21 mg Transdermal Q0600   thiamine  100 mg Oral Daily   Or   thiamine  100 mg Intravenous Daily   Continuous Infusions:  dextrose 5 % and 0.45% NaCl 150 mL/hr at 07/19/19 0600    Principal Problem:   Altered mental status Active Problems:   Rhabdomyolysis   Sinus tachycardia   Suicidal ideation   Psychosis (Veronica Arias)   LOS: 13 days   A & P  Rhabdomyolysis: Resolving slowly. I have increased her IV fluids. Monitor CK. Likely secondary to severe agitation with antipsychotic medication and possibly secondary to restraints applied for agitation. No clinical evidence of seizures but possibly pseudoseizures and obtain EEG to rule out and EEG showed a normal study within normal limits with no seizures or epileptiform discharges seen throughout the recording.  Anorexia: The patient's mother has refused feeding tube placement or artificial nutrition. SLP has been asked to re-evaluate the patient. They are unable to evaluate the patient, because the patient will not cooperate with  them. The patient remains NPO. I have ordered megace at the mother's request, but I doubt that the patient will take it.   Hypernatremia/Hyperchloremia: Resolved with increased Iv fluid rate. Pt is taking no PO. Monitor. She is receiving D5 1/2 NS. Will follow electrolytes including magnesium and phosphorus.  Hypokalemia: 3.1 this morning. Supplement and monitor.  Abnormal LFTs: Likely related to rhabdomyolysis. Avoid hepatotoxic medications. No abdominal pain on examination. In the setting of rhabdomyolysis next-avoid hepatotoxic medications. Monitor and trend.  Persistent Sinus Tachycardia: Monitor on telemetry. Echocardiogram showed Normal EF. Metoprolol was increased, but the patient is refusing PO. She has been started on IV lopressor.Cardiology was consulted and Dr. Nechama Guard feels that her persistent rates within the 100s 120s are is likely secondary to rhabdo and agitation psych aortic issues.  There is no structural heart disease on TTE and likely the treatment will be addressing underlying cause and no further cardiac work-up is recommended at this time. TSH within normal limits.  Decreased level of consciousness/agitation:  Low grade fever yesterday. Antipsychotics stopped. No infectious etiology has been determined. Neurology ordered MRI which was unremarkable. He plans on doing LP under fluoro tomorrow. No nuchal rigidity/meningeal signs to suggest meningitis. Atelectasis on CXR may be cause for low grade fevers.  Await results of LP/neurology work up. Neurology has had the patient on ativan  1 mg qid. The patient's mother has stated that she does not want her daughter to receive this medication. I have discontinued it. I have discussed the patient with Dr. Mariea Clonts. She will  Evaluate the patient. I appreciate her assistance.  Low grade fevers with sinus tachycardia: Infectious cause? UA negative and urine cultures are negative thus far. Blood cultures negative CXR demonstrated only  atelectasis. Consider possible withdrawal from substance? UDS positive only for benzoes. Dr. Lorraine Lax from neurology is consulting. I appreciate his help.   Suicidal Ideation, Agitation/Psychosis: The patient has been evaluated by psychiatry. They have recommended stopping Depakote and continuing Ativan for now. They plan to admit to inpatient psych after medical clearance at [facility]. Suicide precautions, sitter at bedside. Continue to Monitor. Continue 1:1 sitter.   Renal insufficiency/AKI, worsening slowly: Due to rhabdomyolysis and urinary retention and patient not accepting PO. Will increase IV fluids. Avoid nephrotoxic substances and hypotension. Monitor. 1.32. Today.  Renal ultrasound is negative. Foley catheter is in place.  Hyperphosphatemia: Due to AKI and poor hydration. IV fluids increased. Monitor.  Normocytic Anemia: Hemoglobin stable for last couple of days at 11.3 and 11.0. Monitor.Likely dilutional drop in the setting of IV fluid resuscitation. Checked anemia panel and patient's iron level was 25, U IBC was 304, TIBC was 329, saturation ratios were 8%, ferritin was 66, folate level was 17.9, and vitamin B12 level was 1033. Monitor.  Obesity:  Estimated body mass index is 36.31 kg/m as calculated from the following. Once recovered the patient should seek assistance from per PCP for a sensible weight loss plan.  The patient was seen and examined by me. I have spent 48 minutes in her evaluation and care. I have sent more than 50% of this in discussion with the patient's mother and her aunt. See ADDENDUM below.  DVT prophylaxis: Enoxaparin 40 mg sq Daily  Code Status: FULL CODE  Family Communication: Discussed the patient with her mother today. All questions answered to the best of my ability. Tentative plan for a meeting with the patient's mother and father tomorrow afternoon after lunch. Disposition Plan: Inpatient psychiatry vs placement in rehab facility.   Hazael Olveda, DO [facility] Direct contact: see [email or web address]  7PM-7AM contact night coverage as above 07/19/2019, 6:33 PM  LOS: 4 days   ADDENDUM: Today I met with the patient's mother as well as the mother's sister to discuss the patient. At the mother's direction Ativan was discontinued. It does seem that the patient is more responsive since this change was made. However, the patient is still not eating. The mother is still not allowing placement of cortrak and initiation of tube feeds. I have stressed to her that it is inappropriate that we leave the patient without nutrition for as long as she has been without it. I have also stressed to her that the patient would not survive without IV fluids with D5. She says that if the patient is not eating by Sunday she will reconsider it. Both psychiatry and neurology have recommended around the clock ativan as she was on earlier this week until mother demanded that it be stopped. As a matter of fact psychiatry has recommended that the dose be increased. I have recommended to the patient's mother and aunt that they direct their questions to psychiatry and neurology themselves as I cannot speak for them. They have also both recommended ECT which the mother is refusing.  I have started the patient on megace at the mother's request. However, the patient  is not taking PO, so I do not know that the patient will take it. The mother continues to insist that the patient be discharged to the [facility] in [place]. I have tried to explain to her that they are unlikely to take a patient that cannnot maintain her own hydration and nutrition. Should the patient's mother continue to deny the patient nutrition and therapies that have been recommended by neurology and psychiatry, Ethic consult may be in order at the beginning of next week.

## 2019-07-19 NOTE — Evaluation (Signed)
Occupational Therapy Evaluation Patient Details Name: Veronica BaileyKyla Arias MRN: [id number] DOB: [birth year] Today's Date: 07/19/2019    History of Present Illness 28 y.o. female with medical history significant of asthma who presented to the ED on 07/01/2019 for evaluation of suicidal ideation, paranoia, and bizarre behavior. 07/09/19 rapid response called for fever and tachycardia, developed rhabdomyolysis.    Clinical Impression   Pt was functioning independently and living alone prior to admission. Pt able to nod her head yes/no and follow simple commands inconsistently. She did not verbalize. She requires +2 assistance for bed level mobility and was unable to stand. Her sitting balance is zero with strong posterior bias. Pt is dependent in all ADL. Pt will need further rehab when medically stable. [facility]. Will follow acutely.    Follow Up Recommendations  Supervision/Assistance - 24 hour(Inpatient Rehab)    Equipment Recommendations  3 in 1 bedside commode;Wheelchair (measurements OT);Wheelchair cushion (measurements OT);Hospital bed    Recommendations for Other Services       Precautions / Restrictions Precautions Precautions: Fall Restrictions Weight Bearing Restrictions: No      Mobility Bed Mobility Overal bed mobility: Needs Assistance Bed Mobility: Supine to Sit;Sit to Supine     Supine to sit: Total assist;+2 for physical assistance Sit to supine: Total assist;+2 for physical assistance   General bed mobility comments: Pt unable to initiate movement, does not resist movement to EoB  Transfers Overall transfer level: Needs assistance Equipment used: 2 person hand held assist Transfers: Sit to/from Stand Sit to Stand: +2 physical assistance;Total assist         General transfer comment: total A, unable to extend LE to come to standing, fatigue in LE noted by shaking prior to sitting back dow    Balance Overall balance assessment: Needs  assistance Sitting-balance support: Bilateral upper extremity supported;Feet supported;No upper extremity supported;Single extremity supported Sitting balance-Leahy Scale: Zero Sitting balance - Comments: pt with 10 second period of unassisted seating, otherwise vasilated between min-maxA Postural control: Posterior lean   Standing balance-Leahy Scale: Zero                             ADL either performed or assessed with clinical judgement   ADL                                         General ADL Comments: requires total assist     Vision Baseline Vision/History: Wears glasses Wears Glasses: At all times Patient Visual Report: No change from baseline       Perception     Praxis      Pertinent Vitals/Pain Pain Assessment: Faces Faces Pain Scale: Hurts a little bit Pain Location: generalized with movement Pain Descriptors / Indicators: Grimacing;Guarding Pain Intervention(s): Monitored during session;Repositioned     Hand Dominance Right   Extremity/Trunk Assessment Upper Extremity Assessment Upper Extremity Assessment: Generalized weakness;RUE deficits/detail;LUE deficits/detail RUE Deficits / Details: squeezed therapist's hand slightly, no other voluntary movement elicited, tremulous at times RUE Coordination: decreased fine motor;decreased gross motor LUE Deficits / Details: squeezed therapist's hand slightly, no other voluntary movement, tremulous at times LUE Coordination: decreased fine motor;decreased gross motor   Lower Extremity Assessment Lower Extremity Assessment: Defer to PT evaluation LLE Deficits / Details: PROM WFL, strength grossly 2+/5 from movement and attempt at standing   Cervical /  Trunk Assessment Cervical / Trunk Assessment: Other exceptions Cervical / Trunk Exceptions: weakness, strong posterior lean   Communication Communication Communication: Expressive difficulties;Other (comment)(no verbalization, uses some  facial expressions)   Cognition Arousal/Alertness: Awake/alert Behavior During Therapy: Anxious;Restless Overall Cognitive Status: Impaired/Different from baseline Area of Impairment: Following commands;Awareness;Problem solving;Safety/judgement;Attention                   Current Attention Level: Focused   Following Commands: Follows one step commands inconsistently;Follows one step commands with increased time Safety/Judgement: Decreased awareness of safety Awareness: Intellectual Problem Solving: Slow processing;Difficulty sequencing;Requires verbal cues;Requires tactile cues;Decreased initiation General Comments: pt with small window where she was answering yes/no questions with nodding, although not consistently correctly per mother   General Comments  VSS, Mother present during session provided information on PLOF and home set up as well as accuracy of pt response    Exercises     Shoulder Instructions      Home Living Family/patient expects to be discharged to:: Private residence Living Arrangements: Alone Available Help at Discharge: Family Type of Home: Apartment Home Access: Stairs to enter Secretary/administrator of Steps: 3rd floor   Home Layout: One level     Bathroom Shower/Tub: Chief Strategy Officer: Standard     Home Equipment: None          Prior Functioning/Environment Level of Independence: Independent        Comments: mother provided information, Living alone, independent works as [employer] ge[employer]        OT Problem List: Decreased strength;Decreased activity tolerance;Impaired balance (sitting and/or standing);Decreased coordination;Decreased cognition;Decreased knowledge of use of DME or AE;Obesity;Pain      OT Treatment/Interventions: Self-care/ADL training;DME and/or AE instruction;Cognitive remediation/compensation;Patient/family education;Balance training;Therapeutic activities    OT Goals(Current goals can be found  in the care plan section) Acute Rehab OT Goals Patient Stated Goal: none stated OT Goal Formulation: Patient unable to participate in goal setting Time For Goal Achievement: 08/02/19 Potential to Achieve Goals: Fair ADL Goals Pt Will Perform Eating: (P) with mod assist;sitting Pt Will Perform Grooming: (P) with mod assist;sitting Pt Will Perform Upper Body Dressing: (P) with mod assist;sitting Pt Will Transfer to Toilet: (P) with +2 assist;with mod assist;bedside commode;stand pivot transfer Pt/caregiver will Perform Home Exercise Program: (P) Increased strength;Both right and left upper extremity;With minimal assist Additional ADL Goal #1: (P) Pt will demonstrate fair sitting balance as a precursor to ADL. Additional ADL Goal #2: (P) Pt will perform bed mobility with moderate assistance in preparation for ADL.  OT Frequency: Min 2X/week   Barriers to D/C:            Co-evaluation PT/OT/SLP Co-Evaluation/Treatment: Yes Reason for Co-Treatment: Necessary to address cognition/behavior during functional activity;For patient/therapist safety PT goals addressed during session: Mobility/safety with mobility;Balance;Strengthening/ROM OT goals addressed during session: ADL's and self-care;Strengthening/ROM      AM-PAC OT "6 Clicks" Daily Activity     Outcome Measure Help from another person eating meals?: Total Help from another person taking care of personal grooming?: Total Help from another person toileting, which includes using toliet, bedpan, or urinal?: Total Help from another person bathing (including washing, rinsing, drying)?: Total Help from another person to put on and taking off regular upper body clothing?: Total Help from another person to put on and taking off regular lower body clothing?: Total 6 Click Score: 6   End of Session Equipment Utilized During Treatment: Gait belt Nurse Communication: Mobility status  Activity Tolerance: Patient tolerated  treatment  well Patient left: in bed;with call bell/phone within reach;with nursing/sitter in room;with family/visitor present  OT Visit Diagnosis: Unsteadiness on feet (R26.81);Pain;Muscle weakness (generalized) (M62.81);Other symptoms and signs involving cognitive function                Time: 1135-1159 OT Time Calculation (min): 24 min Charges:  OT General Charges $OT Visit: 1 Visit OT Evaluation $OT Eval Moderate Complexity: 1 Mod  Nestor Lewandowsky, OTR/L Acute Rehabilitation Services Pager: [phone] Office: [phone]  Malka So 07/19/2019, 1:48 PM

## 2019-07-19 NOTE — Progress Notes (Signed)
Pt has episodes of  Agitation and anxiety at shift change  . Had attempted to pull out her foley catheter and IV access this AM . Was placed on mitten on both arm this AM Dr. Silas [place] text paged and updated

## 2019-07-19 NOTE — Evaluation (Signed)
Physical Therapy Evaluation Patient Details Name: Veronica Arias MRN: [id number] DOB: December 03, 1990 Today's Date: 07/19/2019   History of Present Illness  28 y.o. female with medical history significant of asthma who presented to the ED on 07/01/2019 for evaluation of suicidal ideation, paranoia, and bizarre behavior. 07/09/19 rapid response called for fever and tachycardia, developed rhabdomyolysis.   Clinical Impression  Per mother who was present in room during Evaluation, prior to hospitalization pt was independent, teaching HS art, living in apartment with 3 flights of steps to enter. Pt is limited by decreased ability to participate due to decreased cognition. Pt did not verbalize during session was able to occasionally answer questions correctly with nodding. Pt responds to her mother and is expressive with laughing and frowning not always appropriately to situation. Pt mobility is limited by decreased ability to control movement. Pt intermittently able to squeeze hands, kick legs and engage core muscles for static sitting. Bed mobility and transfers are total assist. PT recommending d/c to [facility] for treatment of cognition while there recommend participation in Physical Therapy to regain independence. PT will continue to follow acutely.    Follow Up Recommendations Other (comment)([facility] )    Equipment Recommendations  None recommended by PT    Recommendations for Other Services       Precautions / Restrictions Precautions Precautions: Fall Restrictions Weight Bearing Restrictions: No      Mobility  Bed Mobility Overal bed mobility: Needs Assistance Bed Mobility: Supine to Sit;Sit to Supine     Supine to sit: Total assist Sit to supine: Total assist   General bed mobility comments: Pt unable to initiate movement, does not resist movement to EoB  Transfers Overall transfer level: Needs assistance Equipment used: None Transfers: Sit to/from Stand Sit to Stand:  Total assist         General transfer comment: total A, unable to extend LE to come to standing, fatigue in LE noted by shaking prior to sitting back dow     Balance Overall balance assessment: Needs assistance Sitting-balance support: Bilateral upper extremity supported;Feet supported;No upper extremity supported;Single extremity supported Sitting balance-Leahy Scale: Zero Sitting balance - Comments: pt with 10 second period of unassisted seating, otherwise vasilated between min-maxA                                     Pertinent Vitals/Pain Pain Assessment: Faces Faces Pain Scale: Hurts a little bit Pain Location: generalized with movement Pain Descriptors / Indicators: Grimacing;Guarding Pain Intervention(s): Limited activity within patient's tolerance;Monitored during session;Repositioned    Home Living Family/patient expects to be discharged to:: Private residence Living Arrangements: Alone   Type of Home: Apartment Home Access: Stairs to enter   Secretary/administratorntrance Stairs-Number of Steps: 3rd floor Home Layout: One level Home Equipment: None      Prior Function Level of Independence: Independent         Comments: mother provided information, Living alone, independent 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     Hand Dominance        Extremity/Trunk Assessment   Upper Extremity Assessment Upper Extremity Assessment: Defer to OT evaluation    Lower Extremity Assessment Lower Extremity Assessment: LLE deficits/detail LLE Deficits / Details: PROM WFL, strength grossly 2+/5 from movement and attempt at standing    Cervical / Trunk Assessment Cervical / Trunk Assessment: Normal  Communication   Communication: Expressive difficulties;Other (comment)(no verbalization during session )  Cognition Arousal/Alertness: Awake/alert Behavior During Therapy: Anxious;Restless Overall Cognitive Status: Impaired/Different from baseline Area of Impairment: Following  commands;Awareness;Problem solving;Safety/judgement;Attention                   Current Attention Level: Focused   Following Commands: Follows one step commands inconsistently;Follows one step commands with increased time Safety/Judgement: Decreased awareness of safety(decreased righting without support) Awareness: Intellectual Problem Solving: Slow processing;Difficulty sequencing;Requires verbal cues;Requires tactile cues;Decreased initiation General Comments: pt with small window where she was answering yes/no questions with nodding, although not consistently correctly per mother      General Comments General comments (skin integrity, edema, etc.): VSS, Mother present during session provided information on PLOF and home set up as well as accuracy of pt response        Assessment/Plan    PT Assessment Patient needs continued PT services  PT Problem List Decreased strength;Decreased activity tolerance;Decreased balance;Decreased cognition;Decreased coordination;Decreased mobility;Decreased safety awareness       PT Treatment Interventions DME instruction;Gait training;Functional mobility training;Therapeutic activities;Therapeutic exercise;Balance training;Neuromuscular re-education;Cognitive remediation;Patient/family education    PT Goals (Current goals can be found in the Care Plan section)  Acute Rehab PT Goals Patient Stated Goal: none stated PT Goal Formulation: Patient unable to participate in goal setting Time For Goal Achievement: 08/02/19 Potential to Achieve Goals: Fair    Frequency Min 2X/week   Barriers to discharge        Co-evaluation PT/OT/SLP Co-Evaluation/Treatment: Yes Reason for Co-Treatment: Complexity of the patient's impairments (multi-system involvement);For patient/therapist safety;Necessary to address cognition/behavior during functional activity PT goals addressed during session: Mobility/safety with mobility;Balance;Strengthening/ROM OT  goals addressed during session: Strengthening/ROM;ADL's and self-care       AM-PAC PT "6 Clicks" Mobility  Outcome Measure Help needed turning from your back to your side while in a flat bed without using bedrails?: Total Help needed moving from lying on your back to sitting on the side of a flat bed without using bedrails?: Total Help needed moving to and from a bed to a chair (including a wheelchair)?: Total Help needed standing up from a chair using your arms (e.g., wheelchair or bedside chair)?: Total Help needed to walk in hospital room?: Total Help needed climbing 3-5 steps with a railing? : Total 6 Click Score: 6    End of Session Equipment Utilized During Treatment: Gait belt Activity Tolerance: Patient tolerated treatment well Patient left: in bed;with nursing/sitter in room;with family/visitor present;with restraints reapplied(mittens) Nurse Communication: Mobility status;Other (comment)(IV leaking) PT Visit Diagnosis: Unsteadiness on feet (R26.81);Other abnormalities of gait and mobility (R26.89);Muscle weakness (generalized) (M62.81);Difficulty in walking, not elsewhere classified (R26.2);Other symptoms and signs involving the nervous system (O27.035)    Time: 0093-8182 PT Time Calculation (min) (ACUTE ONLY): 24 min   Charges:   PT Evaluation $PT Eval Moderate Complexity: 1 Mod          Sorrel Cassetta B. Migdalia Dk PT, DPT Acute Rehabilitation Services Pager [phone] Office [phone]   [place] 07/19/2019, 12:26 PM

## 2019-07-19 NOTE — TOC Progression Note (Signed)
Transition of Care [facility]) - Progression Note    Patient Details  Name: Veronica Arias MRN: [id number] Date of Birth: [birth year]  Transition of Care [facility]) CM/SW Contact  Graves-Bigelow, Ocie Cornfield, RN Phone Number: 07/19/2019, 3:03 PM  Clinical Narrative:  [facility] can only accept patients with oral intake. The mother had a question regarding if she would be a candidate if she had a feeding tube. CM will continue to follow for transition of care needs.     Expected Discharge Plan: IP Rehab Facility Barriers to Discharge: Continued Medical Work up(Referral sent to [facility])  Expected Discharge Plan and Services Expected Discharge Plan: [facility]   Discharge Planning Services: CM Consult Post Acute Care Choice: NA Living arrangements for the past 2 months: Apartment                                       Social Determinants of Health (SDOH) Interventions    Readmission Risk Interventions No flowsheet data found.

## 2019-07-19 NOTE — Progress Notes (Signed)
Cortrak Tube Team Note:  Consult received to place a Cortrak feeding tube.  Per MD notes pt's mom refuses a feeding tube at this time. Per MD will d/c cortrak consult for now.   Please re-consult as needed.   [place], [place], [place] Pager [phone] After Hours Pager

## 2019-07-20 LAB — COMPREHENSIVE METABOLIC PANEL
ALT: 86 U/L — ABNORMAL HIGH (ref 0–44)
AST: 64 U/L — ABNORMAL HIGH (ref 15–41)
Albumin: 3 g/dL — ABNORMAL LOW (ref 3.5–5.0)
Alkaline Phosphatase: 53 U/L (ref 38–126)
Anion gap: 7 (ref 5–15)
BUN: 7 mg/dL (ref 6–20)
CO2: 23 mmol/L (ref 22–32)
Calcium: 9.1 mg/dL (ref 8.9–10.3)
Chloride: 107 mmol/L (ref 98–111)
Creatinine, Ser: 0.79 mg/dL (ref 0.44–1.00)
GFR calc Af Amer: >60 mL/min (ref >60)
GFR calc non Af Amer: >60 mL/min (ref >60)
Glucose, Bld: 129 mg/dL — ABNORMAL HIGH (ref 70–99)
Potassium: 3.4 mmol/L — ABNORMAL LOW (ref 3.5–5.1)
Sodium: 137 mmol/L (ref 135–145)
Total Bilirubin: 0.4 mg/dL (ref 0.3–1.2)
Total Protein: 6.8 g/dL (ref 6.5–8.1)

## 2019-07-20 LAB — GLUCOSE, CAPILLARY
Glucose-Capillary: 102 mg/dL — ABNORMAL HIGH (ref 70–99)
Glucose-Capillary: 107 mg/dL — ABNORMAL HIGH (ref 70–99)
Glucose-Capillary: 112 mg/dL — ABNORMAL HIGH (ref 70–99)
Glucose-Capillary: 112 mg/dL — ABNORMAL HIGH (ref 70–99)
Glucose-Capillary: 116 mg/dL — ABNORMAL HIGH (ref 70–99)
Glucose-Capillary: 119 mg/dL — ABNORMAL HIGH (ref 70–99)

## 2019-07-20 LAB — CBC WITH DIFFERENTIAL/PLATELET
Abs Immature Granulocytes: 0.04 10*3/uL (ref 0.00–0.07)
Basophils Absolute: 0.1 10*3/uL (ref 0.0–0.1)
Basophils Relative: 1 %
Eosinophils Absolute: 0.1 10*3/uL (ref 0.0–0.5)
Eosinophils Relative: 1 %
HCT: 32.2 % — ABNORMAL LOW (ref 36.0–46.0)
Hemoglobin: 10.2 g/dL — ABNORMAL LOW (ref 12.0–15.0)
Immature Granulocytes: 1 %
Lymphocytes Relative: 26 %
Lymphs Abs: 2.2 10*3/uL (ref 0.7–4.0)
MCH: 26.8 pg (ref 26.0–34.0)
MCHC: 31.7 g/dL (ref 30.0–36.0)
MCV: 84.5 fL (ref 80.0–100.0)
Monocytes Absolute: 0.8 10*3/uL (ref 0.1–1.0)
Monocytes Relative: 10 %
Neutro Abs: 5.3 10*3/uL (ref 1.7–7.7)
Neutrophils Relative %: 61 %
Platelets: 475 10*3/uL — ABNORMAL HIGH (ref 150–400)
RBC: 3.81 MIL/uL — ABNORMAL LOW (ref 3.87–5.11)
RDW: 12.8 % (ref 11.5–15.5)
WBC: 8.4 10*3/uL (ref 4.0–10.5)
nRBC: 0 % (ref 0.0–0.2)

## 2019-07-20 MED ORDER — POTASSIUM CHLORIDE 10 MEQ/100ML IV SOLN
10.0000 meq | INTRAVENOUS | Status: AC
  Administered 2019-07-20 (×4): 10 meq via INTRAVENOUS
  Filled 2019-07-20 (×4): qty 100

## 2019-07-20 NOTE — Progress Notes (Addendum)
PROGRESS NOTE  [name] DVV:[id number] DOB: [birth year] DOA: 07/01/2019 PCP: Patient, No Pcp Per  Brief History   Veronica Stimpsonis a 28 y.o.femalewith medical history significant ofasthma who presented to the ED on 07/01/2019 for evaluation of suicidal ideation, paranoia, and bizarre behavior. Patient had recent ED visit 2 days ago for similar behavior. Per police report, she was found on the balcony of her apartment throwing things and saying she was suicidal and was going to jump. On initial labs, no leukocytosis, blood ethanol level negative, acetaminophen level negative, salicylate level negative.Transaminases elevated (AST 76, ALT 145), remainder of LFTs normal. Beta-hCG negative. COVID 19 rapid test negative. TSH normal. UDS positive for benzodiazepines. Labs done on 07/03/2019 showing CK 2998 and has continued to worsen since then. On 8/13, CK 4536, blood glucose 63. Blood glucose subsequently improved. Today, CK uptrending 4011 >4037. Transaminases improved (AST 70, ALT 90). Head CT done today showing no acute intracranial abnormality. Patient has received a total of 6 L IV fluid boluses in the past 2 days. However, CK continue's to trend up and patient is persistently tachycardic with heart rate up to 130s. She has continued to be highly agitated throughout her ED stay for the past 4 days.Trying to pull out IV lines etc. Refusing p.o.'s. Patient has even managed to get out of soft restraints and has required security assistance to get her back to her room. Required multiple rounds of antipsychotics,benzodiazepines, diphenhydramineetc. for the past 4 days.Due to CK uptrending, psychiatry recommended stopping antipsychotics/ Zyprexaas they may be causing rhabdo. Accepted by inpatient psychiatry, awaiting bed placementand medical clearance.Internal medicine teaching service wascalledand declined hospital admission. [facility] callledto admit patient formgmt of  rhabdomyolysis, persistent tachycardia, agitation/paranoia/hallucinations.  On the evening of 07/09/2019 the patient had a rapid response called due to fever and tachycardia. She was moved to a telemetry floor. Mother is at bedside today and states that the patient was awake, but not herself earlier today. EEG negative for seizure.   Neurology was consulted. MRI was negative. LP was performed under fluoro on 07/13/2019. It was negative for infection. Autoimmune work up is pending, and the patient is now undergoing a long term EEG. Autoimmune studies on CSF is pending.   The patient's mother has refused feeding tube placement or artificial nutrition. SLP has been asked to re-evaluate the patient. They are unable to evaluate the patient, because the patient will not cooperate with them. The patient remains NPO. On 07/16/2019 the patient's mother also stated that she did not wish for the patient to receive any more ativan. The patient's mother wishes for the [facility] [place]. She is being evaluated for this.  The patient had episodes of agitation early this morning with attempts to remove foley and IV fluids.  Consultants   Neurology  Psychiatry  Procedures   EEG  Antibiotics   Anti-infectives (From admission, onward)   Start     Dose/Rate Route Frequency Ordered Stop   07/09/19 1900  Ampicillin-Sulbactam (UNASYN) 3 g in sodium chloride 0.9 % 100 mL IVPB     3 g 200 mL/hr over 30 Minutes Intravenous Every 8 hours 07/09/19 1845 07/16/19 1904     Subjective  The patient appears fully awake, although non-verbal upon my visit today. No acute distress. Sitter is encouraged to try to feed her if she is awake.  Objective   Vitals:  Vitals:   07/20/19 1100 07/20/19 1300  BP: (!) 152/98 (!) 152/98  Pulse:  Resp:    Temp: 98.8 F (37.1 C)   SpO2: 100% 100%    Exam:  Constitutional: The patient is lying with her eyes open this morning.  She is intermittently making eye contact with me. She remains non-verbal. Respiratory:   No increased work of breathing  No wheezes, rales, or rhonchi.  No tactile fremitus.  Cardiovascular:   Tachycardic at greater than 100 BPM.  No murmurs, ectopy or gallups.  No lateral PMI. No thrills. Abdomen:   Abdomen is soft, non-tender, non-distended.  No hernias, masses, or organomegaly  Normoactive bowel sounds. Musculoskeletal:   No cyanosis clubbing or edema. Skin:   No rashes, lesions, ulcers  palpation of skin: no induration or nodules Neurologic:   Patient is unable to cooperate with exam. Psychiatric:  Patient is unable to cooperate with exam.  I have personally reviewed the following:   Today's Data   Vitals, BMP, CBC  Other Data   EEG negative.  Scheduled Meds:  enoxaparin (LOVENOX) injection  40 mg Subcutaneous Q24H   folic acid  1 mg Oral Daily   megestrol  400 mg Oral Daily   metoprolol tartrate  7.5 mg Intravenous Q6H   nicotine  21 mg Transdermal Q0600   thiamine  100 mg Oral Daily   Or   thiamine  100 mg Intravenous Daily   Continuous Infusions:  dextrose 5 % and 0.45% NaCl 150 mL/hr at 07/20/19 1158    Principal Problem:   Altered mental status Active Problems:   Rhabdomyolysis   Sinus tachycardia   Suicidal ideation   Psychosis (HCC)   LOS: 14 days   A & P  Rhabdomyolysis: Resolving slowly. I have increased her IV fluids. Monitor CK. Likely secondary to severe agitation with antipsychotic medication and possibly secondary to restraints applied for agitation. No clinical evidence of seizures but possibly pseudoseizures and obtain EEG to rule out and EEG showed a normal study within normal limits with no seizures or epileptiform discharges seen throughout the recording.  Anorexia: The patient's mother has refused feeding tube placement or artificial nutrition. SLP has been asked to re-evaluate the patient. They are unable to  evaluate the patient, because the patient will not cooperate with them. The patient remains NPO. I have ordered megace at the mother's request, but I doubt that the patient will take it. Mother is refusing insertion or use of feeding tube. I have emphasized to her the important of returning nutrition to the fluid and the fact that the patient would not have survived to this point without IV fluids with D5. She says that she will make a decision by Sunday. Must consider ethics consult if either patient does not start eating soon, or the patient's mother does not begin to allow tube feeds.  Hypernatremia/Hyperchloremia: Resolved with increased Iv fluid rate. Pt is taking no PO. Monitor. She is receiving D5 1/2 NS. Will follow electrolytes including magnesium and phosphorus, and supplement as necessary.  Hypokalemia: 3.4 this morning. Supplement and monitor.  Abnormal LFTs: Likely related to rhabdomyolysis. Avoid hepatotoxic medications. No abdominal pain on examination. In the setting of rhabdomyolysis next-avoid hepatotoxic medications. Monitor and trend.  Persistent Sinus Tachycardia: Monitor on telemetry. Echocardiogram showed Normal EF. Metoprolol was increased, but the patient is refusing PO. She has been started on IV lopressor.Cardiology was consulted and Dr. Gaynelle ArabianShuman feels that her persistent rates within the 100s 120s are is likely secondary to rhabdo and agitation psych aortic issues.  There is no structural heart disease on  TTE and likely the treatment will be addressing underlying cause and no further cardiac work-up is recommended at this time. TSH within normal limits.  Decreased level of consciousness/agitation:  Low grade fever yesterday. Antipsychotics stopped. No infectious etiology has been determined. Neurology ordered MRI which was unremarkable. LP was negative for infectious causes. No nuchal rigidity/meningeal signs to suggest meningitis. Autoimmune work up on CSF is pending. Neurology  has had the patient on ativan 1 mg qid. The patient's mother has stated that she does not want her daughter to receive this medication. I have discontinued it. I have discussed the patient with Dr. Sharma Covert. She has evaluated the patient. I appreciate her assistance. Both neurology and psychiatry have recommended Ativan protocol, but the mother is refusing this. They have both also recommended ECT, but the mother has refused this as well.  Low grade fevers with sinus tachycardia: Resolved. Infectious cause? UA negative and urine cultures are negative thus far. Blood cultures negative CXR demonstrated only atelectasis. Consider possible withdrawal from substance? UDS positive only for benzoes. Dr. Laurence Slate from neurology is consulting. I appreciate his help.   Suicidal Ideation, Agitation/Psychosis: The patient has been evaluated by psychiatry.   Suicide precautions, sitter at bedside. Continue to Monitor. Continue 1:1 sitter.   Renal insufficiency/AKI, worsening slowly: Due to rhabdomyolysis and urinary retention and patient not accepting PO. Will increase IV fluids. Avoid nephrotoxic substances and hypotension. Monitor. 0.79. Today.  Renal ultrasound is negative. Foley catheter is in place.  Hyperphosphatemia: Due to AKI and poor hydration. IV fluids increased. Monitor.  Normocytic Anemia: Hemoglobin stable for last couple of days at 11.3 and 11.0. Monitor.Likely dilutional drop in the setting of IV fluid resuscitation. Checked anemia panel and patient's iron level was 25, U IBC was 304, TIBC was 329, saturation ratios were 8%, ferritin was 66, folate level was 17.9, and vitamin B12 level was 1033. Monitor.  Obesity:  Estimated body mass index is 36.31 kg/m as calculated from the following. Once recovered the patient should seek assistance from per PCP for a sensible weight loss plan.  The patient was seen and examined by me. I have spent 35 minutes in her evaluation and care.  DVT prophylaxis:  Enoxaparin 40 mg sq Daily  Code Status: FULL CODE  Family Communication: None available today. Disposition Plan: Inpatient psychiatry vs placement in rehab facility.   Ellizabeth Dacruz, DO [facility] Direct contact: see [email or web address]  7PM-7AM contact night coverage as above 07/20/2019, 4:15 PM  LOS: 4 days

## 2019-07-21 LAB — GLUCOSE, CAPILLARY
Glucose-Capillary: 101 mg/dL — ABNORMAL HIGH (ref 70–99)
Glucose-Capillary: 102 mg/dL — ABNORMAL HIGH (ref 70–99)
Glucose-Capillary: 107 mg/dL — ABNORMAL HIGH (ref 70–99)
Glucose-Capillary: 113 mg/dL — ABNORMAL HIGH (ref 70–99)
Glucose-Capillary: 88 mg/dL (ref 70–99)
Glucose-Capillary: 98 mg/dL (ref 70–99)

## 2019-07-21 LAB — COMPREHENSIVE METABOLIC PANEL
ALT: 94 U/L — ABNORMAL HIGH (ref 0–44)
AST: 68 U/L — ABNORMAL HIGH (ref 15–41)
Albumin: 3.1 g/dL — ABNORMAL LOW (ref 3.5–5.0)
Alkaline Phosphatase: 55 U/L (ref 38–126)
Anion gap: 9 (ref 5–15)
BUN: 6 mg/dL (ref 6–20)
CO2: 25 mmol/L (ref 22–32)
Calcium: 9.4 mg/dL (ref 8.9–10.3)
Chloride: 106 mmol/L (ref 98–111)
Creatinine, Ser: 0.77 mg/dL (ref 0.44–1.00)
GFR calc Af Amer: >60 mL/min (ref >60)
GFR calc non Af Amer: >60 mL/min (ref >60)
Glucose, Bld: 117 mg/dL — ABNORMAL HIGH (ref 70–99)
Potassium: 3.7 mmol/L (ref 3.5–5.1)
Sodium: 140 mmol/L (ref 135–145)
Total Bilirubin: 0.5 mg/dL (ref 0.3–1.2)
Total Protein: 7 g/dL (ref 6.5–8.1)

## 2019-07-21 MED ORDER — FERROUS SULFATE 325 (65 FE) MG PO TABS
325.0000 mg | ORAL_TABLET | Freq: Every day | ORAL | Status: DC
  Administered 2019-07-26 – 2019-08-05 (×9): 325 mg via ORAL
  Filled 2019-07-21 (×11): qty 1

## 2019-07-21 NOTE — Progress Notes (Signed)
PROGRESS NOTE  Veronica BaileyKyla Arias WUJ:[id number] DOB: 15-Sep-1991 DOA: 07/01/2019 PCP: Patient, No Pcp Per  Brief History   Veronica Arias a 28 y.o.femalewith medical history significant ofasthma who presented to the ED on 07/01/2019 for evaluation of suicidal ideation, paranoia, and bizarre behavior. Patient had recent ED visit 2 days ago for similar behavior. Per police report, she was found on the balcony of her apartment throwing things and saying she was suicidal and was going to jump. On initial labs, no leukocytosis, blood ethanol level negative, acetaminophen level negative, salicylate level negative.Transaminases elevated (AST 76, ALT 145), remainder of LFTs normal. Beta-hCG negative. COVID 19 rapid test negative. TSH normal. UDS positive for benzodiazepines. Labs done on 07/03/2019 showing CK 2998 and has continued to worsen since then. On 8/13, CK 4536, blood glucose 63. Blood glucose subsequently improved. Today, CK uptrending 4011 >4037. Transaminases improved (AST 70, ALT 90). Head CT done today showing no acute intracranial abnormality. Patient has received a total of 6 L IV fluid boluses in the past 2 days. However, CK continue's to trend up and patient is persistently tachycardic with heart rate up to 130s. She has continued to be highly agitated throughout her ED stay for the past 4 days.Trying to pull out IV lines etc. Refusing p.o.'s. Patient has even managed to get out of soft restraints and has required security assistance to get her back to her room. Required multiple rounds of antipsychotics,benzodiazepines, diphenhydramineetc. for the past 4 days.Due to CK uptrending, psychiatry recommended stopping antipsychotics/ Zyprexaas they may be causing rhabdo. Accepted by inpatient psychiatry, awaiting bed placementand medical clearance.Internal medicine teaching service wascalledand declined hospital admission. [facility] callledto admit patient formgmt of  rhabdomyolysis, persistent tachycardia, agitation/paranoia/hallucinations.  On the evening of 07/09/2019 the patient had a rapid response called due to fever and tachycardia. She was moved to a telemetry floor. Mother is at bedside today and states that the patient was awake, but not herself earlier today. EEG negative for seizure.   Neurology was consulted. MRI was negative. LP was performed under fluoro on 07/13/2019. It was negative for infection. Autoimmune work up is pending, and the patient is now undergoing a long term EEG. Autoimmune studies on CSF is pending.   The patient's mother has refused feeding tube placement or artificial nutrition. SLP has been asked to re-evaluate the patient. They are unable to evaluate the patient, because the patient will not cooperate with them. The patient remains NPO. On 07/16/2019 the patient's mother also stated that she did not wish for the patient to receive any more ativan. The patient's mother [place] for the [place] [facility] She is being evaluated for this.  The patient had episodes of agitation early this morning with attempts to remove foley and IV fluids.  07/21/2019: Patient seen alongside patient's mother.  No new changes.  Discussed briefly with covering psychiatrist.  Consultants   Neurology  Psychiatry  Procedures   EEG  Antibiotics   Anti-infectives (From admission, onward)   Start     Dose/Rate Route Frequency Ordered Stop   07/09/19 1900  Ampicillin-Sulbactam (UNASYN) 3 g in sodium chloride 0.9 % 100 mL IVPB     3 g 200 mL/hr over 30 Minutes Intravenous Every 8 hours 07/09/19 1845 07/16/19 1904     Subjective  Patient will not communicate in consultation. Objective   Vitals:  Vitals:   07/21/19 0800 07/21/19 1200  BP:    Pulse: 90 87  Resp: 18 18  Temp: 98.4 F (36.9 C) 98.7 F (37.1 C)  SpO2:      Exam:  Constitutional: Patient could not or will not speak to me.   Patient moves eyes intermittently inappropriate manner when communicated to.   Respiratory:   Clear to auscultation  Cardiovascular:   S1-S2 Abdomen:  Abdomen is soft, non-tender,  Neurologic:   Patient is awake.  Patient is not communicating with me..   Today's Data   Vitals, BMP, CBC  Other Data   EEG negative.  Scheduled Meds:  enoxaparin (LOVENOX) injection  40 mg Subcutaneous Q24H   folic acid  1 mg Oral Daily   megestrol  400 mg Oral Daily   metoprolol tartrate  7.5 mg Intravenous Q6H   nicotine  21 mg Transdermal Q0600   thiamine  100 mg Oral Daily   Or   thiamine  100 mg Intravenous Daily   Continuous Infusions:  dextrose 5 % and 0.45% NaCl 150 mL/hr at 07/21/19 1155    Principal Problem:   Altered mental status Active Problems:   Rhabdomyolysis   Sinus tachycardia   Suicidal ideation   Psychosis (HCC)   LOS: 15 days   A & P  Rhabdomyolysis: Resolving slowly. I have increased her IV fluids. Monitor CK. Likely secondary to severe agitation with antipsychotic medication and possibly secondary to restraints applied for agitation. No clinical evidence of seizures but possibly pseudoseizures and obtain EEG to rule out and EEG showed a normal study within normal limits with no seizures or epileptiform discharges seen throughout the recording. 07/21/2019: Rhabdomyolysis has resolved significantly.  Check CPK in the morning.  Anorexia: The patient's mother has refused feeding tube placement or artificial nutrition. SLP has been asked to re-evaluate the patient. They are unable to evaluate the patient, because the patient will not cooperate with them. The patient remains NPO. I have ordered megace at the mother's request, but I doubt that the patient will take it. Mother is refusing insertion or use of feeding tube. I have emphasized to her the important of returning nutrition to the fluid and the fact that the patient would not have survived to this point  without IV fluids with D5. She says that she will make a decision by Sunday. Must consider ethics consult if either patient does not start eating soon, or the patient's mother does not begin to allow tube feeds. 07/21/2019: Patient is possibly refusing to eat.  May be related to the psychiatric problem.  Hypernatremia/Hyperchloremia: Resolved with increased Iv fluid rate. Pt is taking no PO. Monitor. She is receiving D5 1/2 NS. Will follow electrolytes including magnesium and phosphorus, and supplement as necessary. 07/21/2019: Hypernatremia has resolved.  Hypokalemia:  Potassium is 2.7 today.    Abnormal LFTs: Likely related to rhabdomyolysis. Avoid hepatotoxic medications. No abdominal pain on examination. In the setting of rhabdomyolysis next-avoid hepatotoxic medications. Monitor and trend.  Suicidal Ideation, Agitation/Psychosis: The patient has been evaluated by psychiatry.   Suicide precautions, sitter at bedside. Continue to Monitor. Continue 1:1 sitter.   Renal insufficiency/AKI, worsening slowly: Due to rhabdomyolysis and urinary retention and patient not accepting PO. Will increase IV fluids. Avoid nephrotoxic substances and hypotension. Monitor. 0.79. Today.  Renal ultrasound is negative. Foley catheter is in place. 07/21/2019: AKI has resolved.  Hyperphosphatemia: Due to AKI and poor hydration. IV fluids increased. Monitor.  Normocytic Anemia: Hemoglobin stable for last couple of days at 11.3 and 11.0. Monitor.Likely dilutional drop in the setting of IV fluid resuscitation. Checked anemia panel  and patient's iron level was 25, U IBC was 304, TIBC was 329, saturation ratios were 8%, ferritin was 66, folate level was 17.9, and vitamin B12 level was 1033. Monitor. 07/21/2019: Start patient on iron therapy.   Obesity:  Estimated body mass index is 36.31 kg/m as calculated from the following. Once recovered the patient should seek assistance from per PCP for a sensible weight loss  plan.  The patient was seen and examined by me. I have spent 35 minutes in her evaluation and care.  DVT prophylaxis: Enoxaparin 40 mg sq Daily  Code Status: FULL CODE  Family Communication: None available today. Disposition Plan: Inpatient psychiatry vs placement in rehab facility.   Dana Allan MD [facility] Direct contact: see [email or web address]  7PM-7AM contact night coverage as above

## 2019-07-22 LAB — GLUCOSE, CAPILLARY
Glucose-Capillary: 111 mg/dL — ABNORMAL HIGH (ref 70–99)
Glucose-Capillary: 102 mg/dL — ABNORMAL HIGH (ref 70–99)
Glucose-Capillary: 110 mg/dL — ABNORMAL HIGH (ref 70–99)
Glucose-Capillary: 75 mg/dL (ref 70–99)
Glucose-Capillary: 75 mg/dL (ref 70–99)

## 2019-07-22 LAB — COMPREHENSIVE METABOLIC PANEL
ALT: 85 U/L — ABNORMAL HIGH (ref 0–44)
AST: 66 U/L — ABNORMAL HIGH (ref 15–41)
Albumin: 3.1 g/dL — ABNORMAL LOW (ref 3.5–5.0)
Alkaline Phosphatase: 58 U/L (ref 38–126)
Anion gap: 11 (ref 5–15)
BUN: 7 mg/dL (ref 6–20)
CO2: 23 mmol/L (ref 22–32)
Calcium: 9.3 mg/dL (ref 8.9–10.3)
Chloride: 105 mmol/L (ref 98–111)
Creatinine, Ser: 0.81 mg/dL (ref 0.44–1.00)
GFR calc Af Amer: >60 mL/min (ref >60)
GFR calc non Af Amer: >60 mL/min (ref >60)
Glucose, Bld: 115 mg/dL — ABNORMAL HIGH (ref 70–99)
Potassium: 3.4 mmol/L — ABNORMAL LOW (ref 3.5–5.1)
Sodium: 139 mmol/L (ref 135–145)
Total Bilirubin: 0.5 mg/dL (ref 0.3–1.2)
Total Protein: 7.1 g/dL (ref 6.5–8.1)

## 2019-07-22 LAB — CK: Total CK: 3159 U/L — ABNORMAL HIGH (ref 38–234)

## 2019-07-22 LAB — PHOSPHORUS: Phosphorus: 3.5 mg/dL (ref 2.5–4.6)

## 2019-07-22 LAB — MAGNESIUM: Magnesium: 1.7 mg/dL (ref 1.7–2.4)

## 2019-07-22 MED ORDER — POTASSIUM CHLORIDE 10 MEQ/100ML IV SOLN
10.0000 meq | INTRAVENOUS | Status: AC
  Administered 2019-07-22 (×4): 10 meq via INTRAVENOUS
  Filled 2019-07-22 (×4): qty 100

## 2019-07-22 NOTE — Progress Notes (Signed)
  Speech Language Pathology Treatment: Dysphagia  Patient Details Name: Veronica Arias MRN: [id number] DOB: Mar 23, 1991 Today's Date: 07/22/2019 Time: 6962-9528 SLP Time Calculation (min) (ACUTE ONLY): 12 min  Assessment / Plan / Recommendation Clinical Impression  SLP followed up for PO trials, note MD initiation of dysphagia 1 (puree) and thin liquid diet. However pt reportedly continues to refuse POs, not managing saliva. Pt alert, upright in chair at bedside though unable to follow simple commands. Labial tension noted with presentation of ice chip, water via teaspoon. Pt with some intraoral opening with puree trial, total assist for feeding. However no bolus manipulation exhibited with puree and eventual anterior loss exhibited, SLP suctioned remainder of PO via [place]. SLP will continue to follow.   HPI HPI: 28 year old female who presented to the ED on 8/10 with acute onset of encephalopathy and suicidal ideation. She was found to have rhabdomyolysis as well as persistent tachycardia of unclear etiology. She has continued to deteriorate since admission, experiencing fever, diaphoresis, and catatonia.  Thus far all neurological tests are negative. DDx includes autoimmune encephalitis and resolving NMS. Pt has been evaluated by psychiatry; they plan to admit to inpt psych after medically stable.       SLP Plan  Continue with current plan of care       Recommendations  Diet recommendations: Other(comment)(diet intitated by MD; puree thins) Medication Administration: Via alternative means                Oral Care Recommendations: Oral care QID SLP Visit Diagnosis: Dysphagia, unspecified (R13.10) Plan: Continue with current plan of care       [place] MA, CCC-SLP Acute Rehabilitation Services 07/22/2019, 1:47 PM

## 2019-07-22 NOTE — Progress Notes (Signed)
Physical Therapy Treatment Patient Details Name: Veronica BaileyKyla Akre MRN: [id number] DOB: 1991/06/30 Today's Date: 07/22/2019    History of Present Illness 28 y.o. female with medical history significant of asthma who presented to the ED on 07/01/2019 for evaluation of suicidal ideation, paranoia, and bizarre behavior. 07/09/19 rapid response called for fever and tachycardia, developed rhabdomyolysis.     PT Comments    Pt is more interactive today, with increased ability to follow 1-step commands for movement. Pt continues to be limited in safe mobility by decreased coordination and response to commands. Pt requires total A for transfer to EoB. Once in seated able to progress from max A to min guard for sitting EoB. In sitting pt is able to volitionally move all extremities, however not consistently. With 3-musketeer (pt arms over therapists shoulders) pt able to come to standing and take steps toward recliner with maxAx2. D/c plan remains appropriate and PT will continue to work with pt acutely to reach that goal.   Follow Up Recommendations  Other (comment)([facility])     Equipment Recommendations  None recommended by PT       Precautions / Restrictions Precautions Precautions: Fall Restrictions Weight Bearing Restrictions: No    Mobility  Bed Mobility Overal bed mobility: Needs Assistance Bed Mobility: Supine to Sit     Supine to sit: Total assist;+2 for physical assistance     General bed mobility comments: pt initiated movement 1x to bring RLE off the bed;  Transfers Overall transfer level: Needs assistance Equipment used: 2 person hand held assist Transfers: Sit to/from Stand;Stand Pivot Transfers Sit to Stand: +2 physical assistance;Max assist Stand pivot transfers: +2 physical assistance;Max assist       General transfer comment: maxA+2 for 3 muskateers stand-pivot from EOB to recliner;pt initiated small steps         Balance Overall balance assessment: Needs  assistance Sitting-balance support: Bilateral upper extremity supported;Feet supported;No upper extremity supported;Single extremity supported Sitting balance-Leahy Scale: Zero Sitting balance - Comments: minA-maxA for support while seated EOB Postural control: Posterior lean;Left lateral lean Standing balance support: Bilateral upper extremity supported Standing balance-Leahy Scale: Zero Standing balance comment: 3-muskateers technique to stand pt and pivot to recliner;pt initiated small steps                            Cognition Arousal/Alertness: Awake/alert Behavior During Therapy: Anxious;Restless;Flat affect Overall Cognitive Status: Impaired/Different from baseline Area of Impairment: Following commands;Awareness;Problem solving;Safety/judgement;Attention                   Current Attention Level: Focused   Following Commands: Follows one step commands inconsistently;Follows one step commands with increased time Safety/Judgement: Decreased awareness of safety Awareness: Intellectual Problem Solving: Slow processing;Difficulty sequencing;Requires verbal cues;Requires tactile cues;Decreased initiation General Comments: pt not very interactive or responsive at start of session;engaged in music therapy at end of session, pt aroused, started bobbing head to the beat and started laughing and stated "you're so funny", unsure what pt found commical;spoke with pt's mother via phone, who stated pt enjoys 80s and 90s music and soft music;pt enjoys art and painting;educated pt's mom on bringing in familiar objects;educated sitter on playing different music throughout the day and working to communicate yes, no with hand movements         General Comments General comments (skin integrity, edema, etc.): use of music therapy during session, pt with positive arousal response;      Pertinent Vitals/Pain Pain  Assessment: Faces Faces Pain Scale: Hurts little more Pain Location:  generalized with movement Pain Descriptors / Indicators: Grimacing;Guarding Pain Intervention(s): Limited activity within patient's tolerance;Monitored during session;Repositioned           PT Goals (current goals can now be found in the care plan section) Acute Rehab PT Goals Patient Stated Goal: none stated PT Goal Formulation: Patient unable to participate in goal setting Time For Goal Achievement: 08/02/19 Potential to Achieve Goals: Fair Progress towards PT goals: Progressing toward goals    Frequency    Min 2X/week      PT Plan Current plan remains appropriate    Co-evaluation PT/OT/SLP Co-Evaluation/Treatment: Yes Reason for Co-Treatment: Necessary to address cognition/behavior during functional activity PT goals addressed during session: Mobility/safety with mobility;Strengthening/ROM;Balance OT goals addressed during session: ADL's and self-care      AM-PAC PT "6 Clicks" Mobility   Outcome Measure  Help needed turning from your back to your side while in a flat bed without using bedrails?: Total Help needed moving from lying on your back to sitting on the side of a flat bed without using bedrails?: Total Help needed moving to and from a bed to a chair (including a wheelchair)?: Total Help needed standing up from a chair using your arms (e.g., wheelchair or bedside chair)?: Total Help needed to walk in hospital room?: Total Help needed climbing 3-5 steps with a railing? : Total 6 Click Score: 6    End of Session Equipment Utilized During Treatment: Gait belt Activity Tolerance: Patient tolerated treatment well Patient left: in chair;with call bell/phone within reach;with nursing/sitter in room Nurse Communication: Mobility status PT Visit Diagnosis: Unsteadiness on feet (R26.81);Other abnormalities of gait and mobility (R26.89);Muscle weakness (generalized) (M62.81);Difficulty in walking, not elsewhere classified (R26.2);Other symptoms and signs involving the  nervous system (T70.177)     Time: 1000-1049 PT Time Calculation (min) (ACUTE ONLY): 49 min  Charges:  $Therapeutic Activity: 8-22 mins                     Dalbert Stillings B. Migdalia Dk PT, DPT Acute Rehabilitation Services Pager [phone] Office [phone]    [place] 07/22/2019, 1:29 PM

## 2019-07-22 NOTE — Progress Notes (Signed)
PROGRESS NOTE  Glendi Totzke PQD:[id number] DOB: 11-18-1991 DOA: 07/01/2019 PCP: Patient, No Pcp Per  Brief History   Jaryn Stimpsonis a 28 y.o.femalewith medical history significant ofasthma who presented to the ED on 07/01/2019 for evaluation of suicidal ideation, paranoia, and bizarre behavior. Patient had recent ED visit 2 days ago for similar behavior. Per police report, she was found on the balcony of her apartment throwing things and saying she was suicidal and was going to jump. On initial labs, no leukocytosis, blood ethanol level negative, acetaminophen level negative, salicylate level negative.Transaminases elevated (AST 76, ALT 145), remainder of LFTs normal. Beta-hCG negative. COVID 19 rapid test negative. TSH normal. UDS positive for benzodiazepines. Labs done on 07/03/2019 showing CK 2998 and has continued to worsen since then. On 8/13, CK 4536, blood glucose 63. Blood glucose subsequently improved. Today, CK uptrending 4011 >4037. Transaminases improved (AST 70, ALT 90). Head CT done today showing no acute intracranial abnormality. Patient has received a total of 6 L IV fluid boluses in the past 2 days. However, CK continue's to trend up and patient is persistently tachycardic with heart rate up to 130s. She has continued to be highly agitated throughout her ED stay for the past 4 days.Trying to pull out IV lines etc. Refusing p.o.'s. Patient has even managed to get out of soft restraints and has required security assistance to get her back to her room. Required multiple rounds of antipsychotics,benzodiazepines, diphenhydramineetc. for the past 4 days.Due to CK uptrending, psychiatry recommended stopping antipsychotics/ Zyprexaas they may be causing rhabdo. Accepted by inpatient psychiatry, awaiting bed placementand medical clearance.Internal medicine teaching service wascalledand declined hospital admission. [facility] callledto admit patient formgmt of  rhabdomyolysis, persistent tachycardia, agitation/paranoia/hallucinations.  On the evening of 07/09/2019 the patient had a rapid response called due to fever and tachycardia. She was moved to a telemetry floor. Mother is at bedside today and states that the patient was awake, but not herself earlier today. EEG negative for seizure.   Neurology was consulted. MRI was negative. LP was performed under fluoro on 07/13/2019. It was negative for infection. Autoimmune work up is pending, and the patient is now undergoing a long term EEG. Autoimmune studies on CSF is pending.   The patient's mother has refused feeding tube placement or artificial nutrition. SLP has been asked to re-evaluate the patient. They are unable to evaluate the patient, because the patient will not cooperate with them. The patient remains NPO. On 07/16/2019 the patient's mother also stated that she did not wish for the patient to receive any more ativan. The patient's mother wishes for the patient to be discharged [facility] [place]. She is being evaluated for this.  The patient had episodes of agitation early this morning with attempts to remove foley and IV fluids.  07/21/2019: Patient seen alongside patient's mother.  No new changes.  Discussed briefly with covering psychiatrist. 07/22/2019: Patient seen.  Patient is up in his chair.  She is more interactive today, but not speaking to anyone still.  CPK is elevated at greater than 3159.  Potassium is 3.4.  Consultants   Neurology  Psychiatry  Procedures   EEG  Antibiotics   Anti-infectives (From admission, onward)   Start     Dose/Rate Route Frequency Ordered Stop   07/09/19 1900  Ampicillin-Sulbactam (UNASYN) 3 g in sodium chloride 0.9 % 100 mL IVPB     3 g 200 mL/hr over 30 Minutes Intravenous Every 8 hours 07/09/19 1845 07/16/19 1904  Subjective  Patient will not communicate in consultation. Objective   Vitals:  Vitals:   07/22/19 1200  07/22/19 1533  BP: (!) 142/88   Pulse: 88 88  Resp:    Temp: 99.7 F (37.6 C) 100.2 F (37.9 C)  SpO2:  97%    Exam:  Constitutional: Patient sitting in chair, more interactive today but not speaking to me.   Respiratory:   Clear to auscultation  Cardiovascular:   S1-S2 Abdomen:  Abdomen is soft, non-tender,  Neurologic:   Patient is awake.  Patient is not communicating with me..   Today's Data   Vitals, BMP, CBC  Other Data   EEG negative.  Scheduled Meds:  enoxaparin (LOVENOX) injection  40 mg Subcutaneous Q24H   ferrous sulfate  325 mg Oral Q breakfast   folic acid  1 mg Oral Daily   megestrol  400 mg Oral Daily   metoprolol tartrate  7.5 mg Intravenous Q6H   nicotine  21 mg Transdermal Q0600   thiamine  100 mg Oral Daily   Or   thiamine  100 mg Intravenous Daily   Continuous Infusions:  dextrose 5 % and 0.45% NaCl 150 mL/hr at 07/22/19 1850    Principal Problem:   Altered mental status Active Problems:   Rhabdomyolysis   Sinus tachycardia   Suicidal ideation   Psychosis (Mar-Mac)   LOS: 16 days   A & P  Rhabdomyolysis: Resolving slowly. I have increased her IV fluids. Monitor CK. Likely secondary to severe agitation with antipsychotic medication and possibly secondary to restraints applied for agitation. No clinical evidence of seizures but possibly pseudoseizures and obtain EEG to rule out and EEG showed a normal study within normal limits with no seizures or epileptiform discharges seen throughout the recording. 07/21/2019: Rhabdomyolysis has resolved significantly.  Check CPK in the morning. 07/22/2019: Continue to monitor CPK level.  Mobilize patient as much as possible.  Correct abnormal electrolytes.  Check magnesium and phosphorus level.  Anorexia: The patient's mother has refused feeding tube placement or artificial nutrition. SLP has been asked to re-evaluate the patient. They are unable to evaluate the patient, because the patient  will not cooperate with them. The patient remains NPO. I have ordered megace at the mother's request, but I doubt that the patient will take it. Mother is refusing insertion or use of feeding tube. I have emphasized to her the important of returning nutrition to the fluid and the fact that the patient would not have survived to this point without IV fluids with D5. She says that she will make a decision by Sunday. Must consider ethics consult if either patient does not start eating soon, or the patient's mother does not begin to allow tube feeds. 07/21/2019: Patient is possibly refusing to eat.  May be related to the psychiatric problem. 07/22/2019: Patient is still not eating.  Speech evaluation is appreciated.  Hypernatremia/Hyperchloremia: Resolved with increased Iv fluid rate. Pt is taking no PO. Monitor. She is receiving D5 1/2 NS. Will follow electrolytes including magnesium and phosphorus, and supplement as necessary. 07/21/2019: Hypernatremia has resolved.  Hypokalemia:  Potassium is 3.4 today.   IV KCl 40 M EQ over 4 hours. Check magnesium level.  Abnormal LFTs: Likely related to rhabdomyolysis. Avoid hepatotoxic medications. No abdominal pain on examination. In the setting of rhabdomyolysis next-avoid hepatotoxic medications. Monitor and trend.  Suicidal Ideation, Agitation/Psychosis: The patient has been evaluated by psychiatry.   Suicide precautions, sitter at bedside. Continue to Monitor. Continue 1:1 sitter.  Renal insufficiency/AKI, worsening slowly: Due to rhabdomyolysis and urinary retention and patient not accepting PO. Will increase IV fluids. Avoid nephrotoxic substances and hypotension. Monitor. 0.79. Today.  Renal ultrasound is negative. Foley catheter is in place. 07/21/2019: AKI has resolved.  Normocytic Anemia: Hemoglobin stable for last couple of days at 11.3 and 11.0. Monitor.Likely dilutional drop in the setting of IV fluid resuscitation. Checked anemia panel and  patient's iron level was 25, U IBC was 304, TIBC was 329, saturation ratios were 8%, ferritin was 66, folate level was 17.9, and vitamin B12 level was 1033. Monitor. 07/21/2019: Start patient on iron therapy. 07/22/2019: CBC in a.m.  Obesity: Further management on outpatient basis when discharged.  DVT prophylaxis: Enoxaparin 40 mg sq Daily  Code Status: FULL CODE  Family Communication: None available today. Disposition Plan: Inpatient psychiatry vs placement in rehab facility.   Berton MountSylvester Avanelle Pixley MD [facility] Direct contact: see [email or web address]  7PM-7AM contact night coverage as above

## 2019-07-22 NOTE — Progress Notes (Signed)
Occupational Therapy Treatment Patient Details Name: Veronica Arias MRN: [id number] DOB: 1991-11-07 Today's Date: 07/22/2019    History of present illness 28 y.o. female with medical history significant of asthma who presented to the ED on 07/01/2019 for evaluation of suicidal ideation, paranoia, and bizarre behavior. 07/09/19 rapid response called for fever and tachycardia, developed rhabdomyolysis.    OT comments  Upon arrival pt supine in bed, attended to therapist with visual gaze. Pt appears to have visual limitations, squinting eyes and decreased eye control of R eye. Difficult to fully assess due to cognitive limitations. Pt required totalA+2 for bed mobility and mina-maxA for support while sitting EOB. Pt required maxA+2 for stand-pivot from EOB to recliner with use of 3-muskateers method, pt initiated small steps. Pt flat affect majority of session, use of music therapy at end elicited positive arousal response from pt (bobbing head, laughing, and saying "you're so funny"). Spoke with pt's mom via phone conversation who reported pt enjoys 62s and 90s music, painting, and art. Educated pt's mom on bringing familiar items (pictures, etc.) to have in pt's room. Pt will continue to benefit from skilled OT services to maximize safety and independence with ADL/IADL and functional mobility. Will continue to follow acutely and progress as tolerated.    Follow Up Recommendations  Supervision/Assistance - 24 hour(inpatient rehab)    Equipment Recommendations  3 in 1 bedside commode;Wheelchair (measurements OT);Wheelchair cushion (measurements OT);Hospital bed    Recommendations for Other Services      Precautions / Restrictions Precautions Precautions: Fall Restrictions Weight Bearing Restrictions: No       Mobility Bed Mobility Overal bed mobility: Needs Assistance Bed Mobility: Supine to Sit     Supine to sit: Total assist;+2 for physical assistance     General bed mobility  comments: pt initiated movement 1x to bring RLE off the bed;  Transfers Overall transfer level: Needs assistance Equipment used: 2 person hand held assist Transfers: Sit to/from Stand;Stand Pivot Transfers Sit to Stand: +2 physical assistance;Max assist Stand pivot transfers: +2 physical assistance;Max assist       General transfer comment: maxA+2 for 3 muskateers stand-pivot from EOB to recliner;pt initiated small steps     Balance Overall balance assessment: Needs assistance Sitting-balance support: Bilateral upper extremity supported;Feet supported;No upper extremity supported;Single extremity supported Sitting balance-Leahy Scale: Zero Sitting balance - Comments: minA-maxA for support while seated EOB Postural control: Posterior lean;Left lateral lean Standing balance support: Bilateral upper extremity supported Standing balance-Leahy Scale: Zero Standing balance comment: 3-muskateers technique to stand pt and pivot to recliner;pt initiated small steps                           ADL either performed or assessed with clinical judgement   ADL Overall ADL's : Needs assistance/impaired                                       General ADL Comments: total assistance for all ADL;maxA+2 for stand-pivot transfer     Vision       Perception     Praxis      Cognition Arousal/Alertness: Awake/alert Behavior During Therapy: Anxious;Restless;Flat affect Overall Cognitive Status: Impaired/Different from baseline Area of Impairment: Following commands;Awareness;Problem solving;Safety/judgement;Attention                   Current Attention Level: Focused   Following Commands: Follows  one step commands inconsistently;Follows one step commands with increased time Safety/Judgement: Decreased awareness of safety Awareness: Intellectual Problem Solving: Slow processing;Difficulty sequencing;Requires verbal cues;Requires tactile cues;Decreased  initiation General Comments: pt not very interactive or responsive at start of session;engaged in music therapy at end of session, pt aroused, started bobbing head to the beat and started laughing and stated "you're so funny", unsure what pt found commical;spoke with pt's mother via phone, who stated pt enjoys 80s and 90s music and soft music;pt enjoys art and painting;educated pt's mom on bringing in familiar objects;educated sitter on playing different music throughout the day and working to communicate yes, no with hand movements        Exercises     Shoulder Instructions       General Comments use of music therapy during session, pt with positive arousal response;    Pertinent Vitals/ Pain       Pain Assessment: Faces Faces Pain Scale: Hurts little more Pain Location: generalized with movement Pain Descriptors / Indicators: Grimacing;Guarding Pain Intervention(s): Limited activity within patient's tolerance;Monitored during session  Home Living                                          Prior Functioning/Environment              Frequency  Min 2X/week        Progress Toward Goals  OT Goals(current goals can now be found in the care plan section)  Progress towards OT goals: Progressing toward goals  Acute Rehab OT Goals Patient Stated Goal: none stated OT Goal Formulation: Patient unable to participate in goal setting Time For Goal Achievement: 08/02/19 Potential to Achieve Goals: Fair ADL Goals Pt Will Perform Eating: with mod assist;sitting Pt Will Perform Grooming: with mod assist;sitting Pt Will Perform Upper Body Dressing: with mod assist;sitting Pt Will Transfer to Toilet: with +2 assist;with mod assist;bedside commode;stand pivot transfer Pt/caregiver will Perform Home Exercise Program: Increased strength;Both right and left upper extremity;With minimal assist Additional ADL Goal #1: Pt will demonstrate fair sitting balance as a precursor  to ADL. Additional ADL Goal #2: Pt will perform bed mobility with moderate assistance in preparation for ADL.  Plan Discharge plan remains appropriate    Co-evaluation    PT/OT/SLP Co-Evaluation/Treatment: Yes Reason for Co-Treatment: Necessary to address cognition/behavior during functional activity;For patient/therapist safety;To address functional/ADL transfers   OT goals addressed during session: ADL's and self-care      AM-PAC OT "6 Clicks" Daily Activity     Outcome Measure   Help from another person eating meals?: Total Help from another person taking care of personal grooming?: Total Help from another person toileting, which includes using toliet, bedpan, or urinal?: A Lot Help from another person bathing (including washing, rinsing, drying)?: Total Help from another person to put on and taking off regular upper body clothing?: Total Help from another person to put on and taking off regular lower body clothing?: Total 6 Click Score: 7    End of Session Equipment Utilized During Treatment: Gait belt  OT Visit Diagnosis: Unsteadiness on feet (R26.81);Pain;Muscle weakness (generalized) (M62.81);Other symptoms and signs involving cognitive function   Activity Tolerance Patient tolerated treatment well   Patient Left in chair;with call bell/phone within reach;with chair alarm set;with nursing/sitter in room   Nurse Communication Mobility status(use of music therapy)        Time: 4097-3532  OT Time Calculation (min): 40 min  Charges: OT General Charges $OT Visit: 1 Visit OT Treatments $Self Care/Home Management : 8-22 mins $Cognitive Funtion inital: Initial 15 mins  Diona Brownereresa Stace Peace OTR/L Acute Rehabilitation Services Office: [phone]    Rebeca Alerteresa J Allexus Ovens 07/22/2019, 11:15 AM

## 2019-07-22 NOTE — Progress Notes (Signed)
Nutrition Follow-up  DOCUMENTATION CODES:   Obesity unspecified  INTERVENTION:   -If pt remains unable to eat, consider initiation of nutrition support. Recommend:  -Initiating Osmolite 1.5 @ 20 ml/hr; advance 10 ml every 8 hrs to goal rate of  60 ml/hr -Prostat 30 ml daily via tube -MVI daily via tube  Tube feed regimen at goal rate provides 2260 kcals, 105 grams protein, 1094 ml free water  Monitor magnesium, potassium, and phosphorus daily for at least 3 days, MD to replete as needed, as pt is at risk for refeeding syndrome given pt inadequate intake since admission, current NPO status.  NUTRITION DIAGNOSIS:   Inadequate oral intake related to inability to eat as evidenced by NPO status.  Ongoing  GOAL:   Patient will meet greater than or equal to 90% of their needs  Unmet  MONITOR:   TF tolerance, I & O's, Labs, Weight trends  REASON FOR ASSESSMENT:   Consult Calorie Count  ASSESSMENT:   28 year old female with medical history significant of asthma who presented to ED on 8/10 for evaluation of suicidal ideation, paranoia, and bizarre behavior with recent ED visit 2 days prior for similar behavior. Per police report; pt found on the balcony of her apartment throwing things, stating she was suicidal and was going to jump. UDS positive for benzodiazepines.  Patient admitted with rhabdomyolysis, persistent tachycardia, agitation/paranoia/hallucinations.  8/26- advanced to dysphagia 1 diet 8/28- pt mom refused cortrak tube placement  Reviewed I/O's: +1.7 L x 24 hours and -4.1 L since 07/08/19  UOP: 1.8 L x 24 hours  Per chart review, pt remains non-communicative.   Calorie count ordered on 07/19/19. Pt with 0% documented oral intake. Per [facility] meal ordering system, no meals have been ordered throughout hospitalization.   07/20/19 Breakfast: 0% Lunch: 0% Dinner: 0%  07/21/19 Breakfast: 0% Lunch: 0% Dinner: 0%  Average Total intake: 0 kcal (0% of  minimum estimated needs)  0 protein (0% of minimum estimated needs)  Per MD notes, planning ethics consult if pt either does not start eating or mother continues to not be amenable to TF.   Medications reviewed and include megace, thiamine, and dextrose 5%-0.45% sodium chloride infusion @ 150 ml/hr.   Labs reviewed: K: 3.4, CBGS: 102-111.   Diet Order:   Diet Order            DIET - DYS 1 Room service appropriate? Yes; Fluid consistency: Thin  Diet effective now              EDUCATION NEEDS:   No education needs have been identified at this time  Skin:  Skin Assessment: Reviewed RN Assessment  Last BM:  07/15/19  Height:   Ht Readings from Last 1 Encounters:  07/06/19 5\' 3"  (1.6 m)    Weight:   Wt Readings from Last 1 Encounters:  07/20/19 90.3 kg    Ideal Body Weight:  52.3 kg  BMI:  Body mass index is 35.25 kg/m.  Estimated Nutritional Needs:   Kcal:  1800-2000  Protein:  90-105 grams  Fluid:  > 1.8 L    Beau Ramsburg A. Jimmye Norman, RD, LDN, [place] Registered Dietitian II Certified Diabetes Care and Education Specialist Pager: [phone] After hours Pager: [phone]

## 2019-07-23 LAB — RENAL FUNCTION PANEL
Albumin: 3.3 g/dL — ABNORMAL LOW (ref 3.5–5.0)
Anion gap: 13 (ref 5–15)
BUN: 7 mg/dL (ref 6–20)
CO2: 21 mmol/L — ABNORMAL LOW (ref 22–32)
Calcium: 9.4 mg/dL (ref 8.9–10.3)
Chloride: 105 mmol/L (ref 98–111)
Creatinine, Ser: 0.95 mg/dL (ref 0.44–1.00)
GFR calc Af Amer: >60 mL/min (ref >60)
GFR calc non Af Amer: >60 mL/min (ref >60)
Glucose, Bld: 85 mg/dL (ref 70–99)
Phosphorus: 3.4 mg/dL (ref 2.5–4.6)
Potassium: 3.5 mmol/L (ref 3.5–5.1)
Sodium: 139 mmol/L (ref 135–145)

## 2019-07-23 LAB — GLUCOSE, CAPILLARY
Glucose-Capillary: 100 mg/dL — ABNORMAL HIGH (ref 70–99)
Glucose-Capillary: 100 mg/dL — ABNORMAL HIGH (ref 70–99)
Glucose-Capillary: 106 mg/dL — ABNORMAL HIGH (ref 70–99)
Glucose-Capillary: 109 mg/dL — ABNORMAL HIGH (ref 70–99)
Glucose-Capillary: 86 mg/dL (ref 70–99)
Glucose-Capillary: 89 mg/dL (ref 70–99)
Glucose-Capillary: 97 mg/dL (ref 70–99)

## 2019-07-23 LAB — CBC WITH DIFFERENTIAL/PLATELET
Abs Immature Granulocytes: 0.04 10*3/uL (ref 0.00–0.07)
Basophils Absolute: 0.1 10*3/uL (ref 0.0–0.1)
Basophils Relative: 1 %
Eosinophils Absolute: 0.1 10*3/uL (ref 0.0–0.5)
Eosinophils Relative: 1 %
HCT: 32.4 % — ABNORMAL LOW (ref 36.0–46.0)
Hemoglobin: 10.3 g/dL — ABNORMAL LOW (ref 12.0–15.0)
Immature Granulocytes: 1 %
Lymphocytes Relative: 19 %
Lymphs Abs: 1.7 10*3/uL (ref 0.7–4.0)
MCH: 26.1 pg (ref 26.0–34.0)
MCHC: 31.8 g/dL (ref 30.0–36.0)
MCV: 82 fL (ref 80.0–100.0)
Monocytes Absolute: 0.8 10*3/uL (ref 0.1–1.0)
Monocytes Relative: 9 %
Neutro Abs: 6 10*3/uL (ref 1.7–7.7)
Neutrophils Relative %: 69 %
Platelets: 475 10*3/uL — ABNORMAL HIGH (ref 150–400)
RBC: 3.95 MIL/uL (ref 3.87–5.11)
RDW: 12.6 % (ref 11.5–15.5)
WBC: 8.7 10*3/uL (ref 4.0–10.5)
nRBC: 0 % (ref 0.0–0.2)

## 2019-07-23 LAB — MAGNESIUM: Magnesium: 1.7 mg/dL (ref 1.7–2.4)

## 2019-07-23 LAB — CK: Total CK: 3702 U/L — ABNORMAL HIGH (ref 38–234)

## 2019-07-23 MED ORDER — SODIUM CHLORIDE 0.9% FLUSH
10.0000 mL | INTRAVENOUS | Status: DC | PRN
  Administered 2019-07-23: 10 mL
  Filled 2019-07-23: qty 40

## 2019-07-23 MED ORDER — SODIUM CHLORIDE 0.9% FLUSH
10.0000 mL | Freq: Two times a day (BID) | INTRAVENOUS | Status: DC
  Administered 2019-07-25 – 2019-08-02 (×12): 10 mL

## 2019-07-23 NOTE — TOC Progression Note (Signed)
Transition of Care [facility]) - Progression Note    Patient Details  Name: Veronica Arias MRN: [id number] Date of Birth: July 10, 1991  [facility]) CM/SW Contact  Graves-Bigelow, Ocie Cornfield, RN Phone Number: 07/23/2019, 12:46 PM  Clinical Narrative:  CM did receive call from [place] @ the [facility] and patient will not be a candidate for the [facility] at this time. CM did reach out to Mother Joeanne Robicheaux to make her aware. Per mother she asked MD Swayze to have her daughter transferred to another hospital in [facility]. CM will make an additional call to MD to see if this is in process. No further needs from CM at this time.    Expected Discharge Plan: IP Rehab Facility Barriers to Discharge: Continued Medical Work up(Referral sent to [facility])  Expected Discharge Plan and Services Expected Discharge Plan: [place]   Discharge Planning Services: CM Consult Post Acute Care Choice: NA Living arrangements for the past 2 months: Apartment                   Social Determinants of Health (SDOH) Interventions    Readmission Risk Interventions No flowsheet data found.

## 2019-07-23 NOTE — Progress Notes (Signed)
°PROGRESS NOTE ° °Veronica Arias MRN:[id number] DOB: 07/04/1991 DOA: 07/01/2019 °PCP: Patient, No Pcp Per ° °Brief History   °Veronica Arias is a 28 y.o. female with medical history significant of asthma who presented to the ED on 07/01/2019 for evaluation of suicidal ideation, paranoia, and bizarre behavior.  Patient had recent ED visit 2 days ago for similar behavior.  Per police report, she was found on the balcony of her apartment throwing things and saying she was suicidal and was going to jump.  On initial labs, no leukocytosis, blood ethanol level negative, acetaminophen level negative, salicylate level negative.  Transaminases elevated (AST 76, ALT 145), remainder of LFTs normal. Beta-hCG negative.  COVID 19 rapid test negative.  TSH normal.  UDS positive for benzodiazepines.  Labs done on 07/03/2019 showing CK 2998 and has continued to worsen since then.  On 8/13, CK 4536, blood glucose 63.  Blood glucose subsequently improved.  Today, CK uptrending 4011 > 4037.  Transaminases improved (AST 70, ALT 90).  Head CT done today showing no acute intracranial abnormality.  Patient has received a total of 6 L IV fluid boluses in the past 2 days.  However, CK continue's to trend up and patient is persistently tachycardic with heart rate up to 130s.  She has continued to be highly agitated throughout her ED stay for the past 4 days.Trying to pull out IV lines etc.  Refusing p.o.'s.  Patient has even managed to get out of soft restraints and has required security assistance to get her back to her room. Required multiple rounds of antipsychotics, benzodiazepines, diphenhydramine etc. for the past 4 days.  Due to CK uptrending, psychiatry recommended stopping antipsychotics/ Zyprexa as they may be causing rhabdo.  Accepted by inpatient psychiatry, but patient not taking PO. Mother refusing enteral feeds.   Internal medicine teaching service was called and declined hospital admission.  [facility] to admit patient for mgmt  of rhabdomyolysis, persistent tachycardia, agitation/paranoia/hallucinations. °  °On the evening of 07/09/2019 the patient had a rapid response called due to fever and tachycardia. She was moved to a telemetry floor. Mother is at bedside today and states that the patient was awake, but not herself earlier today. EEG negative for seizure.  ° °Neurology was consulted. MRI was negative. LP was performed under fluoro on 07/13/2019. It was negative for infection. Autoimmune work up is pending, and the patient is now undergoing a long term EEG. Autoimmune studies on CSF is pending.  ° °The patient's mother has refused feeding tube placement or artificial nutrition. SLP has been asked to re-evaluate the patient. They are unable to evaluate the patient, because the patient will not cooperate with them. The patient remains NPO. On 07/16/2019 the patient's mother also stated that she did not wish for the patient to receive any more ativan. The patient's mother wishes for the patient to be discharged to the [facility] in [place]. They have declined to accept the patient, because she is not taking PO. I have discussed the patient with [name] today at the request of the patient's mother regarding transfer. I spoke with a [name]. I explained to [name] the patient's presentation and ongoing mental status. I have explained to him the patient's work up and the recommendations of psychiatry and neurology (ativan protocol and ECT). I have explained to him that the patient has taken nothing by mouth for almost 2 weeks. I have also explained to him that the patient's mother has refused all of these interventions. [name] said that he did not feel that [facility] had anything to add to the patient's work up and that simply transferring the patient for the family's convenience was not a consideration in the   current environment when they are at 120% of capacity.   I have consulted [name] to  help up determine the goals of care with the mother. I will also discuss with case management discharge to another LTAC of rehab facility. I have attempted to reach the patient's mother this afternoon to explain to her the response of [facility].   Consultants   Neurology  Psychiatry  Procedures   EEG  LP  Antibiotics   Anti-infectives (From admission, onward)   Start     Dose/Rate Route Frequency Ordered Stop   07/09/19 1900  Ampicillin-Sulbactam (UNASYN) 3 g in sodium chloride 0.9 % 100 mL IVPB     3 g 200 mL/hr over 30 Minutes Intravenous Every 8 hours 07/09/19 1845 07/16/19 1904     Subjective  The patient remains infantile with spontaneous smiles and grimaces and occasional eye contact. As I enter the room the mother is attempting to feed the patient. The patient continues to pocket food. She does not appear to be eating.  Objective   Vitals:  Vitals:   07/23/19 1127 07/23/19 1536  BP: (!) 135/102 125/88  Pulse: (!) 106 (!) 106  Resp: 17 18  Temp: 99.3 F (37.4 C) 98.8 F (37.1 C)  SpO2: 100% 100%    Exam:  Constitutional: The patient is lying with her eyes open this morning. She is intermittently making eye contact with me. She remains non-verbal and infantile in her mannerisms. Respiratory:   No increased work of breathing  No wheezes, rales, or rhonchi.  No tactile fremitus.  Cardiovascular:   Tachycardic at greater than 100 BPM.  No murmurs, ectopy or gallups.  No lateral PMI. No thrills. Abdomen:   Abdomen is soft, non-tender, non-distended.  No hernias, masses, or organomegaly  Normoactive bowel sounds. Musculoskeletal:   No cyanosis clubbing or edema. Skin:   No rashes, lesions, ulcers  palpation of skin: no induration or nodules Neurologic:   Patient is unable to cooperate with exam. Psychiatric:  Patient is unable to cooperate with exam.  I have personally reviewed the following:   Today's Data   Vitals, BMP, CBC  Other  Data   EEG negative.  Scheduled Meds:  enoxaparin (LOVENOX) injection  40 mg Subcutaneous Q24H   ferrous sulfate  325 mg Oral Q breakfast   folic acid  1 mg Oral Daily   megestrol  400 mg Oral Daily   metoprolol tartrate  7.5 mg Intravenous Q6H   nicotine  21 mg Transdermal Q0600   sodium chloride flush  10-40 mL Intracatheter Q12H   thiamine  100 mg Oral Daily   Or   thiamine  100 mg Intravenous Daily   Continuous Infusions:  dextrose 5 % and 0.45% NaCl 150 mL/hr at 07/23/19 1007    Principal Problem:   Altered mental status Active Problems:   Rhabdomyolysis   Sinus tachycardia   Suicidal ideation   Psychosis ([place])   LOS: 17 days   A & P  Decreased level of consciousness/agitation:  Low grade fever yesterday. Antipsychotics stopped. No infectious etiology has been determined. Neurology ordered MRI which was unremarkable. LP was negative for infectious causes. No nuchal rigidity/meningeal signs to suggest meningitis. Autoimmune work up on CSF is pending. Neurology has had the patient on ativan 1 mg qid. The patient's mother has stated that she does not want her daughter to receive this medication. I have discontinued it. I have discussed the patient with Dr. Mariea Clonts. She has evaluated the patient. I  appreciate her assistance. Both neurology and psychiatry have recommended Ativan protocol, but the mother is refusing this. They have both also recommended ECT, but the mother has refused this as well. The patient's mother wishes for the patient to be discharged to the [facility] in [place]. They have declined to accept the patient, because she is not taking PO. I have discussed the patient with [facility] today at the request of the patient's mother regarding transfer. I spoke with a Dr. Dierdre Forth. I explained to Dr. Dierdre Forth the patient's presentation and ongoing mental status. I have explained to him the patient's work up and the recommendations of  psychiatry and neurology (ativan protocol and ECT). I have explained to him that the patient has taken nothing by mouth for almost 2 weeks. I have also explained to him that the patient's mother has refused all of these interventions. Dr. Dierdre Forth said that he did not feel that [facility] had anything to add to the patient's work up and that simply transferring the patient for the family's convenience was not a consideration in the current environment when they are at 120% of capacity. I have consulted Dr. Phillips Odor to help up determine the goals of care with the mother. I will also discuss with case management discharge to another LTAC of rehab facility. I have attempted to reach the patient's mother this afternoon to explain to her the response of [facility].   Rhabdomyolysis: Ongoing. Continue IV fluids. Likely due to agitation. No seizure activitiy on EEG. Mother refusing ativan.  Anorexia: The patient's mother has refused feeding tube placement or artificial nutrition. SLP has been asked to re-evaluate the patient. They are unable to evaluate the patient, because the patient will not cooperate with them. The patient remains NPO. I have ordered megace at the mother's request, but I doubt that the patient will take it. Mother is refusing insertion or use of feeding tube. I have emphasized to her the important of returning nutrition to the fluid and the fact that the patient would not have survived to this point without IV fluids with D5. She says that she will make a decision by Sunday. Must consider ethics consult if either patient does not start eating soon, or the patient's mother does not begin to allow tube feeds.  Hypernatremia/Hyperchloremia: Resolved with increased Iv fluid rate. Pt is taking no PO. Monitor. She is receiving D5 1/2 NS. Will follow electrolytes including magnesium and phosphorus, and supplement as necessary.  Hypokalemia: 3.4 this morning. Supplement and monitor.  Abnormal LFTs: Likely  related to rhabdomyolysis. Avoid hepatotoxic medications. No abdominal pain on examination. In the setting of rhabdomyolysis next-avoid hepatotoxic medications. Monitor and trend.  Persistent Sinus Tachycardia: Monitor on telemetry. Echocardiogram showed Normal EF. Metoprolol was increased, but the patient is refusing PO. She has been started on IV lopressor.Cardiology was consulted and Dr. Gaynelle Arabian feels that her persistent rates within the 100s 120s are is likely secondary to rhabdo and agitation psych aortic issues.  There is no structural heart disease on TTE and likely the treatment will be addressing underlying cause and no further cardiac work-up is recommended at this time. TSH within normal limits.  Low grade fevers with sinus tachycardia: Resolved. Infectious cause? UA negative and urine cultures are negative thus far. Blood cultures negative CXR demonstrated only atelectasis. Consider possible withdrawal from substance? UDS positive only for benzoes. Dr. Laurence Slate from neurology is consulting. I appreciate his help.   Suicidal Ideation, Agitation/Psychosis: The patient has been evaluated  by psychiatry.   Suicide precautions, sitter at bedside. Continue to Monitor. Continue 1:1 sitter.   Renal insufficiency/AKI, worsening slowly: Due to rhabdomyolysis and urinary retention and patient not accepting PO. Will increase IV fluids. Avoid nephrotoxic substances and hypotension. Monitor. 0.79. Today.  Renal ultrasound is negative. Foley catheter is in place.  Hyperphosphatemia: Due to AKI and poor hydration. IV fluids increased. Monitor.  Normocytic Anemia: Hemoglobin stable for last couple of days at 11.3 and 11.0. Monitor.Likely dilutional drop in the setting of IV fluid resuscitation. Checked anemia panel and patient's iron level was 25, U IBC was 304, TIBC was 329, saturation ratios were 8%, ferritin was 66, folate level was 17.9, and vitamin B12 level was 1033. Monitor.  Obesity:  Estimated body  mass index is 36.31 kg/m as calculated from the following. Once recovered the patient should seek assistance from per PCP for a sensible weight loss plan.  The patient was seen and examined by me. I have spent 48 minutes in her evaluation and care.  DVT prophylaxis: Enoxaparin 40 mg sq Daily  Code Status: FULL CODE  Family Communication: None available today. Disposition Plan: Inpatient psychiatry vs placement in rehab facility.   Ronee Ranganathan, DO [facility] Direct contact: see [email or web address]  7PM-7AM contact night coverage as above 07/23/2019, 4:57 PM  LOS: 4 days

## 2019-07-24 LAB — GLUCOSE, CAPILLARY
Glucose-Capillary: 116 mg/dL — ABNORMAL HIGH (ref 70–99)
Glucose-Capillary: 114 mg/dL — ABNORMAL HIGH (ref 70–99)
Glucose-Capillary: 101 mg/dL — ABNORMAL HIGH (ref 70–99)
Glucose-Capillary: 85 mg/dL (ref 70–99)
Glucose-Capillary: 90 mg/dL (ref 70–99)

## 2019-07-24 LAB — CBC WITH DIFFERENTIAL/PLATELET
Abs Immature Granulocytes: 0.03 10*3/uL (ref 0.00–0.07)
Basophils Absolute: 0.1 10*3/uL (ref 0.0–0.1)
Basophils Relative: 1 %
Eosinophils Absolute: 0.1 10*3/uL (ref 0.0–0.5)
Eosinophils Relative: 1 %
HCT: 33.3 % — ABNORMAL LOW (ref 36.0–46.0)
Hemoglobin: 10.3 g/dL — ABNORMAL LOW (ref 12.0–15.0)
Immature Granulocytes: 0 %
Lymphocytes Relative: 30 %
Lymphs Abs: 2.5 10*3/uL (ref 0.7–4.0)
MCH: 26 pg (ref 26.0–34.0)
MCHC: 30.9 g/dL (ref 30.0–36.0)
MCV: 84.1 fL (ref 80.0–100.0)
Monocytes Absolute: 0.9 10*3/uL (ref 0.1–1.0)
Monocytes Relative: 11 %
Neutro Abs: 4.7 10*3/uL (ref 1.7–7.7)
Neutrophils Relative %: 57 %
Platelets: 439 10*3/uL — ABNORMAL HIGH (ref 150–400)
RBC: 3.96 MIL/uL (ref 3.87–5.11)
RDW: 12.5 % (ref 11.5–15.5)
WBC: 8.3 10*3/uL (ref 4.0–10.5)
nRBC: 0 % (ref 0.0–0.2)

## 2019-07-24 LAB — BASIC METABOLIC PANEL
Anion gap: 12 (ref 5–15)
BUN: 8 mg/dL (ref 6–20)
CO2: 24 mmol/L (ref 22–32)
Calcium: 9.2 mg/dL (ref 8.9–10.3)
Chloride: 102 mmol/L (ref 98–111)
Creatinine, Ser: 0.85 mg/dL (ref 0.44–1.00)
GFR calc Af Amer: >60 mL/min (ref >60)
GFR calc non Af Amer: >60 mL/min (ref >60)
Glucose, Bld: 108 mg/dL — ABNORMAL HIGH (ref 70–99)
Potassium: 3.4 mmol/L — ABNORMAL LOW (ref 3.5–5.1)
Sodium: 138 mmol/L (ref 135–145)

## 2019-07-24 NOTE — Progress Notes (Signed)
°PROGRESS NOTE ° °Salma Uddin MRN:[id number] DOB: 07/04/1991 DOA: 07/01/2019 °PCP: Patient, No Pcp Per ° °Brief History   °[name] is a 28 y.o. female with medical history significant of asthma who presented to the ED on 07/01/2019 for evaluation of suicidal ideation, paranoia, and bizarre behavior.  Patient had recent ED visit 2 days ago for similar behavior.  Per police report, she was found on the balcony of her apartment throwing things and saying she was suicidal and was going to jump.  On initial labs, no leukocytosis, blood ethanol level negative, acetaminophen level negative, salicylate level negative.  Transaminases elevated (AST 76, ALT 145), remainder of LFTs normal. Beta-hCG negative.  COVID 19 rapid test negative.  TSH normal.  UDS positive for benzodiazepines.  Labs done on 07/03/2019 showing CK 2998 and has continued to worsen since then.  On 8/13, CK 4536, blood glucose 63.  Blood glucose subsequently improved.  Today, CK uptrending 4011 > 4037.  Transaminases improved (AST 70, ALT 90).  Head CT done today showing no acute intracranial abnormality.  Patient has received a total of 6 L IV fluid boluses in the past 2 days.  However, CK continue's to trend up and patient is persistently tachycardic with heart rate up to 130s.  She has continued to be highly agitated throughout her ED stay for the past 4 days.Trying to pull out IV lines etc.  Refusing p.o.'s.  Patient has even managed to get out of soft restraints and has required security assistance to get her back to her room. Required multiple rounds of antipsychotics, benzodiazepines, diphenhydramine etc. for the past 4 days.  Due to CK uptrending, psychiatry recommended stopping antipsychotics/ Zyprexa as they may be causing rhabdo.  Accepted by inpatient psychiatry, but patient not taking PO. Mother refusing enteral feeds.   Internal medicine teaching service was called and declined hospital admission.  [facility] to admit patient for mgmt  of rhabdomyolysis, persistent tachycardia, agitation/paranoia/hallucinations. °  °On the evening of 07/09/2019 the patient had a rapid response called due to fever and tachycardia. She was moved to a telemetry floor. Mother is at bedside today and states that the patient was awake, but not herself earlier today. EEG negative for seizure.  ° °Neurology was consulted. MRI was negative. LP was performed under fluoro on 07/13/2019. It was negative for infection. Autoimmune work up is pending, and the patient is now undergoing a long term EEG. Autoimmune studies on CSF is pending.  ° °The patient's mother has refused feeding tube placement or artificial nutrition. SLP has been asked to re-evaluate the patient. They are unable to evaluate the patient, because the patient will not cooperate with them. The patient remains NPO. On 07/16/2019 the patient's mother also stated that she did not wish for the patient to receive any more ativan. The patient's mother wishes for the patient to be discharged to the [facility] in [place]. They have declined to accept the patient, because she is not taking PO. I have discussed the patient with [name] today at the request of the patient's mother regarding transfer. I spoke with a [name]. I explained to [name] the patient's presentation and ongoing mental status. I have explained to him the patient's work up and the recommendations of psychiatry and neurology (ativan protocol and ECT). I have explained to him that the patient has taken nothing by mouth for almost 2 weeks. I have also explained to him that the patient's mother has refused all of these interventions. [name] said that he did not feel that [facility] had anything to add to the patient's work up and that simply transferring the patient for the family's convenience was not a consideration in the   current environment when they are at 120% of capacity.   I have consulted Dr. Phillips Odor to  help up determine the goals of care with the mother. I will also discuss with case management discharge to another LTAC of rehab facility. I have attempted to reach the patient's mother this afternoon to explain to her the response of [facility].   Consultants   Neurology  Psychiatry  Procedures   EEG  LP  Antibiotics   Anti-infectives (From admission, onward)   Start     Dose/Rate Route Frequency Ordered Stop   07/09/19 1900  Ampicillin-Sulbactam (UNASYN) 3 g in sodium chloride 0.9 % 100 mL IVPB     3 g 200 mL/hr over 30 Minutes Intravenous Every 8 hours 07/09/19 1845 07/16/19 1904     Subjective  The patient the patient is sitting up in a chair with her eyes closed. Mother is at her side and relates to me how her daughter has told her that she believed that no one has ever told her that is was important for her to eat. Again the patient says nothing in my presence.  Objective   Vitals:  Vitals:   07/24/19 1339 07/24/19 1718  BP: 119/67 138/81  Pulse: 85 (!) 101  Resp: 19 19  Temp: 98.5 F (36.9 C) 98.5 F (36.9 C)  SpO2: 99% 100%    Exam:  Constitutional: The patient is lying with her eyes closed this morning. She remains non-verbal and infantile in her mannerisms. Respiratory:   No increased work of breathing  No wheezes, rales, or rhonchi.  No tactile fremitus Cardiovascular:   Tachycardic at greater than 100 BPM.  No murmurs, ectopy or gallups.  No lateral PMI. No thrills. Abdomen:   Abdomen is soft, non-tender, non-distended.  No hernias, masses, or organomegaly  Normoactive bowel sounds. Musculoskeletal:   No cyanosis clubbing or edema. Skin:   No rashes, lesions, ulcers  palpation of skin: no induration or nodules Neurologic:   Patient is unable to cooperate with exam. Psychiatric:  Patient is unable to cooperate with exam.  I have personally reviewed the following:   Today's Data   Vitals, BMP, CBC  Other Data   EEG  negative.  Scheduled Meds:  enoxaparin (LOVENOX) injection  40 mg Subcutaneous Q24H   ferrous sulfate  325 mg Oral Q breakfast   folic acid  1 mg Oral Daily   megestrol  400 mg Oral Daily   metoprolol tartrate  7.5 mg Intravenous Q6H   nicotine  21 mg Transdermal Q0600   sodium chloride flush  10-40 mL Intracatheter Q12H   thiamine  100 mg Oral Daily   Or   thiamine  100 mg Intravenous Daily   Continuous Infusions:  dextrose 5 % and 0.45% NaCl 150 mL/hr at 07/24/19 1428    Principal Problem:   Altered mental status Active Problems:   Rhabdomyolysis   Sinus tachycardia   Suicidal ideation   Psychosis (HCC)   LOS: 18 days   A & P  Decreased level of consciousness/agitation:  Low grade fever yesterday. Antipsychotics stopped. No infectious etiology has been determined. Neurology ordered MRI which was unremarkable. LP was negative for infectious causes. No nuchal rigidity/meningeal signs to suggest meningitis. Autoimmune work up on CSF is pending. Neurology has had the patient on ativan 1 mg qid. The patient's mother has stated that she does not want her daughter to receive this medication. I have discontinued it. I have discussed the patient with Dr. Sharma Covert. She  has evaluated the patient. I appreciate her assistance. Both neurology and psychiatry have recommended Ativan protocol, but the mother is refusing this. They have both also recommended ECT, but the mother has refused this as well. The patient's mother wishes for the patient to be discharged to the [facility] in [place]. They have declined to accept the patient, because she is not taking PO. I have discussed the patient with [facility] today at the request of the patient's mother regarding transfer. I spoke with a Dr. Amil Amen. I explained to Dr. Amil Amen the patient's presentation and ongoing mental status. I have explained to him the patient's work up and the recommendations of psychiatry  and neurology (ativan protocol and ECT). I have explained to him that the patient has taken nothing by mouth for almost 2 weeks. I have also explained to him that the patient's mother has refused all of these interventions. Dr. Amil Amen said that he did not feel that [facility] had anything to add to the patient's work up and that simply transferring the patient for the family's convenience was not a consideration in the current environment when they are at 120% of capacity. I have consulted [name] to help up determine the goals of care with the mother. I will also discuss with case management discharge to another LTAC of rehab facility. I have explained to the mother my interaction with [facility]. I have also consulted Dr. Rhea Pink to assist in determinine goals of care.  Rhabdomyolysis: Ongoing. Continue IV fluids. Likely due to agitation. No seizure activitiy on EEG. Mother refusing ativan.  Anorexia: The patient's mother has refused feeding tube placement or artificial nutrition. SLP has been asked to re-evaluate the patient. They are unable to evaluate the patient, because the patient will not cooperate with them. The patient remains NPO. I have ordered megace at the mother's request, but I doubt that the patient will take it. Mother is refusing insertion or use of feeding tube. I have emphasized to her the important of returning nutrition to the fluid and the fact that the patient would not have survived to this point without IV fluids with D5. She says that she will make a decision by Sunday. Must consider ethics consult if either patient does not start eating soon, or the patient's mother does not begin to allow tube feeds.  Hypernatremia/Hyperchloremia: Resolved with increased Iv fluid rate. Pt is taking no PO. Monitor. She is receiving D5 1/2 NS. Will follow electrolytes including magnesium and phosphorus, and supplement as necessary.  Hypokalemia: 3.4 this morning. Supplement and  monitor.  Abnormal LFTs: Likely related to rhabdomyolysis. Avoid hepatotoxic medications. No abdominal pain on examination. In the setting of rhabdomyolysis next-avoid hepatotoxic medications. Monitor and trend.  Persistent Sinus Tachycardia: Monitor on telemetry. Echocardiogram showed Normal EF. Metoprolol was increased, but the patient is refusing PO. She has been started on IV lopressor.Cardiology was consulted and Dr. Nechama Guard feels that her persistent rates within the 100s 120s are is likely secondary to rhabdo and agitation psych aortic issues.  There is no structural heart disease on TTE and likely the treatment will be addressing underlying cause and no further cardiac work-up is recommended at this time. TSH within normal limits.  Low grade fevers with sinus tachycardia: Resolved. Infectious cause? UA negative and urine cultures are negative thus far. Blood cultures negative CXR demonstrated only atelectasis. Consider possible withdrawal from substance? UDS positive only for benzoes. Dr. Lorraine Lax from neurology is consulting. I appreciate his help.  Suicidal Ideation, Agitation/Psychosis: The patient has been evaluated by psychiatry.   Suicide precautions, sitter at bedside. Continue to Monitor. Continue 1:1 sitter.   Renal insufficiency/AKI, worsening slowly: Due to rhabdomyolysis and urinary retention and patient not accepting PO. Will increase IV fluids. Avoid nephrotoxic substances and hypotension. Monitor. 0.79. Today.  Renal ultrasound is negative. Foley catheter is in place.  Hyperphosphatemia: Due to AKI and poor hydration. IV fluids increased. Monitor.  Normocytic Anemia: Hemoglobin stable for last couple of days at 11.3 and 11.0. Monitor.Likely dilutional drop in the setting of IV fluid resuscitation. Checked anemia panel and patient's iron level was 25, U IBC was 304, TIBC was 329, saturation ratios were 8%, ferritin was 66, folate level was 17.9, and vitamin B12 level was 1033.  Monitor.  Obesity:  Estimated body mass index is 36.31 kg/m as calculated from the following. Once recovered the patient should seek assistance from per PCP for a sensible weight loss plan.  The patient was seen and examined by me. I have spent 34 minutes in her evaluation and care.  DVT prophylaxis: Enoxaparin 40 mg sq Daily  Code Status: FULL CODE  Family Communication: None available today. Disposition Plan: Inpatient psychiatry vs placement in rehab facility.   Keny Donald, DO [facility] Direct contact: see [email or web address]  7PM-7AM contact night coverage as above 07/23/2019, 4:57 PM  LOS: 4 days

## 2019-07-24 NOTE — Progress Notes (Signed)
Occupational Therapy Treatment Patient Details Name: Veronica Arias MRN: [id number] DOB: Sep 18, 1991 Today's Date: 07/24/2019    History of present illness 28 y.o. female with medical history significant of asthma who presented to the ED on 07/01/2019 for evaluation of suicidal ideation, paranoia, and bizarre behavior. 07/09/19 rapid response called for fever and tachycardia, developed rhabdomyolysis.    OT comments  Pt progressing toward established OT goals. Upon arrival pt sitting EOB with nsg present and her mother present. Pt appeared agitated stating "everyone get out of my face", OT/PT able to calm pt and engage pt in participation. Pt required maxA+2 for short ambulation (~434feet) with increasing posterior lean, requiring sitter to bring chair behind pt. Pt demonstrated increased purposeful use of familiar objects, donning glasses after setupA without vc for initiation, but still demonstrates limitations donning her sock on her hand.  Pt drank Ensure drink (appeared to be 4 swallows). Pt will continue to benefit from skilled OT services to maximize safety and independence with ADL/IADL and functional mobility. Will continue to follow acutely and progress as tolerated.    Follow Up Recommendations  Supervision/Assistance - 24 hour    Equipment Recommendations  3 in 1 bedside commode;Wheelchair (measurements OT);Wheelchair cushion (measurements OT);Hospital bed    Recommendations for Other Services      Precautions / Restrictions Precautions Precautions: Fall Restrictions Weight Bearing Restrictions: No       Mobility Bed Mobility               General bed mobility comments: pt sitting EOB upon arrival  Transfers Overall transfer level: Needs assistance Equipment used: 2 person hand held assist Transfers: Sit to/from Stand;Stand Pivot Transfers Sit to Stand: +2 physical assistance;Max assist Stand pivot transfers: +2 physical assistance;Max assist            Balance  Overall balance assessment: Needs assistance Sitting-balance support: Single extremity supported;Feet supported Sitting balance-Leahy Scale: Poor   Postural control: Posterior lean;Left lateral lean Standing balance support: Bilateral upper extremity supported Standing balance-Leahy Scale: Zero Standing balance comment: stood side by side with pt, ambulated to the windows with increasing posterior lean despite vc to stand upright;                           ADL either performed or assessed with clinical judgement   ADL Overall ADL's : Needs assistance/impaired Eating/Feeding: Maximal assistance Eating/Feeding Details (indicate cue type and reason): pt drank 4 swallows from ensure out of a straw, pt's mom held drink  Grooming: Set up(donning glasses) Grooming Details (indicate cue type and reason): therapist handed glasses to pt who donned them without cueing or physical assistance             Lower Body Dressing: Total assistance Lower Body Dressing Details (indicate cue type and reason): totalA to don sock;when prompted to don sock pt donned sock over hand Toilet Transfer: Moderate assistance;Maximal assistance;+2 for physical assistance   Toileting- Clothing Manipulation and Hygiene: Total assistance       Functional mobility during ADLs: Maximal assistance;+2 for physical assistance(+3 for chair follow)       Vision       Perception     Praxis      Cognition Arousal/Alertness: Awake/Arias Behavior During Therapy: Anxious;Restless;Flat affect Overall Cognitive Status: Impaired/Different from baseline Area of Impairment: Attention;Memory;Orientation;Following commands;Safety/judgement;Awareness;Problem solving                 Orientation Level: Disoriented to;Person;Place;Time;Situation Current Attention  Level: Focused Memory: Decreased recall of precautions;Decreased short-term memory Following Commands: Follows one step commands  inconsistently;Follows one step commands with increased time Safety/Judgement: Decreased awareness of safety;Decreased awareness of deficits Awareness: Intellectual Problem Solving: Slow processing;Difficulty sequencing;Requires verbal cues;Requires tactile cues;Decreased initiation General Comments: Pt agitated upon arrival;pt able to don her glasses when handed to her without prompting;pt initiated nonpurposeful typing on keyboard and use of mouse;pt more communicative this date, reports she wants to call someone she is familiar with; frequently stating "I want to go home to my mom's house",pt appeared to not recognize her mother at times stating "I don't know anyone" with mother standing in front of her;pt attempting to full of leads and pull out catheter;use of "spa" music in attempt to calm pt, played music in the background, pt appeared to calm down and sit back in the recliner without attempting to get out, pt also took sips from ensure (appeared to have 4 swallows)prompted pt to don sock, pt donned sock on her hand        Exercises     Shoulder Instructions       General Comments mother present during session;educated her on bringing in familiar items (pictures, pillows, etc) for pt     Pertinent Vitals/ Pain       Pain Assessment: Faces Faces Pain Scale: Hurts a little bit Pain Location: generalized with movement Pain Descriptors / Indicators: Grimacing;Guarding Pain Intervention(s): Limited activity within patient's tolerance;Monitored during session  Home Living                                          Prior Functioning/Environment              Frequency  Min 2X/week        Progress Toward Goals  OT Goals(current goals can now be found in the care plan section)  Progress towards OT goals: Progressing toward goals  Acute Rehab OT Goals Patient Stated Goal: to talk to someone familiar OT Goal Formulation: With patient Time For Goal  Achievement: 08/02/19 Potential to Achieve Goals: Fair ADL Goals Pt Will Perform Eating: with mod assist;sitting Pt Will Perform Grooming: with mod assist;sitting Pt Will Perform Upper Body Dressing: with mod assist;sitting Pt Will Transfer to Toilet: with +2 assist;with mod assist;bedside commode;stand pivot transfer Pt/caregiver will Perform Home Exercise Program: Increased strength;Both right and left upper extremity;With minimal assist Additional ADL Goal #1: Pt will demonstrate fair sitting balance as a precursor to ADL. Additional ADL Goal #2: Pt will perform bed mobility with moderate assistance in preparation for ADL.  Plan Discharge plan remains appropriate    Co-evaluation    PT/OT/SLP Co-Evaluation/Treatment: Yes Reason for Co-Treatment: Complexity of the patient's impairments (multi-system involvement);Necessary to address cognition/behavior during functional activity;For patient/therapist safety;To address functional/ADL transfers   OT goals addressed during session: ADL's and self-care      AM-PAC OT "6 Clicks" Daily Activity     Outcome Measure   Help from another person eating meals?: Total Help from another person taking care of personal grooming?: A Lot Help from another person toileting, which includes using toliet, bedpan, or urinal?: A Lot Help from another person bathing (including washing, rinsing, drying)?: Total Help from another person to put on and taking off regular upper body clothing?: Total Help from another person to put on and taking off regular lower body clothing?: Total 6 Click Score:  8    End of Session Equipment Utilized During Treatment: Gait belt  OT Visit Diagnosis: Unsteadiness on feet (R26.81);Pain;Muscle weakness (generalized) (M62.81);Other symptoms and signs involving cognitive function   Activity Tolerance Patient tolerated treatment well   Patient Left in chair;with nursing/sitter in room;with call bell/phone within reach    Nurse Communication Mobility status        Time: 3646-8032 OT Time Calculation (min): 35 min  Charges: OT General Charges $OT Visit: 1 Visit OT Treatments $Self Care/Home Management : 8-22 mins  Diona Browner OTR/L Acute Rehabilitation Services Office: [phone]    Veronica Arias 07/24/2019, 2:17 PM

## 2019-07-24 NOTE — Progress Notes (Signed)
Physical Therapy Treatment Patient Details Name: Veronica BaileyKyla Arias MRN: [id number] DOB: Jul 18, 1991 Today's Date: 07/24/2019    History of Present Illness 28 y.o. female with medical history significant of asthma who presented to the ED on 07/01/2019 for evaluation of suicidal ideation, paranoia, and bizarre behavior. 07/09/19 rapid response called for fever and tachycardia, developed rhabdomyolysis.     PT Comments    Pt continues to make progress towards her goals. Today she was able to correctly use items like glasses, computer mouse, cell phone and socks, although she put one sock on her hand. Pt is more irritated to day and needs constant redirection away from lines and leads. Pt is responding to mother and photos of family on cell phone. At end of session pt mother able to get pt to drink a few swallows of Ensure. PT continues to recommend [facility] if possible as pt is approaching purposeful eating. PT will continue to follow acutely.   Follow Up Recommendations  Other (comment)([facility] )     Equipment Recommendations  None recommended by PT       Precautions / Restrictions Precautions Precautions: Fall Restrictions Weight Bearing Restrictions: No    Mobility  Bed Mobility               General bed mobility comments: pt sitting EOB upon arrival  Transfers Overall transfer level: Needs assistance Equipment used: 2 person hand held assist Transfers: Sit to/from Stand;Stand Pivot Transfers Sit to Stand: +2 physical assistance;Max assist Stand pivot transfers: +2 physical assistance;Max assist       General transfer comment: pt with good initiation of power up but requires maxAx2 for steadying once in standing, strong posterior lean  Ambulation/Gait Ambulation/Gait assistance: Max assist;+2 physical assistance Gait Distance (Feet): 8 Feet Assistive device: 2 person hand held assist Gait Pattern/deviations: Step-through pattern;Decreased step length -  right;Decreased step length - left;Ataxic;Leaning posteriorly Gait velocity: slowed Gait velocity interpretation: <1.31 ft/sec, indicative of household ambulator General Gait Details: maxAx2 for steadying with ataxic stepping forward, increased posterior lean, and increased irritation with distance, requires chair be brought behind her         Balance Overall balance assessment: Needs assistance Sitting-balance support: Single extremity supported;Feet supported Sitting balance-Leahy Scale: Poor   Postural control: Posterior lean;Left lateral lean Standing balance support: Bilateral upper extremity supported Standing balance-Leahy Scale: Zero Standing balance comment: stood side by side with pt, ambulated to the windows with increasing posterior lean despite vc to stand upright;                            Cognition Arousal/Alertness: Awake/alert Behavior During Therapy: Anxious;Restless;Flat affect Overall Cognitive Status: Impaired/Different from baseline Area of Impairment: Attention;Memory;Orientation;Following commands;Safety/judgement;Awareness;Problem solving                 Orientation Level: Disoriented to;Person;Place;Time;Situation Current Attention Level: Focused Memory: Decreased recall of precautions;Decreased short-term memory Following Commands: Follows one step commands inconsistently;Follows one step commands with increased time Safety/Judgement: Decreased awareness of safety;Decreased awareness of deficits Awareness: Intellectual Problem Solving: Slow processing;Difficulty sequencing;Requires verbal cues;Requires tactile cues;Decreased initiation General Comments: Pt agitated upon arrival;pt able to don her glasses when handed to her without prompting;pt initiated nonpurposeful typing on keyboard and use of mouse;pt more communicative this date, reports she wants to call someone she is familiar with; frequently stating "I want to go home to my mom's  house",pt appeared to not recognize her mother at times stating "I  don't know anyone" with mother standing in front of her;pt attempting to full of leads and pull out catheter;use of "spa" music in attempt to calm pt, played music in the background, pt appeared to calm down and sit back in the recliner without attempting to get out, pt also took sips from ensure (appeared to have 4 swallows)prompted pt to don sock, pt donned sock on her hand         General Comments General comments (skin integrity, edema, etc.): mother present during session;educated her on bringing in familiar items (pictures, pillows, etc) for pt       Pertinent Vitals/Pain Pain Assessment: Faces Faces Pain Scale: Hurts a little bit Pain Location: generalized with movement Pain Descriptors / Indicators: Grimacing;Guarding Pain Intervention(s): Limited activity within patient's tolerance;Monitored during session;Repositioned           PT Goals (current goals can now be found in the care plan section) Acute Rehab PT Goals Patient Stated Goal: to talk to someone familiar PT Goal Formulation: Patient unable to participate in goal setting Time For Goal Achievement: 08/02/19 Potential to Achieve Goals: Fair Progress towards PT goals: Progressing toward goals    Frequency    Min 2X/week      PT Plan Current plan remains appropriate    Co-evaluation PT/OT/SLP Co-Evaluation/Treatment: Yes Reason for Co-Treatment: Complexity of the patient's impairments (multi-system involvement) PT goals addressed during session: Mobility/safety with mobility;Balance OT goals addressed during session: ADL's and self-care      AM-PAC PT "6 Clicks" Mobility   Outcome Measure  Help needed turning from your back to your side while in a flat bed without using bedrails?: Total Help needed moving from lying on your back to sitting on the side of a flat bed without using bedrails?: Total Help needed moving to and from a bed to a  chair (including a wheelchair)?: Total Help needed standing up from a chair using your arms (e.g., wheelchair or bedside chair)?: Total Help needed to walk in hospital room?: Total Help needed climbing 3-5 steps with a railing? : Total 6 Click Score: 6    End of Session Equipment Utilized During Treatment: Gait belt Activity Tolerance: Patient tolerated treatment well Patient left: in chair;with call bell/phone within reach;with nursing/sitter in room Nurse Communication: Mobility status PT Visit Diagnosis: Unsteadiness on feet (R26.81);Other abnormalities of gait and mobility (R26.89);Muscle weakness (generalized) (M62.81);Difficulty in walking, not elsewhere classified (R26.2);Other symptoms and signs involving the nervous system (Y30.160)     Time: 1093-2355 PT Time Calculation (min) (ACUTE ONLY): 31 min  Charges:  $Gait Training: 8-22 mins                     Weyman Bogdon B. Migdalia Dk PT, DPT Acute Rehabilitation Services Pager [phone] Office [phone]    [place] 07/24/2019, 2:43 PM

## 2019-07-24 NOTE — Progress Notes (Signed)
Pt began getting agitated and trying to pull out foley.Tried music and reassuring her, none of these measures worked for only a few mins Placed mitts on hands but, pt was still able to pull on foley and began kicking and cursing. Gave PRN Ativan 0.5 mg IV. Pt calmed down. Will continue to monitor

## 2019-07-25 DIAGNOSIS — R4182 Altered mental status, unspecified: Secondary | ICD-10-CM

## 2019-07-25 LAB — GLUCOSE, CAPILLARY
Glucose-Capillary: 107 mg/dL — ABNORMAL HIGH (ref 70–99)
Glucose-Capillary: 107 mg/dL — ABNORMAL HIGH (ref 70–99)
Glucose-Capillary: 120 mg/dL — ABNORMAL HIGH (ref 70–99)
Glucose-Capillary: 108 mg/dL — ABNORMAL HIGH (ref 70–99)
Glucose-Capillary: 106 mg/dL — ABNORMAL HIGH (ref 70–99)
Glucose-Capillary: 91 mg/dL (ref 70–99)

## 2019-07-25 LAB — CREATININE, SERUM
Creatinine, Ser: 0.72 mg/dL (ref 0.44–1.00)
GFR calc Af Amer: >60 mL/min (ref >60)
GFR calc non Af Amer: >60 mL/min (ref >60)

## 2019-07-25 MED ORDER — NYSTATIN 100000 UNIT/ML MT SUSP
5.0000 mL | Freq: Four times a day (QID) | OROMUCOSAL | Status: DC
  Administered 2019-07-25 – 2019-08-05 (×28): 500000 [IU] via ORAL
  Filled 2019-07-25 (×33): qty 5

## 2019-07-25 MED ORDER — LORAZEPAM 2 MG/ML IJ SOLN
1.0000 mg | Freq: Every day | INTRAMUSCULAR | Status: DC
  Administered 2019-07-26 – 2019-07-31 (×7): 1 mg via INTRAVENOUS
  Filled 2019-07-25 (×7): qty 1

## 2019-07-25 NOTE — Progress Notes (Signed)
  Speech Language Pathology Treatment: Dysphagia  Patient Details Name: Veronica Arias MRN: [id number] DOB: 09/07/1991 Today's Date: 07/25/2019 Time: 3382-5053 SLP Time Calculation (min) (ACUTE ONLY): 18 min  Assessment / Plan / Recommendation Clinical Impression  Pt seen with mother at bedside for acceptance of puree texture, thin liquids. [place] noted to have spastic movements of leg (raising in the air intermittently). Grimace and crying (no phonation) when head of bed raised. Spoke with Palliative care MD wh observed pt to have lingual candidias that may cause discomfort if suspected in pharynx as well. Fluctuated from open mouth posture to closed lips and anterior leakage of saliva possibly after ice cream touched lips tongue. Therapist mixed ice cream with small amount of vanilla Ensure to a more softer and warmer consistency. Pt held in oral cavity without attempts to manipulate and was eventually suctioned. Mom reported pt drank approximately 2 oz of Ensure yesterday via straw. Recommend medical management for candidias and mom to continue offering po's throughout the day. Pt more responsive when moving with PT/OT per mom- SLP will attempt a co-treat for increased response. If no success ST will likely sign off as decreased intake likely psychological and not much for ST to offer.    HPI HPI: 28 year old female who presented to the ED on 8/10 with acute onset of encephalopathy and suicidal ideation. She was found to have rhabdomyolysis as well as persistent tachycardia of unclear etiology. She has continued to deteriorate since admission, experiencing fever, diaphoresis, and catatonia.  Thus far all neurological tests are negative. DDx includes autoimmune encephalitis and resolving NMS. Pt has been evaluated by psychiatry; they plan to admit to inpt psych after medically stable.       SLP Plan  Continue with current plan of care       Recommendations  Diet recommendations: Thin  liquid;Dysphagia 1 (puree) Liquids provided via: Cup;Straw Medication Administration: Via alternative means Supervision: Staff to assist with self feeding;Full supervision/cueing for compensatory strategies Compensations: Minimize environmental distractions Postural Changes and/or Swallow Maneuvers: Seated upright 90 degrees                Oral Care Recommendations: Oral care QID Follow up Recommendations: 24 hour supervision/assistance SLP Visit Diagnosis: Dysphagia, unspecified (R13.10) Plan: Continue with current plan of care       [place], Kyle Luppino Willis 07/25/2019, 6:05 PM

## 2019-07-25 NOTE — TOC Progression Note (Signed)
Transition of Care [facility]) - Progression Note    Patient Details  Name: Veronica Arias MRN: [id number] Date of Birth: [birth year]  Transition of [facility] CM/SW Contact  Graves-Bigelow, Ocie Cornfield, RN Phone Number: 07/25/2019, 3:40 PM  Clinical Narrative:  CM did reach out to Medical Director regarding the case to have additional insight regarding case and the progression of the patient's care.  Medical Director [name], stated he will discuss case with MD [facility] and he will try to attend rounds with MD on 07-26-19. CM will continue to monitor for additional transition of care needs.   Expected Discharge Plan: IP Rehab Facility Barriers to Discharge: Continued Medical Work up(Referral sent to [facility])  Expected Discharge Plan and Services Expected Discharge Plan: [place]   Discharge Planning Services: CM Consult Post Acute Care Choice: NA Living arrangements for the past 2 months: Apartment                    Social Determinants of Health (SDOH) Interventions    Readmission Risk Interventions No flowsheet data found.

## 2019-07-25 NOTE — Consult Note (Signed)
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 On 8/8 EMS was called by family, they administered 18m of IM haldol and versed upon arrival in order to treat the patient's severe agitated state and transport her to the hospital. [facility]iazepam which greatly improved her symptoms to the degree she was able to provide coherent history and was deemed stable enough for discharge home with outpatient follow up. At that time her condition was thought to be an acute stress reaction. On 8/10 2 days later she again decompensated and was brought in by police and EMS requiring 4 pt restraints- agitated, psychotic and with aggressive combative behavior. She was given IM geodon and sedated.  Over the course of three days in the ED she required multiple dose of antipsychotic medications and labs suggested AKI with elevated CK and Rhabdomyolysis-felt to be secondary to medications.Additionally she was significantly tachycardic at rest. Medical admission occurred on 8/14 for treatment of rhabdomyolysis. Extensive medical work up has been done to rule out neurologic, metabolic or autoimmune reasons for her very atypical presentation. 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  Patient participated in "excessive gaming", would often stay awake for days per  mothers report.   There is a strong family history of psychosis, the patient's first cousin (patient's mother's sister's daughter) had similar "break" when she was 26years old [facility].   Patient successful in school but per her mother's report was not socially engaged with others even in childhood. Only close friendships with cousins and family.   Assessment:  KNaidelynappears to have deteriorated significantly since her initial presentation with acute psychotic symptoms almost a month ago. She may be now progressing towards life threatening complications of what appears to be agitated catatonia. She has not had significant PO intake in over 10 days, she has evidence of possible aspiration, she has skin issues from immobility, may also be developing some early extremity contractures. She has had multiple and many doses of Ativan and has not had dramatic or sustained improvement in her catonia, but I suspect there was some early benefit but she quickly relapsed. 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  I met with her mother Veronica Kinlawtoday at the patient's bedside, I was in the room for over 45 minutes during which time I also observed patient's behavior. On my exam today she demonstrates spasticity, mutism, bizarre facial expressions that look like "fear" or "terror", grimacing, unusual tongue movements, erratic eye movements. She resisted me examining her eyes with a light, her mouth had thrush on buccal and tongue surface, she has a generalized tremor, no purposeful movement, does not follow commands, she had significant drooling. Her skin was very warm to touch,  she was diaphoretic and tachycardic. Her urine was very clear in foley bag. No abdominal distention.   Summary of Goals of Care:   I had extensive  conversation with Veronica Arias's mother regarding her condition. Her mother has a very poor understanding of the medical decisions she is being asked to make and their implications for her daughter care. There have been concerns expressed by team/providers that the mother may not be making medical decisions in the best interest of her daughter- I did not find this to be true upon meeting with her but rather she does not understand what she is being asked and the potential implications. For example, when I explained that the feeding tube was temporary and low risk she seemed reassured and open to at least a trial of feeding as a supportive measure while we get Veronica Arias the psychiatric care that she needs urgently. Her concern was very legitimate for not wanting the feeding tube-she was worried that Veronica Arias would just pull the tube out and she did not want her to have to be restrained again. She acknowledged that Veronica Arias needed nutrition and that this was an important temporary supportive measure. She also was unclear about the Ativan -I don't think she is refusing an ativan protocol but what she has understood from other provider communication is that the benzos are not helping. While she is fearful of ECT she is not opposed to this if it will help Ica.    Prior to our conversation, Veronica Arias mother did not realize the very serious almost life threatening condition her daughter could be facing.   A major goal for Veronica Arias mother is to have Veronica Arias transferred to [facility] -to either [facility] or [facility] which are closer to her home -she is exhausted from traveling to [place] daily. Additionally the patient's cousin was treated for a similar condition at the [facility]. Her mother expressed appreciation for the care Veronica Arias has received at [facility] but feels "we have done all we can here" and she wants her moved asap so she can get better.   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 She needs daily in person/team psychiatric specialty care.    Assess effectiveness of communication with her mother to ensure shared decision making is occurring- address both informational and emotional needs.   I will continue to follow as needed and provide supportive care.   Veronica Arias is going to discuss feeding tube with family tonight-she feels better about the concept of a trial of cortrak and TF.   She needs more information and discussion about Catatonia, Ativan protocol and ECT options.  Veronica Hacker, DO Palliative Medicine [phone]  Time: 70 min Greater than 50%  of this time was spent counseling and coordinating care related to the above assessment and plan.

## 2019-07-25 NOTE — Progress Notes (Signed)
°PROGRESS NOTE ° °Veronica Arias MRN:[id number] DOB: 07/04/1991 DOA: 07/01/2019 °PCP: Patient, No Pcp Per ° °Brief History   °Veronica Arias is a 28 y.o. female with medical history significant of asthma who presented to the ED on 07/01/2019 for evaluation of suicidal ideation, paranoia, and bizarre behavior.  Patient had recent ED visit 2 days ago for similar behavior.  Per police report, she was found on the balcony of her apartment throwing things and saying she was suicidal and was going to jump.  On initial labs, no leukocytosis, blood ethanol level negative, acetaminophen level negative, salicylate level negative.  Transaminases elevated (AST 76, ALT 145), remainder of LFTs normal. Beta-hCG negative.  COVID 19 rapid test negative.  TSH normal.  UDS positive for benzodiazepines.  Labs done on 07/03/2019 showing CK 2998 and has continued to worsen since then.  On 8/13, CK 4536, blood glucose 63.  Blood glucose subsequently improved.  Today, CK uptrending 4011 > 4037.  Transaminases improved (AST 70, ALT 90).  Head CT done today showing no acute intracranial abnormality.  Patient has received a total of 6 L IV fluid boluses in the past 2 days.  However, CK continue's to trend up and patient is persistently tachycardic with heart rate up to 130s.  She has continued to be highly agitated throughout her ED stay for the past 4 days.Trying to pull out IV lines etc.  Refusing p.o.'s.  Patient has even managed to get out of soft restraints and has required security assistance to get her back to her room. Required multiple rounds of antipsychotics, benzodiazepines, diphenhydramine etc. for the past 4 days.  Due to CK uptrending, psychiatry recommended stopping antipsychotics/ Zyprexa as they may be causing rhabdo.  Accepted by inpatient psychiatry, but patient not taking PO. Mother refusing enteral feeds.   Internal medicine teaching service was called and declined hospital admission.  [facility] to admit patient for mgmt  of rhabdomyolysis, persistent tachycardia, agitation/paranoia/hallucinations. °  °On the evening of 07/09/2019 the patient had a rapid response called due to fever and tachycardia. She was moved to a telemetry floor. Mother is at bedside today and states that the patient was awake, but not herself earlier today. EEG negative for seizure.  ° °Neurology was consulted. MRI was negative. LP was performed under fluoro on 07/13/2019. It was negative for infection. Autoimmune work up is pending, and the patient is now undergoing a long term EEG. Autoimmune studies on CSF is pending.  ° °The patient's mother has refused feeding tube placement or artificial nutrition. SLP has been asked to re-evaluate the patient. They are unable to evaluate the patient, because the patient will not cooperate with them. The patient remains NPO. On 07/16/2019 the patient's mother also stated that she did not wish for the patient to receive any more ativan. The patient's mother wishes for the patient to be discharged to the [facility] in [place]. They have declined to accept the patient, because she is not taking PO. I have discussed the patient with [name] today at the request of the patient's mother regarding transfer. I spoke with a [name]. I explained to [name] the patient's presentation and ongoing mental status. I have explained to him the patient's work up and the recommendations of psychiatry and neurology (ativan protocol and ECT). I have explained to him that the patient has taken nothing by mouth for almost 2 weeks. I have also explained to him that the patient's mother has refused all of these interventions. [name] said that he did not feel that [facility] had anything to add to the patient's work up and that simply transferring the patient for the family's convenience was not a consideration in the   current environment when they are at 120% of capacity.   I have consulted Dr. Phillips Odor to  help up determine the goals of care with the mother. I will also discuss with case management discharge to another LTAC of rehab facility. I have attempted to reach the patient's mother this afternoon to explain to her the response of [facility].   Consultants   Neurology  Psychiatry  Palliative care.  Procedures   EEG  LP  Antibiotics   Anti-infectives (From admission, onward)   Start     Dose/Rate Route Frequency Ordered Stop   07/09/19 1900  Ampicillin-Sulbactam (UNASYN) 3 g in sodium chloride 0.9 % 100 mL IVPB     3 g 200 mL/hr over 30 Minutes Intravenous Every 8 hours 07/09/19 1845 07/16/19 1904     Subjective  The patient the patient is lying in bed with her eyes open. The patient is infantile. She will briefly make eye contact and just as quickly her expression changes. She is infantile in her affect Again the patient says nothing in my presence.  Objective   Vitals:  Vitals:   07/25/19 0347 07/25/19 1401  BP: (!) 148/91 (!) 139/97  Pulse: 88 91  Resp: 18   Temp: 98.6 F (37 C) 98.8 F (37.1 C)  SpO2: 100% 100%    Exam:  Constitutional: The patient is lying with her eyes open this morning. She remains non-verbal and infantile in her mannerisms. Respiratory:   No increased work of breathing  No wheezes, rales, or rhonchi.  No tactile fremitus Cardiovascular:   Tachycardic at greater than 100 BPM.  No murmurs, ectopy or gallups.  No lateral PMI. No thrills. Abdomen:   Abdomen is soft, non-tender, non-distended.  No hernias, masses, or organomegaly  Normoactive bowel sounds. Musculoskeletal:   No cyanosis clubbing or edema. Skin:   No rashes, lesions, ulcers  palpation of skin: no induration or nodules Neurologic:   Patient is unable to cooperate with exam. Psychiatric:  Patient is unable to cooperate with exam.  I have personally reviewed the following:   Today's Data   Vitals, BMP, CBC  Other Data   EEG negative.  Scheduled  Meds:  enoxaparin (LOVENOX) injection  40 mg Subcutaneous Q24H   ferrous sulfate  325 mg Oral Q breakfast   folic acid  1 mg Oral Daily   megestrol  400 mg Oral Daily   metoprolol tartrate  7.5 mg Intravenous Q6H   nicotine  21 mg Transdermal Q0600   nystatin  5 mL Oral QID   sodium chloride flush  10-40 mL Intracatheter Q12H   thiamine  100 mg Oral Daily   Or   thiamine  100 mg Intravenous Daily   Continuous Infusions:  dextrose 5 % and 0.45% NaCl 150 mL/hr at 07/25/19 1807    Principal Problem:   Altered mental status Active Problems:   Rhabdomyolysis   Sinus tachycardia   Suicidal ideation   Psychosis (HCC)   LOS: 19 days   A & P  Decreased level of consciousness/agitation:  Low grade fever yesterday. Antipsychotics stopped. No infectious etiology has been determined. Neurology ordered MRI which was unremarkable. LP was negative for infectious causes. No nuchal rigidity/meningeal signs to suggest meningitis. Autoimmune work up on CSF is pending. Neurology has had the patient on ativan 1 mg qid. The patient's mother has stated that she does not want her daughter to receive this medication. I have discontinued it. I have discussed the patient with Dr. Sharma Covert. She  has evaluated the patient. I appreciate her assistance. Both neurology and psychiatry have recommended Ativan protocol, but the mother is refusing this. They have both also recommended ECT, but the mother has refused this as well. The patient's mother wishes for the patient to be discharged to the [facility] in [place]. They have declined to accept the patient, because she is not taking PO. I have discussed the patient with [facility] today at the request of the patient's mother regarding transfer. I spoke with a Dr. Amil Amen. I explained to Dr. Amil Amen the patient's presentation and ongoing mental status. I have explained to him the patient's work up and the recommendations of  psychiatry and neurology (ativan protocol and ECT). I have explained to him that the patient has taken nothing by mouth for almost 2 weeks. I have also explained to him that the patient's mother has refused all of these interventions. Dr. Amil Amen said that he did not feel that [facility] had anything to add to the patient's work up and that simply transferring the patient for the family's convenience was not a consideration in the current environment when they are at 120% of capacity. I have consulted [name] to help up determine the goals of care with the mother. I will also discuss with case management discharge to another LTAC of rehab facility. I have explained to the mother my interaction with [facility]. I have also consulted Dr. Rhea Pink to assist in determinine goals of care.  Thrush: Nystatin ordered, however, I do not know if the patient will take it.  Rhabdomyolysis: Ongoing. Continue IV fluids. Likely due to agitation. No seizure activitiy on EEG. Mother refusing ativan.  Anorexia: The patient's mother has refused feeding tube placement or artificial nutrition. SLP has been asked to re-evaluate the patient. They are unable to evaluate the patient, because the patient will not cooperate with them. The patient remains NPO. I have ordered megace at the mother's request, but I doubt that the patient will take it. Mother is refusing insertion or use of feeding tube. I have emphasized to her the important of returning nutrition to the fluid and the fact that the patient would not have survived to this point without IV fluids with D5. She says that she will make a decision by Sunday. Must consider ethics consult if either patient does not start eating soon, or the patient's mother does not begin to allow tube feeds.  Hypernatremia/Hyperchloremia: Resolved with increased Iv fluid rate. Pt is taking no PO. Monitor. She is receiving D5 1/2 NS. Will follow electrolytes including magnesium and phosphorus, and  supplement as necessary.  Hypokalemia: 3.4 this morning. Supplement and monitor.  Abnormal LFTs: Likely related to rhabdomyolysis. Avoid hepatotoxic medications. No abdominal pain on examination. In the setting of rhabdomyolysis next-avoid hepatotoxic medications. Monitor and trend.  Persistent Sinus Tachycardia: Monitor on telemetry. Echocardiogram showed Normal EF. Metoprolol was increased, but the patient is refusing PO. She has been started on IV lopressor.Cardiology was consulted and Dr. Nechama Guard feels that her persistent rates within the 100s 120s are is likely secondary to rhabdo and agitation psych aortic issues.  There is no structural heart disease on TTE and likely the treatment will be addressing underlying cause and no further cardiac work-up is recommended at this time. TSH within normal limits.  Low grade fevers with sinus tachycardia: Resolved. Infectious cause? UA negative and urine cultures are negative thus far. Blood cultures negative CXR demonstrated only atelectasis. Consider possible withdrawal from substance?  UDS positive only for benzoes. Dr. Laurence SlateAroor from neurology is consulting. I appreciate his help.   Suicidal Ideation, Agitation/Psychosis: The patient has been evaluated by psychiatry.   Suicide precautions, sitter at bedside. Continue to Monitor. Continue 1:1 sitter.   Renal insufficiency/AKI, worsening slowly: Due to rhabdomyolysis and urinary retention and patient not accepting PO. Will increase IV fluids. Avoid nephrotoxic substances and hypotension. Monitor. 0.79. Today.  Renal ultrasound is negative. Foley catheter is in place.  Hyperphosphatemia: Due to AKI and poor hydration. IV fluids increased. Monitor.  Normocytic Anemia: Hemoglobin stable for last couple of days at 11.3 and 11.0. Monitor.Likely dilutional drop in the setting of IV fluid resuscitation. Checked anemia panel and patient's iron level was 25, U IBC was 304, TIBC was 329, saturation ratios were 8%,  ferritin was 66, folate level was 17.9, and vitamin B12 level was 1033. Monitor.  Obesity:  Estimated body mass index is 36.31 kg/m as calculated from the following. Once recovered the patient should seek assistance from per PCP for a sensible weight loss plan.  The patient was seen and examined by me. I have spent 30 minutes in her evaluation and care. I have discussed the patient with Dr. Kerry HoughMemon Director of the Day. He will accompany on my visit of the patient tomorrow.  DVT prophylaxis: Enoxaparin 40 mg sq Daily  Code Status: FULL CODE  Family Communication: None available today. Disposition Plan: Inpatient psychiatry vs placement in rehab facility.   Bion Todorov, DO [facility] Direct contact: see [email or web address]  7PM-7AM contact night coverage as above 07/25/2019, 7:24 PM  LOS: 4 days

## 2019-07-25 NOTE — Progress Notes (Signed)
Palliative Care consult received and case discussed with Dr. Benny Lennert. Will connect with patient's mother to discuss goals of care on 9/3.  Lane Hacker, DO Palliative Medicine

## 2019-07-26 LAB — GLUCOSE, CAPILLARY
Glucose-Capillary: 103 mg/dL — ABNORMAL HIGH (ref 70–99)
Glucose-Capillary: 103 mg/dL — ABNORMAL HIGH (ref 70–99)
Glucose-Capillary: 105 mg/dL — ABNORMAL HIGH (ref 70–99)
Glucose-Capillary: 111 mg/dL — ABNORMAL HIGH (ref 70–99)
Glucose-Capillary: 85 mg/dL (ref 70–99)

## 2019-07-26 LAB — BASIC METABOLIC PANEL
Anion gap: 10 (ref 5–15)
BUN: <5 mg/dL — ABNORMAL LOW (ref 6–20)
CO2: 24 mmol/L (ref 22–32)
Calcium: 9.1 mg/dL (ref 8.9–10.3)
Chloride: 104 mmol/L (ref 98–111)
Creatinine, Ser: 0.75 mg/dL (ref 0.44–1.00)
GFR calc Af Amer: >60 mL/min (ref >60)
GFR calc non Af Amer: >60 mL/min (ref >60)
Glucose, Bld: 111 mg/dL — ABNORMAL HIGH (ref 70–99)
Potassium: 3.1 mmol/L — ABNORMAL LOW (ref 3.5–5.1)
Sodium: 138 mmol/L (ref 135–145)

## 2019-07-26 LAB — CBC WITH DIFFERENTIAL/PLATELET
Abs Immature Granulocytes: 0.01 10*3/uL (ref 0.00–0.07)
Basophils Absolute: 0.1 10*3/uL (ref 0.0–0.1)
Basophils Relative: 2 %
Eosinophils Absolute: 0.1 10*3/uL (ref 0.0–0.5)
Eosinophils Relative: 2 %
HCT: 30.7 % — ABNORMAL LOW (ref 36.0–46.0)
Hemoglobin: 9.8 g/dL — ABNORMAL LOW (ref 12.0–15.0)
Immature Granulocytes: 0 %
Lymphocytes Relative: 37 %
Lymphs Abs: 1.9 10*3/uL (ref 0.7–4.0)
MCH: 26.2 pg (ref 26.0–34.0)
MCHC: 31.9 g/dL (ref 30.0–36.0)
MCV: 82.1 fL (ref 80.0–100.0)
Monocytes Absolute: 0.4 10*3/uL (ref 0.1–1.0)
Monocytes Relative: 7 %
Neutro Abs: 2.7 10*3/uL (ref 1.7–7.7)
Neutrophils Relative %: 52 %
Platelets: 451 10*3/uL — ABNORMAL HIGH (ref 150–400)
RBC: 3.74 MIL/uL — ABNORMAL LOW (ref 3.87–5.11)
RDW: 12.4 % (ref 11.5–15.5)
WBC: 5.1 10*3/uL (ref 4.0–10.5)
nRBC: 0 % (ref 0.0–0.2)

## 2019-07-26 MED ORDER — POTASSIUM CHLORIDE 10 MEQ/100ML IV SOLN
10.0000 meq | INTRAVENOUS | Status: AC
  Administered 2019-07-26 (×4): 10 meq via INTRAVENOUS
  Filled 2019-07-26 (×4): qty 100

## 2019-07-26 NOTE — Progress Notes (Signed)
  Speech Language Pathology Treatment: Dysphagia  Patient Details Name: Veronica Arias MRN: [id number] DOB: 10-20-1991 Today's Date: 07/26/2019 Time: 4917-9150 SLP Time Calculation (min) (ACUTE ONLY): 30 min  Assessment / Plan / Recommendation Clinical Impression  Pt responded well to co-tx with PT and SLP, sitting at the EOB while she worked on her breakfast tray. She is more interactive than I have seen her before - still very quiet and responding mostly via gestures, but she did generate a few spontaneous and appropriate verbalizations at the sentence level. Min cues faded to supervision for initiation of oral transit. She communicated choices about what she wanted to eat or drink and engaged in self-feeding. Recommend that we upgrade her diet to mechanical soft, keeping thin liquids. If her mentation remains similar, I think she will do well with these consistencies with some set-up assist for cutting food into bite-sized pieces. It may encourage increased intake if we can get her more appealing looking foods. Would encourage food/drinks frequently, but would not force it upon her if she is not orally accepting. SLP will f/u for tolerance.    HPI HPI: 28 year old female who presented to the ED on 8/10 with acute onset of encephalopathy and suicidal ideation. She was found to have rhabdomyolysis as well as persistent tachycardia of unclear etiology. She has continued to deteriorate since admission, experiencing fever, diaphoresis, and catatonia.  Thus far all neurological tests are negative. DDx includes autoimmune encephalitis and resolving NMS. Pt has been evaluated by psychiatry; they plan to admit to inpt psych after medically stable.       SLP Plan  Continue with current plan of care       Recommendations  Diet recommendations: Dysphagia 3 (mechanical soft);Thin liquid Liquids provided via: Cup;Straw Medication Administration: Whole meds with liquid Supervision: Full supervision/cueing  for compensatory strategies;Patient able to self feed Compensations: Minimize environmental distractions;Slow rate;Small sips/bites Postural Changes and/or Swallow Maneuvers: Seated upright 90 degrees                Oral Care Recommendations: Oral care QID Follow up Recommendations: 24 hour supervision/assistance SLP Visit Diagnosis: Dysphagia, unspecified (R13.10) Plan: Continue with current plan of care       GO                Venita Sheffield Dez Stauffer 07/26/2019, 11:47 AM  Pollyann Glen, M.A. [place] Acute Environmental education officer [phone] Office [phone]

## 2019-07-26 NOTE — Progress Notes (Signed)
Physical Therapy Treatment Patient Details Name: Veronica Arias MRN: [id number] DOB: Oct 17, 1991 Today's Date: 07/26/2019    History of Present Illness 28 y.o. female with medical history significant of asthma who presented to the ED on 07/01/2019 for evaluation of suicidal ideation, paranoia, and bizarre behavior. 07/09/19 rapid response called for fever and tachycardia, developed rhabdomyolysis.     PT Comments    Pt is making good progress towards her goals today while working with SLP and PT. Pt asleep on entry and visibly tired throughout session, however agreeable to participate following multistep commands consistently with increased time. Pt requires min A for initial trunk support for coming to sitting EoB. Pt then able to work with SLP on eating breakfast. After eating pt requested to get up to recliner. Pt requiring modAx2 for sit>stand and ambulation of 5 feet to recliner. Pt continues to have strong posterior lean. PT continues to strongly recommend transfer to [facility] where she can receive consistent daily rehabilitation in presence of psychological rehabilitation. PT will continue to follow acutely until transfer.     Follow Up Recommendations  Other (comment)([facility] )     Equipment Recommendations  None recommended by PT       Precautions / Restrictions Precautions Precautions: Fall Restrictions Weight Bearing Restrictions: No    Mobility  Bed Mobility Overal bed mobility: Needs Assistance Bed Mobility: Supine to Sit     Supine to sit: Min assist     General bed mobility comments: pt able to initiate LE movement across bed with verbal cues and only requires minA for trunk support once initially coming to upright and then was able to gain her balance, pt able to scoot hips to EoB  Transfers Overall transfer level: Needs assistance Equipment used: 2 person hand held assist Transfers: Sit to/from [facility] Sit to Stand: +2 physical  assistance;Mod assist         General transfer comment: pt continues to have good power up but requires moda for steadying and continues to rely on posterior lean on bed surface for balance  Ambulation/Gait Ambulation/Gait assistance: +2 physical assistance;Mod assist Gait Distance (Feet): 5 Feet Assistive device: 2 person hand held assist Gait Pattern/deviations: Step-through pattern;Decreased step length - right;Decreased step length - left;Leaning posteriorly Gait velocity: slowed Gait velocity interpretation: <1.31 ft/sec, indicative of household ambulator General Gait Details: modAx2 for steadying with ambulation to and turning towards recliner, pt continues to have posterior lean with ambulation         Balance Overall balance assessment: Needs assistance Sitting-balance support: Single extremity supported;Feet supported Sitting balance-Leahy Scale: Fair     Standing balance support: Bilateral upper extremity supported Standing balance-Leahy Scale: Zero Standing balance comment: continues to have posterior lean requiring assist to maintain balance                            Cognition Arousal/Alertness: Awake/alert Behavior During Therapy: Restless;Flat affect Overall Cognitive Status: Impaired/Different from baseline Area of Impairment: Attention;Memory;Orientation;Following commands;Safety/judgement;Awareness;Problem solving                 Orientation Level: Disoriented to;Place;Time;Situation Current Attention Level: Focused Memory: Decreased recall of precautions;Decreased short-term memory Following Commands: Follows one step commands with increased time;Follows one step commands consistently;Follows multi-step commands consistently;Follows multi-step commands with increased time Safety/Judgement: Decreased awareness of safety;Decreased awareness of deficits Awareness: Intellectual Problem Solving: Slow processing;Difficulty sequencing;Requires  verbal cues;Requires tactile cues;Decreased initiation General Comments: pt very quiet today,  however participatory and able to consistently follow one and multistep commands, pt initiating conversation asking about why her fingernails are so dirty and why she is hooked up to the telebox. She is able to form full sentences and thoughts, although most communication through head nodding to yes/no questions, pt worked with SLP and was able to eat and drink, and communicate preference of food and drink         General Comments General comments (skin integrity, edema, etc.): VSS      Pertinent Vitals/Pain Pain Assessment: Faces Faces Pain Scale: Hurts a little bit Pain Location: generalized with movement Pain Descriptors / Indicators: Grimacing;Guarding Pain Intervention(s): Limited activity within patient's tolerance;Monitored during session;Repositioned           PT Goals (current goals can now be found in the care plan section) Acute Rehab PT Goals Patient Stated Goal: to clean finger nails PT Goal Formulation: Patient unable to participate in goal setting Time For Goal Achievement: 08/02/19 Potential to Achieve Goals: Fair Progress towards PT goals: Progressing toward goals    Frequency    Min 2X/week      PT Plan Current plan remains appropriate    Co-evaluation PT/OT/SLP Co-Evaluation/Treatment: Yes Reason for Co-Treatment: Necessary to address cognition/behavior during functional activity;Complexity of the patient's impairments (multi-system involvement) PT goals addressed during session: Mobility/safety with mobility;Balance        AM-PAC PT "6 Clicks" Mobility   Outcome Measure  Help needed turning from your back to your side while in a flat bed without using bedrails?: Total Help needed moving from lying on your back to sitting on the side of a flat bed without using bedrails?: Total Help needed moving to and from a bed to a chair (including a wheelchair)?:  Total Help needed standing up from a chair using your arms (e.g., wheelchair or bedside chair)?: Total Help needed to walk in hospital room?: Total Help needed climbing 3-5 steps with a railing? : Total 6 Click Score: 6    End of Session Equipment Utilized During Treatment: Gait belt Activity Tolerance: Patient tolerated treatment well Patient left: in chair;with call bell/phone within reach;with nursing/sitter in room Nurse Communication: Mobility status PT Visit Diagnosis: Unsteadiness on feet (R26.81);Other abnormalities of gait and mobility (R26.89);Muscle weakness (generalized) (M62.81);Difficulty in walking, not elsewhere classified (R26.2);Other symptoms and signs involving the nervous system (Z61.096(R29.898)     Time: [id number] PT Time Calculation (min) (ACUTE ONLY): 29 min  Charges:  $Gait Training: 8-22 mins                     Veronica Arias PT, DPT Acute Rehabilitation Services Pager [phone] Office [phone]    Veronica Arias 07/26/2019, 11:12 AM

## 2019-07-26 NOTE — Progress Notes (Addendum)
1930 Report received, pt resting in bed, non-verbal at time of assessment, safety sitter informed RN that she has not spoken to her either but reported that she spoke to mom today during visitation, pt smiling sporadically, inappropriate facial expressions noted. Does not follow commands or nod her head appropriately. VSS, WCTM.   0150 Pt attempting to get OOB, asking to get her hair washed, stating it's. "itchy" asking NT to cut her hair or if she can take a shower. Pt keeps smiling sporadically, doesn't answer questions when RN asks her name, or knows where she is, nods her head "no" to pain, if she's hot/cold, hungry. States she's tired, but can't sleep. Nods her head "no" when asked if she wants to watch TV, listen to music or color on paper. Pt now falling asleep after ativan given, WCTM.   0630 Pt has been sleeping post administration of ativan. NAD, sitter at bedside, Kimberly.

## 2019-07-26 NOTE — Progress Notes (Signed)
°PROGRESS NOTE ° °Veronica Arias MRN:[id number] DOB: 07/04/1991 DOA: 07/01/2019 °PCP: Patient, No Pcp Per ° °Brief History   °Veronica Arias is a 28 y.o. female with medical history significant of asthma who presented to the ED on 07/01/2019 for evaluation of suicidal ideation, paranoia, and bizarre behavior.  Patient had recent ED visit 2 days ago for similar behavior.  Per police report, she was found on the balcony of her apartment throwing things and saying she was suicidal and was going to jump.  On initial labs, no leukocytosis, blood ethanol level negative, acetaminophen level negative, salicylate level negative.  Transaminases elevated (AST 76, ALT 145), remainder of LFTs normal. Beta-hCG negative.  COVID 19 rapid test negative.  TSH normal.  UDS positive for benzodiazepines.  Labs done on 07/03/2019 showing CK 2998 and has continued to worsen since then.  On 8/13, CK 4536, blood glucose 63.  Blood glucose subsequently improved.  Today, CK uptrending 4011 > 4037.  Transaminases improved (AST 70, ALT 90).  Head CT done today showing no acute intracranial abnormality.  Patient has received a total of 6 L IV fluid boluses in the past 2 days.  However, CK continue's to trend up and patient is persistently tachycardic with heart rate up to 130s.  She has continued to be highly agitated throughout her ED stay for the past 4 days.Trying to pull out IV lines etc.  Refusing p.o.'s.  Patient has even managed to get out of soft restraints and has required security assistance to get her back to her room. Required multiple rounds of antipsychotics, benzodiazepines, diphenhydramine etc. for the past 4 days.  Due to CK uptrending, psychiatry recommended stopping antipsychotics/ Zyprexa as they may be causing rhabdo.  Accepted by inpatient psychiatry, but patient not taking PO. Mother refusing enteral feeds.   Internal medicine teaching service was called and declined hospital admission.  [facility] to admit patient for mgmt  of rhabdomyolysis, persistent tachycardia, agitation/paranoia/hallucinations. °  °On the evening of 07/09/2019 the patient had a rapid response called due to fever and tachycardia. She was moved to a telemetry floor. Mother is at bedside today and states that the patient was awake, but not herself earlier today. EEG negative for seizure.  ° °Neurology was consulted. MRI was negative. LP was performed under fluoro on 07/13/2019. It was negative for infection. Autoimmune work up is pending, and the patient is now undergoing a long term EEG. Autoimmune studies on CSF is pending.  ° °The patient's mother has refused feeding tube placement or artificial nutrition. SLP has been asked to re-evaluate the patient. They are unable to evaluate the patient, because the patient will not cooperate with them. The patient remains NPO. On 07/16/2019 the patient's mother also stated that she did not wish for the patient to receive any more ativan. The patient's mother wishes for the patient to be discharged to the [facility] in [place]. They have declined to accept the patient, because she is not taking PO. I have discussed the patient with [name] today at the request of the patient's mother regarding transfer. I spoke with a [name]. I explained to [name] the patient's presentation and ongoing mental status. I have explained to him the patient's work up and the recommendations of psychiatry and neurology (ativan protocol and ECT). I have explained to him that the patient has taken nothing by mouth for almost 2 weeks. I have also explained to him that the patient's mother has refused all of these interventions. [name] said that he did not feel that [facility] had anything to add to the patient's work up and that simply transferring the patient for the family's convenience was not a consideration in the   current environment when they are at 120% of capacity.   I have consulted [name] to  help up determine the goals of care with the mother. I will also discuss with case management discharge to another LTAC of rehab facility. I visited the patient and her mother with [name] today. The mother met with [name] on 07/25/2019. After reading [name] account of their encounter and talking with the patient's mother today, I find theses two accounts very incongruent. Consultants   Neurology  Psychiatry  Palliative care.  Procedures   EEG  LP  Antibiotics   Anti-infectives (From admission, onward)   Start     Dose/Rate Route Frequency Ordered Stop   07/09/19 1900  Ampicillin-Sulbactam (UNASYN) 3 g in sodium chloride 0.9 % 100 mL IVPB     3 g 200 mL/hr over 30 Minutes Intravenous Every 8 hours 07/09/19 1845 07/16/19 1904     Subjective  The patient the patient is sitting up in a chair at the window. Upon my visit the patient is sleeping. In talking with her sitter I learn that today for the first time the patient has had an actual conversation with this sitter. The patient is minimally cooperative with exam today and is not verbal with me. The sitter also tells me that the patient has had a couple of bites of food.  Objective   Vitals:  Vitals:   07/26/19 1537 07/26/19 1538  BP: 126/82 126/82  Pulse:    Resp:  18  Temp:  98.1 F (36.7 C)  SpO2:  100%    Exam:  Constitutional: The patient is  Sitting up in a chair at the window. Her eyes are closed. She will not open them for me.  Respiratory:   No increased work of breathing  No wheezes, rales, or rhonchi.  No tactile fremitus Cardiovascular:   Tachycardic at greater than 100 BPM.  No murmurs, ectopy or gallups.  No lateral PMI. No thrills. Abdomen:   Abdomen is soft, non-tender, non-distended.  No hernias, masses, or organomegaly  Normoactive bowel sounds. Musculoskeletal:   No cyanosis clubbing or edema. Skin:   No rashes, lesions, ulcers  palpation of skin: no induration or  nodules Neurologic:   Patient is unable to cooperate with exam. Psychiatric:  Patient is unable to cooperate with exam.  I have personally reviewed the following:   Today's Data   Vitals, BMP, CBC  Other Data   EEG negative.  Scheduled Meds:  enoxaparin (LOVENOX) injection  40 mg Subcutaneous Q24H   ferrous sulfate  325 mg Oral Q breakfast   folic acid  1 mg Oral Daily   LORazepam  1 mg Intravenous QHS   metoprolol tartrate  7.5 mg Intravenous Q6H   nicotine  21 mg Transdermal Q0600   nystatin  5 mL Oral QID   sodium chloride flush  10-40 mL Intracatheter Q12H   thiamine  100 mg Oral Daily   Or   thiamine  100 mg Intravenous Daily   Continuous Infusions:  dextrose 5 % and 0.45% NaCl 150 mL/hr at 07/26/19 1241   potassium chloride 10 mEq (07/26/19 1813)    Principal Problem:   Altered mental status Active Problems:   Rhabdomyolysis   Sinus tachycardia   Suicidal ideation   Psychosis ([place])   LOS: 20 days   A & P  Decreased level of consciousness/agitation:  Low grade fever yesterday. Antipsychotics stopped. No infectious etiology has been determined. Neurology ordered MRI which was  unremarkable. LP was negative for infectious causes. No nuchal rigidity/meningeal signs to suggest meningitis. Autoimmune work up on CSF is pending. Neurology has had the patient on ativan 1 mg qid. The patient's mother has stated that she does not want her daughter to receive this medication. I have discontinued it. I have discussed the patient with Dr. Mariea Clonts. She has evaluated the patient. I appreciate her assistance. Both neurology and psychiatry have recommended Ativan protocol, but the mother is refusing this. They have both also recommended ECT, but the mother has refused this as well. The patient's mother wishes for the patient to be discharged to the [facility] in [place]. They have declined to accept the patient, because she is not taking PO. I have discussed the  patient with [facility] today at the request of the patient's mother regarding transfer. I spoke with a Dr. Amil Amen. I explained to Dr. Amil Amen the patient's presentation and ongoing mental status. I have explained to him the patient's work up and the recommendations of psychiatry and neurology (ativan protocol and ECT). I have explained to him that the patient has taken nothing by mouth for almost 2 weeks. I have also explained to him that the patient's mother has refused all of these interventions. Dr. Amil Amen said that he did not feel that [facility] had anything to add to the patient's work up and that simply transferring the patient for the family's convenience was not a consideration in the current environment when they are at 120% of capacity. I have consulted [name] to help up determine the goals of care with the mother. I will also discuss with case management discharge to another LTAC of rehab facility. I have explained to the mother my interaction with [facility]. I have also consulted Dr. Rhea Pink to assist in determinine goals of care. She saw the patient on 07/25/2019. After reading [name] account of their encounter and talking with the patient's mother today, I find theses two accounts very incongruent. [name] seems to indicate that the mother was saying that she would consider allowing therapies such as ECT and ativan protocol for the patient. The mother told [name] and I that [name] advised against ECT and ativan protocol. She also stated that [name] told her that she would be calling [facility] and [facility] about a transfer. I do not believe this to be the case. On our visit today the patient's mother told [name] and I that she would think about allowing a feeding tube over the weekend. This is exactly what she told me during my meeting with the patient's mother and aunt last week. I appreciate Dr. Blythe Stanford assistance.  Thrush: Nystatin  ordered, however, I do not know if the patient will take it.  Rhabdomyolysis: Ongoing. Continue IV fluids. Likely due to agitation. No seizure activitiy on EEG. Mother refusing ativan.  Anorexia: The patient's mother has refused feeding tube placement or artificial nutrition. SLP has been asked to re-evaluate the patient. They are unable to evaluate the patient, because the patient will not cooperate with them. The patient remains NPO. I have ordered megace at the mother's request, but I doubt that the patient will take it. Mother is refusing insertion or use of feeding tube. I have emphasized to her the important of returning nutrition to the fluid and the fact that the patient would not have survived to this point without IV fluids with D5. She says that she will make a  decision by Sunday. Must consider ethics consult if either patient does not start eating soon, or the patient's mother does not begin to allow tube feeds.  Hypernatremia/Hyperchloremia: Resolved with increased Iv fluid rate. Pt is taking no PO. Monitor. She is receiving D5 1/2 NS. Will follow electrolytes including magnesium and phosphorus, and supplement as necessary.  Hypokalemia: 3.1 this morning. Supplement and monitor.  Abnormal LFTs: Likely related to rhabdomyolysis. Avoid hepatotoxic medications. No abdominal pain on examination. In the setting of rhabdomyolysis next-avoid hepatotoxic medications. Monitor and trend.  Persistent Sinus Tachycardia: Monitor on telemetry. Echocardiogram showed Normal EF. Metoprolol was increased, but the patient is refusing PO. She has been started on IV lopressor.Cardiology was consulted and Dr. Nechama Guard feels that her persistent rates within the 100s 120s are is likely secondary to rhabdo and agitation psych aortic issues.  There is no structural heart disease on TTE and likely the treatment will be addressing underlying cause and no further cardiac work-up is recommended at this time. TSH within  normal limits.  Low grade fevers with sinus tachycardia: Resolved. Infectious cause? UA negative and urine cultures are negative thus far. Blood cultures negative CXR demonstrated only atelectasis. Consider possible withdrawal from substance? UDS positive only for benzoes. Dr. Lorraine Lax from neurology is consulting. I appreciate his help.   Suicidal Ideation, Agitation/Psychosis: The patient has been evaluated by psychiatry.   Suicide precautions, sitter at bedside. Continue to Monitor. Continue 1:1 sitter.   Renal insufficiency/AKI, worsening slowly: Due to rhabdomyolysis and urinary retention and patient not accepting PO. Will increase IV fluids. Avoid nephrotoxic substances and hypotension. Monitor. 0.79. Today.  Renal ultrasound is negative. Foley catheter is in place.  Hyperphosphatemia: Due to AKI and poor hydration. IV fluids increased. Monitor.  Normocytic Anemia: Hemoglobin stable for last couple of days at 11.3 and 11.0. Monitor.Likely dilutional drop in the setting of IV fluid resuscitation. Checked anemia panel and patient's iron level was 25, U IBC was 304, TIBC was 329, saturation ratios were 8%, ferritin was 66, folate level was 17.9, and vitamin B12 level was 1033. Monitor.  Obesity:  Estimated body mass index is 36.31 kg/m as calculated from the following. Once recovered the patient should seek assistance from per PCP for a sensible weight loss plan.  The patient was seen and examined by me. I have spent 40 minutes in her evaluation and care.  DVT prophylaxis: Enoxaparin 40 mg sq Daily  Code Status: FULL CODE  Family Communication: None available today. Disposition Plan: Inpatient psychiatry vs placement in rehab facility.   Jorita Bohanon, DO [facility] Direct contact: see [email or web address]  7PM-7AM contact night coverage as above 07/26/2019, 7:09 PM  LOS: 4 days

## 2019-07-26 NOTE — Progress Notes (Signed)
Pt alert/awake.  Was able to tell her name.  She was smiling and said she was confused. Took PO meds fine, and had 40% of her breakfast. Pt is up in recliner resting.  Idolina Primer, RN

## 2019-07-26 NOTE — Progress Notes (Signed)
IVC paperwork sent to magistrate. [employer] contacted to serve patient. Paperwork on shadow chart.   Percell Locus Orissa Arreaga LCSW [phone]

## 2019-07-27 LAB — BASIC METABOLIC PANEL
Anion gap: 8 (ref 5–15)
BUN: <5 mg/dL — ABNORMAL LOW (ref 6–20)
CO2: 25 mmol/L (ref 22–32)
Calcium: 9 mg/dL (ref 8.9–10.3)
Chloride: 105 mmol/L (ref 98–111)
Creatinine, Ser: 0.82 mg/dL (ref 0.44–1.00)
GFR calc Af Amer: >60 mL/min (ref >60)
GFR calc non Af Amer: >60 mL/min (ref >60)
Glucose, Bld: 122 mg/dL — ABNORMAL HIGH (ref 70–99)
Potassium: 3.2 mmol/L — ABNORMAL LOW (ref 3.5–5.1)
Sodium: 138 mmol/L (ref 135–145)

## 2019-07-27 LAB — GLUCOSE, CAPILLARY
Glucose-Capillary: 106 mg/dL — ABNORMAL HIGH (ref 70–99)
Glucose-Capillary: 122 mg/dL — ABNORMAL HIGH (ref 70–99)
Glucose-Capillary: 127 mg/dL — ABNORMAL HIGH (ref 70–99)
Glucose-Capillary: 90 mg/dL (ref 70–99)
Glucose-Capillary: 91 mg/dL (ref 70–99)
Glucose-Capillary: 95 mg/dL (ref 70–99)
Glucose-Capillary: 97 mg/dL (ref 70–99)

## 2019-07-27 MED ORDER — POTASSIUM CHLORIDE 10 MEQ/100ML IV SOLN
10.0000 meq | INTRAVENOUS | Status: AC
  Administered 2019-07-27 (×4): 10 meq via INTRAVENOUS
  Filled 2019-07-27 (×5): qty 100

## 2019-07-27 MED ORDER — HALOPERIDOL LACTATE 5 MG/ML IJ SOLN
2.0000 mg | Freq: Once | INTRAMUSCULAR | Status: AC
  Administered 2019-07-27: 2 mg via INTRAVENOUS
  Filled 2019-07-27: qty 1

## 2019-07-27 MED ORDER — HALOPERIDOL LACTATE 5 MG/ML IJ SOLN
2.0000 mg | Freq: Four times a day (QID) | INTRAMUSCULAR | Status: DC | PRN
  Administered 2019-07-27 – 2019-08-02 (×10): 2 mg via INTRAVENOUS
  Filled 2019-07-27 (×11): qty 1

## 2019-07-27 NOTE — Progress Notes (Signed)
°PROGRESS NOTE ° °Veronica Arias MRN:[id number] DOB: 02/05/1991 DOA: 07/01/2019 °PCP: Patient, No Pcp Per ° °Brief History   °Veronica Arias is a 28 y.o. female with medical history significant of asthma who presented to the ED on 07/01/2019 for evaluation of suicidal ideation, paranoia, and bizarre behavior.  Patient had recent ED visit 2 days ago for similar behavior.  Per police report, she was found on the balcony of her apartment throwing things and saying she was suicidal and was going to jump.  On initial labs, no leukocytosis, blood ethanol level negative, acetaminophen level negative, salicylate level negative.  Transaminases elevated (AST 76, ALT 145), remainder of LFTs normal. Beta-hCG negative.  COVID 19 rapid test negative.  TSH normal.  UDS positive for benzodiazepines.  Labs done on 07/03/2019 showing CK 2998 and has continued to worsen since then.  On 8/13, CK 4536, blood glucose 63.  Blood glucose subsequently improved.  Today, CK uptrending 4011 > 4037.  Transaminases improved (AST 70, ALT 90).  Head CT done today showing no acute intracranial abnormality.  Patient has received a total of 6 L IV fluid boluses in the past 2 days.  However, CK continue's to trend up and patient is persistently tachycardic with heart rate up to 130s.  She has continued to be highly agitated throughout her ED stay for the past 4 days.Trying to pull out IV lines etc.  Refusing p.o.'s.  Patient has even managed to get out of soft restraints and has required security assistance to get her back to her room. Required multiple rounds of antipsychotics, benzodiazepines, diphenhydramine etc. for the past 4 days.  Due to CK uptrending, psychiatry recommended stopping antipsychotics/ Zyprexa as they may be causing rhabdo.  Accepted by inpatient psychiatry, but patient not taking PO. Mother refusing enteral feeds.   Internal medicine teaching service was called and declined hospital admission.  [facility] to admit patient for mgmt  of rhabdomyolysis, persistent tachycardia, agitation/paranoia/hallucinations. °  °On the evening of 07/09/2019 the patient had a rapid response called due to fever and tachycardia. She was moved to a telemetry floor. Mother is at bedside today and states that the patient was awake, but not herself earlier today. EEG negative for seizure.  ° °Neurology was consulted. MRI was negative. LP was performed under fluoro on 07/13/2019. It was negative for infection. Autoimmune work up is pending, and the patient is now undergoing a long term EEG. Autoimmune studies on CSF is pending.  ° °The patient's mother has refused feeding tube placement or artificial nutrition. SLP has been asked to re-evaluate the patient. They are unable to evaluate the patient, because the patient will not cooperate with them. The patient remains NPO. On 07/16/2019 the patient's mother also stated that she did not wish for the patient to receive any more ativan. The patient's mother wishes for the patient to be discharged to the [facility] in [place]. They have declined to accept the patient, because she is not taking PO. I have discussed the patient with [name] today at the request of the patient's mother regarding transfer. I spoke with a [name]. I explained to [name] the patient's presentation and ongoing mental status. I have explained to him the patient's work up and the recommendations of psychiatry and neurology (ativan protocol and ECT). I have explained to him that the patient has taken nothing by mouth for almost 2 weeks. I have also explained to him that the patient's mother has refused all of these interventions. [name] said that he did not feel that [facility] had anything to add to the patient's work up and that simply transferring the patient for the family's convenience was not a consideration in the   the current environment when they are at 120% of capacity.   I have consulted [name] to  help up determine the goals of care with the mother. I will also discuss with case management discharge to another LTAC of rehab facility. I visited the patient and her mother with [name] today. The mother met with [name] on 07/25/2019. After reading [name] account of their encounter and talking with the patient's mother today, I find theses two accounts very incongruent. Consultants   Neurology  Psychiatry  Palliative care.  Procedures   EEG  LP  Antibiotics   Anti-infectives (From admission, onward)   Start     Dose/Rate Route Frequency Ordered Stop   07/09/19 1900  Ampicillin-Sulbactam (UNASYN) 3 g in sodium chloride 0.9 % 100 mL IVPB     3 g 200 mL/hr over 30 Minutes Intravenous Every 8 hours 07/09/19 1845 07/16/19 1904     Subjective  This morning the patient had some extreme agitation with acting out and use of expletives. Security had to be called. Nursing contacted me and I gave them a prn order for Haldol. However, security was able to get the patient into bed. When she got into bed she again became "catatonic" without speaking or moving. She was still in this state when I came to see her.  Objective   Vitals:  Vitals:   07/27/19 1108 07/27/19 1500  BP: 140/89 131/82  Pulse: 60   Resp:    Temp:    SpO2:      Exam:  Constitutional: The patient is lying quietly in bed with her eyes close. She will not open her eyes or follow commands.  Respiratory:   No increased work of breathing  No wheezes, rales, or rhonchi.  No tactile fremitus Cardiovascular:   Tachycardic at greater than 100 BPM.  No murmurs, ectopy or gallups.  No lateral PMI. No thrills. Abdomen:   Abdomen is soft, non-tender, non-distended.  No hernias, masses, or organomegaly  Normoactive bowel sounds. Musculoskeletal:   No cyanosis clubbing or edema. Skin:   No rashes, lesions, ulcers  palpation of skin: no induration or nodules Neurologic:   Patient is unable  to cooperate with exam. Psychiatric:  Patient is unable to cooperate with exam.  I have personally reviewed the following:   Today's Data   Vitals, BMP  Other Data   EEG negative.  Scheduled Meds:  enoxaparin (LOVENOX) injection  40 mg Subcutaneous Q24H   ferrous sulfate  325 mg Oral Q breakfast   folic acid  1 mg Oral Daily   LORazepam  1 mg Intravenous QHS   metoprolol tartrate  7.5 mg Intravenous Q6H   nicotine  21 mg Transdermal Q0600   nystatin  5 mL Oral QID   sodium chloride flush  10-40 mL Intracatheter Q12H   thiamine  100 mg Oral Daily   Or   thiamine  100 mg Intravenous Daily   Continuous Infusions:  dextrose 5 % and 0.45% NaCl 150 mL/hr at 07/27/19 1625    Principal Problem:   Altered mental status Active Problems:   Rhabdomyolysis   Sinus tachycardia   Suicidal ideation   Psychosis ([place])   LOS: 21 days   A & P  Decreased level of consciousness/agitation:  Low grade fever yesterday. Antipsychotics stopped. No infectious etiology has been determined. Neurology ordered MRI which was unremarkable. LP was negative for infectious causes. No nuchal rigidity/meningeal signs to suggest meningitis. Autoimmune work up on CSF is  pending. Neurology has had the patient on ativan 1 mg qid. The patient's mother has stated that she does not want her daughter to receive this medication. I have discontinued it. I have discussed the patient with Dr. Mariea Clonts. She has evaluated the patient. I appreciate her assistance. Both neurology and psychiatry have recommended Ativan protocol, but the mother is refusing this. They have both also recommended ECT, but the mother has refused this as well. The patient's mother wishes for the patient to be discharged to the [facility] in [place]. They have declined to accept the patient, because she is not taking PO. I have discussed the patient with [facility] today at the request of the patient's mother  regarding transfer. I spoke with a Dr. Amil Amen. I explained to Dr. Amil Amen the patient's presentation and ongoing mental status. I have explained to him the patient's work up and the recommendations of psychiatry and neurology (ativan protocol and ECT). I have explained to him that the patient has taken nothing by mouth for almost 2 weeks. I have also explained to him that the patient's mother has refused all of these interventions. Dr. Amil Amen said that he did not feel that [facility] had anything to add to the patient's work up and that simply transferring the patient for the family's convenience was not a consideration in the current environment when they are at 120% of capacity. I have consulted [name] to help up determine the goals of care with the mother. I will also discuss with case management discharge to another LTAC of rehab facility. I have explained to the mother my interaction with [facility]. I have also consulted Dr. Rhea Pink to assist in determinine goals of care. She saw the patient on 07/25/2019. After reading [name] account of their encounter and talking with the patient's mother today, I find theses two accounts very incongruent. [name] seems to indicate that the mother was saying that she would consider allowing therapies such as ECT and ativan protocol for the patient. The mother told [name] and I that [name] advised against ECT and ativan protocol. She also stated that [name] told her that she would be calling [facility] and [facility] about a transfer. I do not believe this to be the case. On our visit today the patient's mother told [name] and I that she would think about allowing a feeding tube over the weekend. This is exactly what she told me during my meeting with the patient's mother and aunt last week. I appreciate Dr. Blythe Stanford assistance.  Agitation alternating with decreased level of consciousness.  Thrush: Nystatin ordered, however, I do not know  if the patient will take it.  Rhabdomyolysis: Ongoing. Continue IV fluids. Likely due to agitation. No seizure activitiy on EEG. Mother refusing ativan.  Anorexia: The patient's mother has refused feeding tube placement or artificial nutrition. SLP has been asked to re-evaluate the patient. They are unable to evaluate the patient, because the patient will not cooperate with them. The patient remains NPO. I have ordered megace at the mother's request, but I doubt that the patient will take it. Mother is refusing insertion or use of feeding tube. I have emphasized to her the important of returning nutrition to the fluid and the fact that the patient would not have survived to this point without IV fluids with D5. She says that she will make a decision by Sunday. Must consider ethics consult if either patient does not  start eating soon, or the patient's mother does not begin to allow tube feeds.  Hypernatremia/Hyperchloremia: Resolved with increased Iv fluid rate. Pt is taking no PO. Monitor. She is receiving D5 1/2 NS. Will follow electrolytes including magnesium and phosphorus, and supplement as necessary.  Hypokalemia: 3.1 this morning. Supplement and monitor.  Abnormal LFTs: Likely related to rhabdomyolysis. Avoid hepatotoxic medications. No abdominal pain on examination. In the setting of rhabdomyolysis next-avoid hepatotoxic medications. Monitor and trend.  Persistent Sinus Tachycardia: Monitor on telemetry. Echocardiogram showed Normal EF. Metoprolol was increased, but the patient is refusing PO. She has been started on IV lopressor.Cardiology was consulted and Dr. Nechama Guard feels that her persistent rates within the 100s 120s are is likely secondary to rhabdo and agitation psych aortic issues.  There is no structural heart disease on TTE and likely the treatment will be addressing underlying cause and no further cardiac work-up is recommended at this time. TSH within normal limits.  Low grade  fevers with sinus tachycardia: Resolved. Infectious cause? UA negative and urine cultures are negative thus far. Blood cultures negative CXR demonstrated only atelectasis. Consider possible withdrawal from substance? UDS positive only for benzoes. Dr. Lorraine Lax from neurology is consulting. I appreciate his help.   Suicidal Ideation, Agitation/Psychosis: The patient has been evaluated by psychiatry.   Suicide precautions, sitter at bedside. Continue to Monitor. Continue 1:1 sitter.   Renal insufficiency/AKI, worsening slowly: Due to rhabdomyolysis and urinary retention and patient not accepting PO. Will increase IV fluids. Avoid nephrotoxic substances and hypotension. Monitor. 0.79. Today.  Renal ultrasound is negative. Foley catheter is in place.  Hyperphosphatemia: Due to AKI and poor hydration. IV fluids increased. Monitor.  Normocytic Anemia: Hemoglobin stable for last couple of days at 11.3 and 11.0. Monitor.Likely dilutional drop in the setting of IV fluid resuscitation. Checked anemia panel and patient's iron level was 25, U IBC was 304, TIBC was 329, saturation ratios were 8%, ferritin was 66, folate level was 17.9, and vitamin B12 level was 1033. Monitor.  Obesity:  Estimated body mass index is 36.31 kg/m as calculated from the following. Once recovered the patient should seek assistance from per PCP for a sensible weight loss plan.  The patient was seen and examined by me. I have spent 30 minutes in her evaluation and care.  DVT prophylaxis: Enoxaparin 40 mg sq Daily  Code Status: FULL CODE  Family Communication: None available today. Disposition Plan: Inpatient psychiatry vs placement in rehab facility.    , DO [facility] Direct contact: see [email or web address]  7PM-7AM contact night coverage as above 07/27/2019, 5:42 PM  LOS: 4 days

## 2019-07-27 NOTE — Progress Notes (Signed)
Pt.was restless  & agitated & trying to get up.MD on call Kennon Holter was called & made aware  That Ativan  1mg  was given at 10 pm but stll agitated & restless,& she ordered to give haldol 2mg  IV.

## 2019-07-27 NOTE — Progress Notes (Signed)
Bedside sitter called out for immediate assistance. Upon assessment, patient was found to be agitated and attempting to removed safety mittens which had been placed during the night. Patient not following commands and not receptive to redirection. Patient verbally aggressive towards staff; yelling multiple expletives. Patient attempting to get out of bed and pulling at lines and monitors. De-escalation techniques ineffective; patient with increasing agitation and aggression. Called security for assistance; security assisted patient back to bed. Upon return to bed, patient became non-interactive and would not make eye contact or verbally engage with staff. Paged Dr. Sue Lush about the above situation.

## 2019-07-28 LAB — GLUCOSE, CAPILLARY
Glucose-Capillary: 128 mg/dL — ABNORMAL HIGH (ref 70–99)
Glucose-Capillary: 121 mg/dL — ABNORMAL HIGH (ref 70–99)
Glucose-Capillary: 91 mg/dL (ref 70–99)
Glucose-Capillary: 87 mg/dL (ref 70–99)
Glucose-Capillary: 89 mg/dL (ref 70–99)

## 2019-07-28 LAB — BASIC METABOLIC PANEL
Anion gap: 8 (ref 5–15)
BUN: <5 mg/dL — ABNORMAL LOW (ref 6–20)
CO2: 27 mmol/L (ref 22–32)
Calcium: 9.5 mg/dL (ref 8.9–10.3)
Chloride: 104 mmol/L (ref 98–111)
Creatinine, Ser: 0.71 mg/dL (ref 0.44–1.00)
GFR calc Af Amer: >60 mL/min (ref >60)
GFR calc non Af Amer: >60 mL/min (ref >60)
Glucose, Bld: 100 mg/dL — ABNORMAL HIGH (ref 70–99)
Potassium: 3.9 mmol/L (ref 3.5–5.1)
Sodium: 139 mmol/L (ref 135–145)

## 2019-07-28 MED ORDER — JEVITY 1.2 CAL PO LIQD
1000.0000 mL | ORAL | Status: DC
  Filled 2019-07-28 (×2): qty 1000

## 2019-07-28 NOTE — Progress Notes (Signed)
°PROGRESS NOTE ° °Veronica Arias MRN:[id number] DOB: 12/26/1990 DOA: 07/01/2019 °PCP: Patient, No Pcp Per ° °Brief History   °Veronica Arias is a 28 y.o. female with medical history significant of asthma who presented to the ED on 07/01/2019 for evaluation of suicidal ideation, paranoia, and bizarre behavior.  Patient had recent ED visit 2 days ago for similar behavior.  Per police report, she was found on the balcony of her apartment throwing things and saying she was suicidal and was going to jump.  On initial labs, no leukocytosis, blood ethanol level negative, acetaminophen level negative, salicylate level negative.  Transaminases elevated (AST 76, ALT 145), remainder of LFTs normal. Beta-hCG negative.  COVID 19 rapid test negative.  TSH normal.  UDS positive for benzodiazepines.  Labs done on 07/03/2019 showing CK 2998 and has continued to worsen since then.  On 8/13, CK 4536, blood glucose 63.  Blood glucose subsequently improved.  Today, CK uptrending 4011 > 4037.  Transaminases improved (AST 70, ALT 90).  Head CT done today showing no acute intracranial abnormality.  Patient has received a total of 6 L IV fluid boluses in the past 2 days.  However, CK continue's to trend up and patient is persistently tachycardic with heart rate up to 130s.  She has continued to be highly agitated throughout her ED stay for the past 4 days.Trying to pull out IV lines etc.  Refusing p.o.'s.  Patient has even managed to get out of soft restraints and has required security assistance to get her back to her room. Required multiple rounds of antipsychotics, benzodiazepines, diphenhydramine etc. for the past 4 days.  Due to CK uptrending, psychiatry recommended stopping antipsychotics/ Zyprexa as they may be causing rhabdo.  Accepted by inpatient psychiatry, but patient not taking PO. Mother refusing enteral feeds.   Internal medicine teaching service was called and declined hospital admission.  [facility] to admit patient for mgmt  of rhabdomyolysis, persistent tachycardia, agitation/paranoia/hallucinations. °  °On the evening of 07/09/2019 the patient had a rapid response called due to fever and tachycardia. She was moved to a telemetry floor. Mother is at bedside today and states that the patient was awake, but not herself earlier today. EEG negative for seizure.  ° °Neurology was consulted. MRI was negative. LP was performed under fluoro on 07/13/2019. It was negative for infection. Autoimmune work up is pending, and the patient is now undergoing a long term EEG. Autoimmune studies on CSF is pending.  ° °The patient's mother has refused feeding tube placement or artificial nutrition. SLP has been asked to re-evaluate the patient. They are unable to evaluate the patient, because the patient will not cooperate with them. The patient remains NPO. On 07/16/2019 the patient's mother also stated that she did not wish for the patient to receive any more ativan. The patient's mother wishes for the patient to be discharged to the [facility] in [place]. They have declined to accept the patient, because she is not taking PO. I have discussed the patient with [name] today at the request of the patient's mother regarding transfer. I spoke with a [name]. I explained to [name] the patient's presentation and ongoing mental status. I have explained to him the patient's work up and the recommendations of psychiatry and neurology (ativan protocol and ECT). I have explained to him that the patient has taken nothing by mouth for almost 2 weeks. I have also explained to him that the patient's mother has refused all of these interventions. [name] said that he did not feel that [facility] had anything to add to the patient's work up and that simply transferring the patient for the family's convenience was not a consideration in the   current environment when they are at 120% of capacity.   I have consulted [name] to  help up determine the goals of care with the mother. I will also discuss with case management discharge to another LTAC of rehab facility. I visited the patient and her mother with [name] today. The mother met with [name] on 07/25/2019. After reading [name] account of their encounter and talking with the patient's mother today, I find theses two accounts very incongruent.  On 07/28/2019 the patient has agreed to placement of feeding tube. Cortrak team will not be available until 07/30/2019. I have discussed the patient with IR, Radiology, Yoko ( the patient's nurse) and the charge nurse for 6E. IR states that they don't place cortraks, Radiology (Dr.Stroud) states that he will not attempt to place cortrak until the floor nurses has attempted it "several times", and floor nurses state that they will not put in cortrak and that only the [place] team can place it. They will not be available until Tuesday. I have placed an order for NGT as I understand that nursing will place these on the floor, or at least attempt them. Consultants   Neurology  Psychiatry  Palliative care.  Procedures   EEG  LP  Antibiotics   Anti-infectives (From admission, onward)   Start     Dose/Rate Route Frequency Ordered Stop   07/09/19 1900  Ampicillin-Sulbactam (UNASYN) 3 g in sodium chloride 0.9 % 100 mL IVPB     3 g 200 mL/hr over 30 Minutes Intravenous Every 8 hours 07/09/19 1845 07/16/19 1904     Subjective  The patient is sitting up in bed. She has her eyes open, but is not making eye contact or communicating. She is not following commands. Objective   Vitals:  Vitals:   07/28/19 0823 07/28/19 1200  BP: 130/87 126/78  Pulse: 80 96  Resp: 18 18  Temp: 98.6 F (37 C) 98.3 F (36.8 C)  SpO2: 100% 100%    Exam:  Constitutional: The patient is lying quietly in bed with her eyes open. No acute distress. Respiratory:   No increased work of breathing  No wheezes, rales, or rhonchi.  No  tactile fremitus Cardiovascular:   Tachycardic at greater than 100 BPM.  No murmurs, ectopy or gallups.  No lateral PMI. No thrills. Abdomen:   Abdomen is soft, non-tender, non-distended.  No hernias, masses, or organomegaly  Normoactive bowel sounds. Musculoskeletal:   No cyanosis clubbing or edema. Skin:   No rashes, lesions, ulcers  palpation of skin: no induration or nodules Neurologic:   Patient is unable to cooperate with exam. Psychiatric:  Patient is unable to cooperate with exam.  I have personally reviewed the following:   Today's Data   Vitals, BMP  Other Data   EEG negative.  LP negative  Scheduled Meds:  enoxaparin (LOVENOX) injection  40 mg Subcutaneous Q24H   ferrous sulfate  325 mg Oral Q breakfast   folic acid  1 mg Oral Daily   LORazepam  1 mg Intravenous QHS   metoprolol tartrate  7.5 mg Intravenous Q6H   nicotine  21 mg Transdermal Q0600   nystatin  5 mL Oral QID   sodium chloride flush  10-40 mL Intracatheter Q12H   thiamine  100 mg Oral Daily   Or   thiamine  100 mg Intravenous Daily   Continuous Infusions:  dextrose 5 % and 0.45% NaCl 150 mL/hr at 07/28/19 1247   feeding supplement (JEVITY 1.2  CAL)      Principal Problem:   Altered mental status Active Problems:   Rhabdomyolysis   Sinus tachycardia   Suicidal ideation   Psychosis ([place])   LOS: 22 days   A & P  Decreased level of consciousness/agitation:  Low grade fever yesterday. Antipsychotics stopped. No infectious etiology has been determined. Neurology ordered MRI which was unremarkable. LP was negative for infectious causes. No nuchal rigidity/meningeal signs to suggest meningitis. Autoimmune work up on CSF is pending. Neurology has had the patient on ativan 1 mg qid. The patient's mother has stated that she does not want her daughter to receive this medication. I have discontinued it. I have discussed the patient with Dr. Mariea Clonts. She has evaluated the  patient. I appreciate her assistance. Both neurology and psychiatry have recommended Ativan protocol, but the mother is refusing this. They have both also recommended ECT, but the mother has refused this as well. The patient's mother wishes for the patient to be discharged to the [facility] in [place]. They have declined to accept the patient, because she is not taking PO. I have discussed the patient with [facility] today at the request of the patient's mother regarding transfer. I spoke with a Dr. Amil Amen. I explained to Dr. Amil Amen the patient's presentation and ongoing mental status. I have explained to him the patient's work up and the recommendations of psychiatry and neurology (ativan protocol and ECT). I have explained to him that the patient has taken nothing by mouth for almost 2 weeks. I have also explained to him that the patient's mother has refused all of these interventions. Dr. Amil Amen said that he did not feel that [facility] had anything to add to the patient's work up and that simply transferring the patient for the family's convenience was not a consideration in the current environment when they are at 120% of capacity. I have consulted [name] to help up determine the goals of care with the mother. I will also discuss with case management discharge to another LTAC of rehab facility. I have explained to the mother my interaction with [facility]. I have also consulted Dr. Rhea Pink to assist in determinine goals of care. She saw the patient on 07/25/2019. After reading [name] account of their encounter and talking with the patient's mother today, I find theses two accounts very incongruent. [name] seems to indicate that the mother was saying that she would consider allowing therapies such as ECT and ativan protocol for the patient. The mother told [name] and I that [name] advised against ECT and ativan protocol. She also stated that [name] told  her that she would be calling [facility] and [facility] about a transfer. I do not believe this to be the case. On our visit today the patient's mother told [name] and I that she would think about allowing a feeding tube over the weekend. On 07/28/2019 the patient's mother has agreed to placement of feeding tube. Cortrak team will not be available until 07/30/2019. I have discussed the patient with IR, Radiology, Yoko ( the patient's nurse) and the charge nurse for 6E. IR states that they don't place cortraks, Radiology (Dr.Stroud) states that he will not attempt to place cortrak until the floor nurses has attempted it "several times", and floor nurses state that they will not put in cortrak and that only the [place] team can place it. They will not be available until Tuesday. I have placed an  order for NGT as I understand that nursing will place these on the floor, or at least attempt them.  Agitation/Combative behavior alternating with decreased level of consciousness.  Thrush: Nystatin ordered, however, I do not know if the patient will take it.  Rhabdomyolysis: Ongoing. Continue IV fluids. Likely due to agitation. No seizure activitiy on EEG. Mother refusing ativan.  Anorexia: The patient's mother has refused feeding tube placement or artificial nutrition. SLP has been asked to re-evaluate the patient. They are unable to evaluate the patient, because the patient will not cooperate with them. The patient remains NPO. I have ordered megace at the mother's request, but I doubt that the patient will take it. Mother is refusing insertion or use of feeding tube. I have emphasized to her the important of returning nutrition to the fluid and the fact that the patient would not have survived to this point without IV fluids with D5. She says that she will make a decision by Sunday. On 07/28/2019 the patient has agreed to placement of feeding tube. Cortrak team will not be available until 07/30/2019. I have  discussed the patient with IR, Radiology, Yoko ( the patient's nurse) and the charge nurse for 6E. IR states that they don't place cortraks, Radiology (Dr.Stroud) states that he will not attempt to place cortrak until the floor nurses has attempted it "several times", and floor nurses state that they will not put in cortrak and that only the [place] team can place it. They will not be available until Tuesday. I have placed an order for NGT as I understand that nursing will place these on the floor, or at least attempt them.  Hypernatremia/Hyperchloremia: Resolved with increased Iv fluid rate. Pt is taking no PO. Monitor. She is receiving D5 1/2 NS. Will follow electrolytes including magnesium and phosphorus, and supplement as necessary.  Hypokalemia: Resolved. 3.9 this morning. Supplement and monitor.  Abnormal LFTs: Likely related to rhabdomyolysis. Avoid hepatotoxic medications. No abdominal pain on examination. In the setting of rhabdomyolysis next-avoid hepatotoxic medications. Monitor and trend.  Persistent Sinus Tachycardia: Monitor on telemetry. Echocardiogram showed Normal EF. Metoprolol was increased, but the patient is refusing PO. She has been started on IV lopressor.Cardiology was consulted and Dr. Nechama Guard feels that her persistent rates within the 100s 120s are is likely secondary to rhabdo and agitation psych aortic issues.  There is no structural heart disease on TTE and likely the treatment will be addressing underlying cause and no further cardiac work-up is recommended at this time. TSH within normal limits.  Low grade fevers with sinus tachycardia: Resolved. Infectious cause? UA negative and urine cultures are negative thus far. Blood cultures negative CXR demonstrated only atelectasis. Consider possible withdrawal from substance? UDS positive only for benzoes. Dr. Lorraine Lax from neurology is consulting. I appreciate his help.   Suicidal Ideation, Agitation/Psychosis: The patient has  been evaluated by psychiatry.   Suicide precautions, sitter at bedside. Continue to Monitor. Continue 1:1 sitter.   Renal insufficiency/AKI, worsening slowly: Due to rhabdomyolysis and urinary retention and patient not accepting PO. Will increase IV fluids. Avoid nephrotoxic substances and hypotension. Monitor. 0.79. Today.  Renal ultrasound is negative. Foley catheter is in place.  Hyperphosphatemia: Due to AKI and poor hydration. IV fluids increased. Monitor.  Normocytic Anemia: Hemoglobin stable for last couple of days at 11.3 and 11.0. Monitor.Likely dilutional drop in the setting of IV fluid resuscitation. Checked anemia panel and patient's iron level was 25, U IBC was 304, TIBC was 329, saturation ratios were  8%, ferritin was 66, folate level was 17.9, and vitamin B12 level was 1033. Monitor.  Obesity:  Estimated body mass index is 36.31 kg/m as calculated from the following. Once recovered the patient should seek assistance from per PCP for a sensible weight loss plan.  The patient was seen and examined by me. I have spent 58 minutes in her evaluation and care. More than 50% of this has been spent in coordination of care with IR, Radiology, and nursing.  DVT prophylaxis: Enoxaparin 40 mg sq Daily  Code Status: FULL CODE  Family Communication: None available today. Disposition Plan: Inpatient psychiatry vs placement in rehab facility.   Veronica Girvan, DO [facility] Direct contact: see [email or web address]  7PM-7AM contact night coverage as above 07/28/2019, 3:46 PM  LOS: 4 days

## 2019-07-28 NOTE — Progress Notes (Addendum)
Spoke with Dr. Benny Lennert and told her that pt needed NG placed in fluro. Pt will not cooperate, unable to safely get correct placement at bedside. Cortrack team will not be here until Tuesday. Carroll Kinds RN

## 2019-07-29 LAB — GLUCOSE, CAPILLARY
Glucose-Capillary: 123 mg/dL — ABNORMAL HIGH (ref 70–99)
Glucose-Capillary: 92 mg/dL (ref 70–99)
Glucose-Capillary: 114 mg/dL — ABNORMAL HIGH (ref 70–99)
Glucose-Capillary: 98 mg/dL (ref 70–99)
Glucose-Capillary: 89 mg/dL (ref 70–99)

## 2019-07-29 NOTE — Progress Notes (Signed)
°PROGRESS NOTE ° °Veronica Arias MRN:[id number] DOB: 03/03/1991 DOA: 07/01/2019 °PCP: Patient, No Pcp Per ° °Brief History   °Veronica Arias is a 28 y.o. female with medical history significant of asthma who presented to the ED on 07/01/2019 for evaluation of suicidal ideation, paranoia, and bizarre behavior.  Patient had recent ED visit 2 days ago for similar behavior.  Per police report, she was found on the balcony of her apartment throwing things and saying she was suicidal and was going to jump.  On initial labs, no leukocytosis, blood ethanol level negative, acetaminophen level negative, salicylate level negative.  Transaminases elevated (AST 76, ALT 145), remainder of LFTs normal. Beta-hCG negative.  COVID 19 rapid test negative.  TSH normal.  UDS positive for benzodiazepines.  Labs done on 07/03/2019 showing CK 2998 and has continued to worsen since then.  On 8/13, CK 4536, blood glucose 63.  Blood glucose subsequently improved.  Today, CK uptrending 4011 > 4037.  Transaminases improved (AST 70, ALT 90).  Head CT done today showing no acute intracranial abnormality.  Patient has received a total of 6 L IV fluid boluses in the past 2 days.  However, CK continue's to trend up and patient is persistently tachycardic with heart rate up to 130s.  She has continued to be highly agitated throughout her ED stay for the past 4 days.Trying to pull out IV lines etc.  Refusing p.o.'s.  Patient has even managed to get out of soft restraints and has required security assistance to get her back to her room. Required multiple rounds of antipsychotics, benzodiazepines, diphenhydramine etc. for the past 4 days.  Due to CK uptrending, psychiatry recommended stopping antipsychotics/ Zyprexa as they may be causing rhabdo.  Accepted by inpatient psychiatry, but patient not taking PO. Mother refusing enteral feeds.   Internal medicine teaching service was called and declined hospital admission.  [facility] to admit patient for mgmt  of rhabdomyolysis, persistent tachycardia, agitation/paranoia/hallucinations. °  °On the evening of 07/09/2019 the patient had a rapid response called due to fever and tachycardia. She was moved to a telemetry floor. Mother is at bedside today and states that the patient was awake, but not herself earlier today. EEG negative for seizure.  ° °Neurology was consulted. MRI was negative. LP was performed under fluoro on 07/13/2019. It was negative for infection. Autoimmune work up is pending, and the patient is now undergoing a long term EEG. Autoimmune studies on CSF is pending.  ° °The patient's mother has refused feeding tube placement or artificial nutrition. SLP has been asked to re-evaluate the patient. They are unable to evaluate the patient, because the patient will not cooperate with them. The patient remains NPO. On 07/16/2019 the patient's mother also stated that she did not wish for the patient to receive any more ativan. The patient's mother wishes for the patient to be discharged to the [facility] in [place]. They have declined to accept the patient, because she is not taking PO. I have discussed the patient with [name] today at the request of the patient's mother regarding transfer. I spoke with a [name]. I explained to [name] the patient's presentation and ongoing mental status. I have explained to him the patient's work up and the recommendations of psychiatry and neurology (ativan protocol and ECT). I have explained to him that the patient has taken nothing by mouth for almost 2 weeks. I have also explained to him that the patient's mother has refused all of these interventions. [name] said that he did not feel that [facility] had anything to add to the patient's work up and that simply transferring the patient for the family's convenience was not a consideration in the   current environment when they are at 120% of capacity.   I have consulted [name] to  help up determine the goals of care with the mother. I will also discuss with case management discharge to another LTAC of rehab facility. I visited the patient and her mother with [name] today. The mother met with [name] on 07/25/2019. After reading [name] account of their encounter and talking with the patient's mother today, I find theses two accounts very incongruent.  On 07/28/2019 the patient has agreed to placement of feeding tube. Cortrak team will not be available until 07/30/2019. I have discussed the patient with IR, Radiology, Yoko ( the patient's nurse) and the charge nurse for 6E. IR states that they don't place cortraks, Radiology (Dr.Stroud) states that he will not attempt to place cortrak until the floor nurses has attempted it "several times", and floor nurses state that they will not put in cortrak and that only the [place] team can place it. They will not be available until Tuesday. I have placed an order for NGT as I understand that nursing will place these on the floor, or at least attempt them.  It has been reported to me that the patient ate her breakfast well. I was able to observe her eating lunch.  Consultants   Neurology  Psychiatry  Palliative care.  Procedures   EEG  LP  Antibiotics   Anti-infectives (From admission, onward)   Start     Dose/Rate Route Frequency Ordered Stop   07/09/19 1900  Ampicillin-Sulbactam (UNASYN) 3 g in sodium chloride 0.9 % 100 mL IVPB     3 g 200 mL/hr over 30 Minutes Intravenous Every 8 hours 07/09/19 1845 07/16/19 1904     Subjective  The patient is sitting up in bed eating lunch. She is not communicative. Objective   Vitals:  Vitals:   07/29/19 0357 07/29/19 1142  BP: 123/84 123/79  Pulse: 70 92  Resp: 18 18  Temp: 98.2 F (36.8 C) 98.5 F (36.9 C)  SpO2: 95% 95%    Exam:  Constitutional: The patient is lying quietly in bed with her eyes open. No acute distress. Respiratory:   No increased work of  breathing  No wheezes, rales, or rhonchi.  No tactile fremitus Cardiovascular:   Tachycardic at greater than 100 BPM.  No murmurs, ectopy or gallups.  No lateral PMI. No thrills. Abdomen:   Abdomen is soft, non-tender, non-distended.  No hernias, masses, or organomegaly  Normoactive bowel sounds. Musculoskeletal:   No cyanosis clubbing or edema. Skin:   No rashes, lesions, ulcers  palpation of skin: no induration or nodules Neurologic:   Patient is unable to cooperate with exam. Psychiatric:  Patient is unable to cooperate with exam.  I have personally reviewed the following:   Today's Data   Vitals, BMP  Other Data   EEG negative.  LP negative  Scheduled Meds:  enoxaparin (LOVENOX) injection  40 mg Subcutaneous Q24H   ferrous sulfate  325 mg Oral Q breakfast   folic acid  1 mg Oral Daily   LORazepam  1 mg Intravenous QHS   metoprolol tartrate  7.5 mg Intravenous Q6H   nicotine  21 mg Transdermal Q0600   nystatin  5 mL Oral QID   sodium chloride flush  10-40 mL Intracatheter Q12H   thiamine  100 mg Oral Daily   Or   thiamine  100 mg Intravenous Daily   Continuous Infusions:  dextrose 5 % and 0.45% NaCl  150 mL/hr at 07/29/19 1210    Principal Problem:   Altered mental status Active Problems:   Rhabdomyolysis   Sinus tachycardia   Suicidal ideation   Psychosis ([place])   LOS: 23 days   A & P  Decreased level of consciousness/agitation:  Low grade fever yesterday. Antipsychotics stopped. No infectious etiology has been determined. Neurology ordered MRI which was unremarkable. LP was negative for infectious causes. No nuchal rigidity/meningeal signs to suggest meningitis. Autoimmune work up on CSF is pending. Neurology has had the patient on ativan 1 mg qid. The patient's mother has stated that she does not want her daughter to receive this medication. I have discontinued it. I have discussed the patient with Dr. Mariea Clonts. She has evaluated  the patient. I appreciate her assistance. Both neurology and psychiatry have recommended Ativan protocol, but the mother is refusing this. They have both also recommended ECT, but the mother has refused this as well. The patient's mother wishes for the patient to be discharged to the [facility] in [place]. They have declined to accept the patient, because she is not taking PO. I have discussed the patient with [facility] today at the request of the patient's mother regarding transfer. I spoke with a Dr. Amil Amen. I explained to Dr. Amil Amen the patient's presentation and ongoing mental status. I have explained to him the patient's work up and the recommendations of psychiatry and neurology (ativan protocol and ECT). I have explained to him that the patient has taken nothing by mouth for almost 2 weeks. I have also explained to him that the patient's mother has refused all of these interventions. Dr. Amil Amen said that he did not feel that [facility] had anything to add to the patient's work up and that simply transferring the patient for the family's convenience was not a consideration in the current environment when they are at 120% of capacity. I have consulted [name] to help up determine the goals of care with the mother. I will also discuss with case management discharge to another LTAC of rehab facility. I have explained to the mother my interaction with [facility]. I have also consulted Dr. Rhea Pink to assist in determinine goals of care. She saw the patient on 07/25/2019. After reading [name] account of their encounter and talking with the patient's mother today, I find theses two accounts very incongruent. [name] seems to indicate that the mother was saying that she would consider allowing therapies such as ECT and ativan protocol for the patient. The mother told [name] and I that [name] advised against ECT and ativan protocol. She also stated that [name]  told her that she would be calling [facility] and [facility] about a transfer. I do not believe this to be the case. On our visit today the patient's mother told [name] and I that she would think about allowing a feeding tube over the weekend. On 07/28/2019 the patient's mother has agreed to placement of feeding tube. Cortrak team will not be available until 07/30/2019. I have discussed the patient with IR, Radiology, Yoko ( the patient's nurse) and the charge nurse for 6E. IR states that they don't place cortraks, Radiology (Dr.Stroud) states that he will not attempt to place cortrak until the floor nurses has attempted it "several times", and floor nurses state that they will not put in cortrak and that only the [place] team can place it. They will not be available until Tuesday. I have  placed an order for NGT as I understand that nursing will place these on the floor, or at least attempt them.  Agitation/Combative behavior alternating with decreased level of consciousness.  Thrush: Nystatin ordered, however, I do not know if the patient will take it.  Rhabdomyolysis: Ongoing. Continue IV fluids. Likely due to agitation. No seizure activitiy on EEG. Mother refusing ativan.  Anorexia: The patient's mother has refused feeding tube placement or artificial nutrition. SLP has been asked to re-evaluate the patient. They are unable to evaluate the patient, because the patient will not cooperate with them. The patient remains NPO. I have ordered megace at the mother's request, but I doubt that the patient will take it. Mother is refusing insertion or use of feeding tube. I have emphasized to her the important of returning nutrition to the fluid and the fact that the patient would not have survived to this point without IV fluids with D5. She says that she will make a decision by Sunday. On 07/28/2019 the patient has agreed to placement of feeding tube. Cortrak team will not be available until 07/30/2019. I have  discussed the patient with IR, Radiology, Yoko ( the patient's nurse) and the charge nurse for 6E. IR states that they don't place cortraks, Radiology (Dr.Stroud) states that he will not attempt to place cortrak until the floor nurses has attempted it "several times", and floor nurses state that they will not put in cortrak and that only the [place] team can place it. They will not be available until Tuesday. I have placed an order for NGT as I understand that nursing will place these on the floor, or at least attempt them. However, today the patient is eating. Hopefully this will obviate the need for a feeding tube.  Hypernatremia/Hyperchloremia: Resolved with increased Iv fluid rate. Pt is taking no PO. Monitor. She is receiving D5 1/2 NS. Will follow electrolytes including magnesium and phosphorus, and supplement as necessary.  Hypokalemia: Resolved. 3.9 this morning. Supplement and monitor.  Abnormal LFTs: Likely related to rhabdomyolysis. Avoid hepatotoxic medications. No abdominal pain on examination. In the setting of rhabdomyolysis next-avoid hepatotoxic medications. Monitor and trend.  Persistent Sinus Tachycardia: Monitor on telemetry. Echocardiogram showed Normal EF. Metoprolol was increased, but the patient is refusing PO. She has been started on IV lopressor.Cardiology was consulted and Dr. Nechama Guard feels that her persistent rates within the 100s 120s are is likely secondary to rhabdo and agitation psych aortic issues.  There is no structural heart disease on TTE and likely the treatment will be addressing underlying cause and no further cardiac work-up is recommended at this time. TSH within normal limits.  Low grade fevers with sinus tachycardia: Resolved. Infectious cause? UA negative and urine cultures are negative thus far. Blood cultures negative CXR demonstrated only atelectasis. Consider possible withdrawal from substance? UDS positive only for benzoes. Dr. Lorraine Lax from neurology is  consulting. I appreciate his help.   Suicidal Ideation, Agitation/Psychosis: The patient has been evaluated by psychiatry.   Suicide precautions, sitter at bedside. Continue to Monitor. Continue 1:1 sitter.   Renal insufficiency/AKI, worsening slowly: Due to rhabdomyolysis and urinary retention and patient not accepting PO. Will increase IV fluids. Avoid nephrotoxic substances and hypotension. Monitor. 0.79. Today.  Renal ultrasound is negative. Foley catheter is in place.  Hyperphosphatemia: Due to AKI and poor hydration. IV fluids increased. Monitor.  Normocytic Anemia: Hemoglobin stable for last couple of days at 11.3 and 11.0. Monitor.Likely dilutional drop in the setting of IV fluid resuscitation. Checked  anemia panel and patient's iron level was 25, U IBC was 304, TIBC was 329, saturation ratios were 8%, ferritin was 66, folate level was 17.9, and vitamin B12 level was 1033. Monitor.  Obesity:  Estimated body mass index is 36.31 kg/m as calculated from the following. Once recovered the patient should seek assistance from per PCP for a sensible weight loss plan.  The patient was seen and examined by me. I have spent 35 in her evaluation and care.  DVT prophylaxis: Enoxaparin 40 mg sq Daily  Code Status: FULL CODE  Family Communication: None available today. Disposition Plan: Inpatient psychiatry vs placement in rehab facility.   Farheen Pfahler, DO [facility] Direct contact: see [email or web address]  7PM-7AM contact night coverage as above 07/29/2019, 3:29 PM  LOS: 4 days

## 2019-07-29 NOTE — Progress Notes (Signed)
Physical Therapy Treatment Patient Details Name: Veronica Arias MRN: [id number] DOB: 09/23/1991 Today's Date: 07/29/2019    History of Present Illness 28 y.o. female with medical history significant of asthma who presented to the ED on 07/01/2019 for evaluation of suicidal ideation, paranoia, and bizarre behavior. 07/09/19 rapid response called for fever and tachycardia, developed rhabdomyolysis.     PT Comments    Pt awake and visually tracking on arrival. Pt non verbal throughout session with some response via head nod/shake. Pt walked to sink and looked at self in mirror with large grin and progressed gait to include hall ambulation with guarding for lines and safety. Pt distracted by objects in hallway pulling star cards off wall to look at but would not verbalize thoughts. Pt performed HEP with increased time and educated for continued need to increase mobility and eat. Sitter present beginning and end of session. Will continue to follow.    Follow Up Recommendations  Other (comment)([facility])     Equipment Recommendations  Rolling walker with 5" wheels    Recommendations for Other Services       Precautions / Restrictions Precautions Precautions: Fall    Mobility  Bed Mobility Overal bed mobility: Needs Assistance Bed Mobility: Supine to Sit     Supine to sit: Supervision     General bed mobility comments: pt able to roll to right with cues and rail for pericare prior to exiting bed. Pt then able to rise to EOB with cues for initiation without physical assist with increased time  Transfers Overall transfer level: Needs assistance   Transfers: Sit to/from Stand Sit to Stand: Min guard         General transfer comment: guarding for safety and lines with pt able to stand from bed without assist, cues for positioning and safety prior to sitting in chair  Ambulation/Gait Ambulation/Gait assistance: Min guard Gait Distance (Feet): 350 Feet Assistive device:  Rolling walker (2 wheeled) Gait Pattern/deviations: Step-through pattern;Decreased stride length   Gait velocity interpretation: 1.31 - 2.62 ft/sec, indicative of limited community ambulator General Gait Details: cues for safety pt hitting obstacle x 2, cues for direction and use of RW. pt stopping x 2 to shake hands as if painful but would not reply to how her hands felt or if she needed anything. Pt with good stability with RW throughout gait and good stride with decreased speed   Stairs             Wheelchair Mobility    Modified Rankin (Stroke Patients Only)       Balance Overall balance assessment: Mild deficits observed, not formally tested                                          Cognition Arousal/Alertness: Awake/alert Behavior During Therapy: Flat affect Overall Cognitive Status: Difficult to assess                                 General Comments: pt non verbal throughout session. Awake and visually tracking in room and hall. She would shake and nod head at times but not consistently. Following commands grossly 75% of the time with increased time. Pt stopping at gas shut off and star board in hall to read signs but without verbalization      Exercises General Exercises -  Lower Extremity Long Arc Quad: AROM;Both;Seated;15 reps Hip Flexion/Marching: AROM;Both;Seated;10 reps    General Comments        Pertinent Vitals/Pain Pain Assessment: No/denies pain    Home Living                      Prior Function            PT Goals (current goals can now be found in the care plan section) Acute Rehab PT Goals PT Goal Formulation: Patient unable to participate in goal setting Time For Goal Achievement: 08/12/19 Potential to Achieve Goals: Fair Progress towards PT goals: Progressing toward goals;Goals met and updated - see care plan    Frequency    Min 2X/week      PT Plan Current plan remains appropriate     Co-evaluation              AM-PAC PT "6 Clicks" Mobility   Outcome Measure  Help needed turning from your back to your side while in a flat bed without using bedrails?: A Little Help needed moving from lying on your back to sitting on the side of a flat bed without using bedrails?: A Little Help needed moving to and from a bed to a chair (including a wheelchair)?: A Little Help needed standing up from a chair using your arms (e.g., wheelchair or bedside chair)?: A Little Help needed to walk in hospital room?: A Little Help needed climbing 3-5 steps with a railing? : A Lot 6 Click Score: 17    End of Session Equipment Utilized During Treatment: Gait belt Activity Tolerance: Patient tolerated treatment well Patient left: in chair;with call bell/phone within reach;with nursing/sitter in room Nurse Communication: Mobility status PT Visit Diagnosis: Other abnormalities of gait and mobility (R26.89);Muscle weakness (generalized) (M62.81);Other symptoms and signs involving the nervous system (R29.898)     Time: 8333-8329 PT Time Calculation (min) (ACUTE ONLY): 24 min  Charges:  $Gait Training: 8-22 mins $Therapeutic Activity: 8-22 mins                     [place], PT Acute Rehabilitation Services Pager: [phone] Office: [place] 07/29/2019, 1:30 PM

## 2019-07-29 NOTE — Progress Notes (Signed)
RN was called into pt's room, pt is getting agitated, trying to get out of bed. Her mother requested for her to have some med to help her relax/ take a nap. Haldol given. Will continue to monitor pt.

## 2019-07-29 NOTE — Plan of Care (Signed)
  Problem: Clinical Measurements: Goal: Will remain free from infection Outcome: Progressing Note:  No s/s of infection noted. Goal: Respiratory complications will improve Outcome: Progressing Note:  No s/s of respiratory complications noted.   

## 2019-07-29 NOTE — Progress Notes (Signed)
Nutrition Follow-up  DOCUMENTATION CODES:   Obesity unspecified  INTERVENTION:   Monitor magnesium, potassium, and phosphorus daily for at least 3 days, MD to replete as needed, as pt is at risk for refeeding syndrome givenpt inadequate intake since admission, current NPO status.  Once tube is placed: Recommend initiate Osmolite 1.5 @ 20 ml/hr; Advance 10 ml every 24 hrs to goal rate of 60 ml/hr -Prostat 30 ml daily via tube -MVI daily via tube -Tube feed regimen at goal rate provides 2260 kcals, 105 grams protein, 1094 ml free water  NUTRITION DIAGNOSIS:   Inadequate oral intake related to inability to eat as evidenced by NPO status.  Now on dysphagia 3 diet  GOAL:   Patient will meet greater than or equal to 90% of their needs  Not meeting.  MONITOR:   TF tolerance, I & O's, Labs, Weight trends  REASON FOR ASSESSMENT:   Consult Enteral/tube feeding initiation and management  ASSESSMENT:   28 year old female with medical history significant of asthma who presented to ED on 8/10 for evaluation of suicidal ideation, paranoia, and bizarre behavior with recent ED visit 2 days prior for similar behavior. Per police report; pt found on the balcony of her apartment throwing things, stating she was suicidal and was going to jump. UDS positive for benzodiazepines.  Patient admitted with rhabdomyolysis, persistent tachycardia, agitation/paranoia/hallucinations.  8/26- advanced to dysphagia 1 diet 8/28- pt mom refused cortrak tube placement  **RD working remotely**  Per chart review, pt remains non-communicative.  Per MD note, pt's mother now agreeable to feeding tube. Per nursing note today, pt's mother refused bedside placement.  Cortrak placement available tomorrow 9/8. Will leave TF recommendations above if tube is placed.  Patient consuming "bites" of meals at this time.  Patient is high risk of refeeding syndrome given prolonged poor PO intakes.  I/Os: -3.2L since  8/24 UOP 9/6: 3225 ml  Medications: Ferrous sulfate tablet daily, Folic acid tablet daily, Thiamine tablet daily, D5 infusion Labs reviewed:  CBGs: 92-123  Diet Order:   Diet Order            DIET DYS 3 Room service appropriate? Yes with Assist; Fluid consistency: Thin  Diet effective now              EDUCATION NEEDS:   No education needs have been identified at this time  Skin:  Skin Assessment: Reviewed RN Assessment  Last BM:  07/15/19  Height:   Ht Readings from Last 1 Encounters:  07/06/19 5\' 3"  (1.6 m)    Weight:   Wt Readings from Last 1 Encounters:  07/26/19 85.7 kg    Ideal Body Weight:  52.3 kg  BMI:  Body mass index is 33.48 kg/m.  Estimated Nutritional Needs:   Kcal:  1800-2000  Protein:  90-105 grams  Fluid:  > 1.8 L  Clayton Bibles, MS, RD, LDN Inpatient Clinical Dietitian Pager: [phone] After Hours Pager: [phone]

## 2019-07-29 NOTE — Progress Notes (Signed)
Pt ate 1 bite of potatoes, 3 bites of grit for her breakfast. Her mother at bedside, RN explained RN can try to put in NG tube before Cortrak team Alashia [place]field it 9/8. Mother refused.

## 2019-07-30 LAB — NOVEL CORONAVIRUS, NAA (HOSP ORDER, SEND-OUT TO REF LAB; TAT 18-24 HRS): SARS-CoV-2, NAA: NOT DETECTED

## 2019-07-30 LAB — GLUCOSE, CAPILLARY
Glucose-Capillary: 100 mg/dL — ABNORMAL HIGH (ref 70–99)
Glucose-Capillary: 105 mg/dL — ABNORMAL HIGH (ref 70–99)
Glucose-Capillary: 109 mg/dL — ABNORMAL HIGH (ref 70–99)
Glucose-Capillary: 115 mg/dL — ABNORMAL HIGH (ref 70–99)
Glucose-Capillary: 130 mg/dL — ABNORMAL HIGH (ref 70–99)
Glucose-Capillary: 133 mg/dL — ABNORMAL HIGH (ref 70–99)
Glucose-Capillary: 91 mg/dL (ref 70–99)

## 2019-07-30 MED ORDER — LIP MEDEX EX OINT
TOPICAL_OINTMENT | CUTANEOUS | Status: DC | PRN
  Filled 2019-07-30: qty 7

## 2019-07-30 NOTE — Progress Notes (Signed)
SLP Cancellation Note  Patient Details Name: Veronica Arias MRN: [id number] DOB: 1991/07/29   Cancelled treatment:       Reason Eval/Treat Not Completed: Other (comment) Pt was sleeping soundly upon SLP arrival. Her mother was present and asked that she not be aroused at this time. She does say that she has been eating more - at least several bites at a meal but also having a small amount of snacks - and that she has been having no difficulty swallowing. SLP will f/u briefly for tolerance.   Venita Sheffield Sebron Mcmahill 07/30/2019, 12:28 PM  Pollyann Glen, M.A. [place] Acute Environmental education officer [phone] Office [phone]

## 2019-07-30 NOTE — Progress Notes (Signed)
PROGRESS NOTE  Tessi Eustache OMB:[id number] DOB: [birth year] DOA: 07/01/2019 PCP: Patient, No Pcp Per  Brief History   Asja Stimpsonis a 28 y.o.femalewith medical history significant ofasthma who presented to the ED on 07/01/2019 for evaluation of suicidal ideation, paranoia, and bizarre behavior. Patient had recent ED visit 2 days ago for similar behavior. Per police report, she was found on the balcony of her apartment throwing things and saying she was suicidal and was going to jump. On initial labs, no leukocytosis, blood ethanol level negative, acetaminophen level negative, salicylate level negative.Transaminases elevated (AST 76, ALT 145), remainder of LFTs normal. Beta-hCG negative. COVID 19 rapid test negative. TSH normal. UDS positive for benzodiazepines. Labs done on 07/03/2019 showing CK 2998 and has continued to worsen since then. On 8/13, CK 4536, blood glucose 63. Blood glucose subsequently improved. Today, CK uptrending 4011 >4037. Transaminases improved (AST 70, ALT 90). Head CT done today showing no acute intracranial abnormality. Patient has received a total of 6 L IV fluid boluses in the past 2 days. However, CK continue's to trend up and patient is persistently tachycardic with heart rate up to 130s. She has continued to be highly agitated throughout her ED stay for the past 4 days.Trying to pull out IV lines etc. Refusing p.o.'s. Patient has even managed to get out of soft restraints and has required security assistance to get her back to her room. Required multiple rounds of antipsychotics,benzodiazepines, diphenhydramineetc. for the past 4 days.Due to CK uptrending, psychiatry recommended stopping antipsychotics/ Zyprexaas they may be causing rhabdo. Accepted by inpatient psychiatry, but patient not taking PO. Mother refusing enteral feeds. Internal medicine teaching service wascalledand declined hospital admission. [facility] callledto admit patient formgmt  of rhabdomyolysis, persistent tachycardia, agitation/paranoia/hallucinations.  On the evening of 07/09/2019 the patient had a rapid response called due to fever and tachycardia. She was moved to a telemetry floor. Mother is at bedside today and states that the patient was awake, but not herself earlier today. EEG negative for seizure.   Neurology was consulted. MRI was negative. LP was performed under fluoro on 07/13/2019. It was negative for infection. Autoimmune work up is pending, and the patient is now undergoing a long term EEG. Autoimmune studies on CSF is pending.   The patient's mother has refused feeding tube placement or artificial nutrition. SLP has been asked to re-evaluate the patient. They are unable to evaluate the patient, because the patient will not cooperate with them. The patient remains NPO. On 07/16/2019 the patient's mother also stated that she did not wish for the patient to receive any more ativan. The patient's mother wishes for the patient to be discharged to the [facility] in [place]. They have declined to accept the patient, because she is not taking PO. I have discussed the patient with [facility] today at the request of the patient's mother regarding transfer. I spoke with a Dr. Amil Amen. I explained to Dr. Amil Amen the patient's presentation and ongoing mental status. I have explained to him the patient's work up and the recommendations of psychiatry and neurology (ativan protocol and ECT). I have explained to him that the patient has taken nothing by mouth for almost 2 weeks. I have also explained to him that the patient's mother has refused all of these interventions. Dr. Amil Amen said that he did not feel that [facility] had anything to add to the patient's work up and that simply transferring the patient for the family's convenience was not a consideration in  the current environment when they are at 120% of capacity.   I have consulted [name] to  help up determine the goals of care with the mother. I will also discuss with case management discharge to another LTAC of rehab facility. I visited the patient and her mother with [name] today. The mother met with [name] on 07/25/2019. After reading [name] account of their encounter and talking with the patient's mother today, I find theses two accounts very incongruent.  On 07/28/2019 the patient has agreed to placement of feeding tube. Cortrak team will not be available until 07/30/2019. I have discussed the patient with IR, Radiology, Yoko ( the patient's nurse) and the charge nurse for 6E. IR states that they don't place cortraks, Radiology (Dr.Stroud) states that he will not attempt to place cortrak until the floor nurses has attempted it "several times", and floor nurses state that they will not put in cortrak and that only the [place] team can place it. They will not be available until Tuesday. I have placed an order for NGT as I understand that nursing will place these on the floor, or at least attempt them.  Since 07/29/2019 nursing has been reporting that the patient is eating about 25% of her meals. Will hold off on Cortak for now.  Consultants   Neurology  Psychiatry  Palliative care.  Procedures   EEG  LP  Antibiotics   Anti-infectives (From admission, onward)   Start     Dose/Rate Route Frequency Ordered Stop   07/09/19 1900  Ampicillin-Sulbactam (UNASYN) 3 g in sodium chloride 0.9 % 100 mL IVPB     3 g 200 mL/hr over 30 Minutes Intravenous Every 8 hours 07/09/19 1845 07/16/19 1904     Subjective  The patient is sitting up in bed eating lunch. She is not communicative. Objective   Vitals:  Vitals:   07/30/19 1423 07/30/19 1607  BP: 121/63 (!) 142/63  Pulse:  76  Resp:    Temp:  98.4 F (36.9 C)  SpO2:  100%    Exam:  Constitutional: The patient is lying quietly in bed with her eyes closed. No acute distress. Respiratory:   No increased work of  breathing  No wheezes, rales, or rhonchi.  No tactile fremitus Cardiovascular:   Tachycardic at greater than 100 BPM.  No murmurs, ectopy or gallups.  No lateral PMI. No thrills. Abdomen:   Abdomen is soft, non-tender, non-distended.  No hernias, masses, or organomegaly  Normoactive bowel sounds. Musculoskeletal:   No cyanosis clubbing or edema. Skin:   No rashes, lesions, ulcers  palpation of skin: no induration or nodules Neurologic:   Patient is unable to cooperate with exam. Psychiatric:  Patient is unable to cooperate with exam.  I have personally reviewed the following:   Today's Data   Vitals, BMP  Other Data   EEG negative.  LP negative  Scheduled Meds:  enoxaparin (LOVENOX) injection  40 mg Subcutaneous Q24H   ferrous sulfate  325 mg Oral Q breakfast   folic acid  1 mg Oral Daily   LORazepam  1 mg Intravenous QHS   metoprolol tartrate  7.5 mg Intravenous Q6H   nicotine  21 mg Transdermal Q0600   nystatin  5 mL Oral QID   sodium chloride flush  10-40 mL Intracatheter Q12H   thiamine  100 mg Oral Daily   Or   thiamine  100 mg Intravenous Daily   Continuous Infusions:  dextrose 5 % and 0.45%  150 mL/hr at 07/30/19 1619  ° ° °Principal Problem: °  Altered mental status °Active Problems: °  Rhabdomyolysis °  Sinus tachycardia °  Suicidal ideation °  Psychosis (HCC) ° ° LOS: 24 days  ° °A & P  °Decreased level of consciousness/agitation:  Low grade fever yesterday. Antipsychotics stopped. No infectious etiology has been determined. Neurology ordered MRI which was unremarkable. LP was negative for infectious causes. No nuchal rigidity/meningeal signs to suggest meningitis. Autoimmune work up on CSF is pending. Neurology has had the patient on ativan 1 mg qid. The patient's mother has stated that she does not want her daughter to receive this medication. I have discontinued it. I have discussed the patient with Dr. Norman. She has evaluated  the patient. I appreciate her assistance. Both neurology and psychiatry have recommended Ativan protocol, but the mother is refusing this. They have both also recommended ECT, but the mother has refused this as well. The patient's mother wishes for the patient to be discharged to the [facility] in [place]. They have declined to accept the patient, because she is not taking PO. I have discussed the patient with [name] today at the request of the patient's mother regarding transfer. I spoke with a [name]. I explained to [name] the patient's presentation and ongoing mental status. I have explained to him the patient's work up and the recommendations of psychiatry and neurology (ativan protocol and ECT). I have explained to him that the patient has taken nothing by mouth for almost 2 weeks. I have also explained to him that the patient's mother has refused all of these interventions. [name] said that he did not feel that [facility] had anything to add to the patient's work up and that simply transferring the patient for the family's convenience was not a consideration in the current environment when they are at 120% of capacity. I have consulted Dr. Golding to help up determine the goals of care with the mother. I will also discuss with case management discharge to another LTAC of rehab facility. I have explained to the mother my interaction with [facility]. I have also consulted Dr. Beth Golding to assist in determinine goals of care. She saw the patient on 07/25/2019. After reading Dr. Golding's account of their encounter and talking with the patient's mother today, I find theses two accounts very incongruent. Dr. Golding seems to indicate that the mother was saying that she would consider allowing therapies such as ECT and ativan protocol for the patient. The mother told Dr. Memon and I that Dr. Golding advised against ECT and ativan protocol. She also stated that Dr. Golding  told her that she would be calling [facility] and [facility] about a transfer. I do not believe this to be the case. On our visit today the patient's mother told Dr. Memon and I that she would think about allowing a feeding tube over the weekend. On 07/28/2019 the patient's mother has agreed to placement of feeding tube. Cortrak team will not be available until 07/30/2019. I have discussed the patient with IR, Radiology, Yoko ( the patient's nurse) and the charge nurse for 6E. IR states that they don't place cortraks, Radiology (Dr.Stroud) states that he will not attempt to place cortrak until the floor nurses has attempted it "several times", and floor nurses state that they will not put in cortrak and that only the Cortrak team can place it. They will not be available until Tuesday. However, for   the last 2 days the patient has been consuming about 25% of her meals. Will hold off on Cortrak for now. ° °Agitation/Combative behavior alternating with decreased level of consciousness/catatonia. ° °Thrush: Nystatin ordered, however, I do not know if the patient will take it. ° °Rhabdomyolysis: Ongoing. Continue IV fluids. Likely due to agitation. No seizure activitiy on EEG. Mother refusing ativan. °  °Anorexia: The patient's mother has refused feeding tube placement or artificial nutrition. SLP has been asked to re-evaluate the patient. They are unable to evaluate the patient, because the patient will not cooperate with them. The patient remains NPO. I have ordered megace at the mother's request, but I doubt that the patient will take it. Mother is refusing insertion or use of feeding tube. I have emphasized to her the important of returning nutrition to the fluid and the fact that the patient would not have survived to this point without IV fluids with D5. She says that she will make a decision by Sunday. On 07/28/2019 the patient has agreed to placement of feeding tube. Cortrak team will not be available until  07/30/2019. I have discussed the patient with IR, Radiology, Yoko ( the patient's nurse) and the charge nurse for 6E. IR states that they don't place cortraks, Radiology (Dr.Stroud) states that he will not attempt to place cortrak until the floor nurses has attempted it "several times", and floor nurses state that they will not put in cortrak and that only the Cortrak team can place it. They will not be available until Tuesday.However, for the last 2 days the patient has been consuming about 25% of her meals. Will hold off on Cortrak for now. Hopefully this will obviate the need for a feeding tube. ° °Hypernatremia/Hyperchloremia: Resolved with increased Iv fluid rate. Pt is taking no PO. Monitor. She is receiving D5 1/2 NS. Will follow electrolytes including magnesium and phosphorus, and supplement as necessary. ° °Hypokalemia: Resolved. 3.9 this morning. Supplement and monitor. °  °Abnormal LFTs: Likely related to rhabdomyolysis. Avoid hepatotoxic medications. No abdominal pain on examination. In the setting of rhabdomyolysis next-avoid hepatotoxic medications. Monitor and trend. °  °Persistent Sinus Tachycardia: Monitor on telemetry. Echocardiogram showed Normal EF. Metoprolol was increased, but the patient is refusing PO. She has been started on IV lopressor.Cardiology was consulted and Dr. Shuman feels that her persistent rates within the 100s 120s are is likely secondary to rhabdo and agitation psych aortic issues.  There is no structural heart disease on TTE and likely the treatment will be addressing underlying cause and no further cardiac work-up is recommended at this time. TSH within normal limits. ° °Low grade fevers with sinus tachycardia: Resolved. Infectious cause? UA negative and urine cultures are negative thus far. Blood cultures negative CXR demonstrated only atelectasis. Consider possible withdrawal from substance? UDS positive only for benzoes. Dr. Aroor from neurology is consulting. I appreciate  his help.  °  °Suicidal Ideation, Agitation/Psychosis: The patient has been evaluated by psychiatry.   Suicide precautions, sitter at bedside. Continue to Monitor. Continue 1:1 sitter.  ° °Renal insufficiency/AKI, worsening slowly: Due to rhabdomyolysis and urinary retention and patient not accepting PO. Will increase IV fluids. Avoid nephrotoxic substances and hypotension. Monitor. 0.79. Today.  Renal ultrasound is negative. Foley catheter is in place. °  °Hyperphosphatemia: Due to AKI and poor hydration. IV fluids increased. Monitor. °  °Normocytic Anemia: Hemoglobin stable for last couple of days at 11.3 and 11.0. Monitor.Likely dilutional drop in the setting of IV fluid resuscitation. Checked anemia panel and patient's iron level was   25, U IBC was 304, TIBC was 329, saturation ratios were 8%, ferritin was 66, folate level was 17.9, and vitamin B12 level was 1033. Monitor. °  °Obesity:  Estimated body mass index is 36.31 kg/m² as calculated from the following. Once recovered the patient should seek assistance from per PCP for a sensible weight loss plan. ° °The patient was seen and examined by me. I have spent 32 in her evaluation and care. °  °DVT prophylaxis: Enoxaparin 40 mg sq Daily  °Code Status: FULL CODE  °Family Communication: None available today. °Disposition Plan: Inpatient psychiatry vs placement in rehab facility.  ° ° , DO °[facility] °Direct contact: see [email or web address]  °7PM-7AM contact night coverage as above °07/30/2019, 6:50 PM  LOS: 4 days  ° ° ° ° ° ° °

## 2019-07-30 NOTE — Progress Notes (Signed)
Occupational Therapy Treatment Patient Details Name: Veronica BaileyKyla Acton MRN: [id number] DOB: 03-Nov-1991 Today's Date: 07/30/2019    History of present illness 28 y.o. female with medical history significant of asthma who presented to the ED on 07/01/2019 for evaluation of suicidal ideation, paranoia, and bizarre behavior. 07/09/19 rapid response called for fever and tachycardia, developed rhabdomyolysis.    OT comments  Pt progressing toward stated goals, focused session on BADL mobility progression. Pt was agitated from being awakened from nap, but ultimately participatory. Pt completed functional mobility in the hallway, unable to find room without cues. Pt following one step commands with increased time. She was agreeable to sit in chair at end of session. RN reports she was alert and engaged in BADLs this AM and was agitated in PM last evening as well. Will continue to follow acutely. D/c recs remain appropriate.    Follow Up Recommendations  Supervision/Assistance - 24 hour    Equipment Recommendations  3 in 1 bedside commode    Recommendations for Other Services      Precautions / Restrictions Precautions Precautions: Fall Restrictions Weight Bearing Restrictions: No       Mobility Bed Mobility Overal bed mobility: Needs Assistance Bed Mobility: Supine to Sit     Supine to sit: Supervision     General bed mobility comments: supervision for safety and management of lines, pt with decreaed awareness of all lines  Transfers Overall transfer level: Needs assistance Equipment used: Rolling walker (2 wheeled) Transfers: Sit to/from Stand           General transfer comment: min guard for safety    Balance Overall balance assessment: Mild deficits observed, not formally tested                                         ADL either performed or assessed with clinical judgement   ADL Overall ADL's : Needs assistance/impaired                                        General ADL Comments: focused session on BADL mobility progression, pt not willing to engage in other BADL due to agitation     Vision Baseline Vision/History: Wears glasses Wears Glasses: At all times Patient Visual Report: No change from baseline     Perception     Praxis      Cognition Arousal/Alertness: Awake/alert Behavior During Therapy: Flat affect Overall Cognitive Status: Difficult to assess                                 General Comments: pt selectively mute during session, following one step commands with increased time. Agitated from being woken up by staff member stating "why is everyone saying the same shit all the time"        Exercises     Shoulder Instructions       General Comments      Pertinent Vitals/ Pain       Pain Assessment: No/denies pain  Home Living  Prior Functioning/Environment              Frequency  Min 2X/week        Progress Toward Goals  OT Goals(current goals can now be found in the care plan section)  Progress towards OT goals: Progressing toward goals  Acute Rehab OT Goals Patient Stated Goal: none stated OT Goal Formulation: With patient Time For Goal Achievement: 08/02/19  Plan Discharge plan remains appropriate;Frequency remains appropriate    Co-evaluation                 AM-PAC OT "6 Clicks" Daily Activity     Outcome Measure   Help from another person eating meals?: A Little Help from another person taking care of personal grooming?: A Little Help from another person toileting, which includes using toliet, bedpan, or urinal?: A Little Help from another person bathing (including washing, rinsing, drying)?: A Little Help from another person to put on and taking off regular upper body clothing?: A Little Help from another person to put on and taking off regular lower body clothing?: A Little 6 Click  Score: 18    End of Session Equipment Utilized During Treatment: Gait belt;Rolling walker  OT Visit Diagnosis: Unsteadiness on feet (R26.81);Pain;Muscle weakness (generalized) (M62.81);Other symptoms and signs involving cognitive function   Activity Tolerance Patient tolerated treatment well   Patient Left in chair;with nursing/sitter in room;with call bell/phone within reach   Nurse Communication Mobility status        Time: 8850-2774 OT Time Calculation (min): 13 min  Charges: OT General Charges $OT Visit: 1 Visit OT Treatments $Therapeutic Activity: 8-22 mins  Zenovia Jarred, MSOT, OTR/L [place] OT/ Acute Relief OT [facility] Office: [place] 07/30/2019, 4:59 PM

## 2019-07-31 DIAGNOSIS — R748 Abnormal levels of other serum enzymes: Secondary | ICD-10-CM

## 2019-07-31 LAB — PHOSPHORUS: Phosphorus: 4 mg/dL (ref 2.5–4.6)

## 2019-07-31 LAB — COMPREHENSIVE METABOLIC PANEL
ALT: 58 U/L — ABNORMAL HIGH (ref 0–44)
AST: 29 U/L (ref 15–41)
Albumin: 3 g/dL — ABNORMAL LOW (ref 3.5–5.0)
Alkaline Phosphatase: 53 U/L (ref 38–126)
Anion gap: 8 (ref 5–15)
BUN: <5 mg/dL — ABNORMAL LOW (ref 6–20)
CO2: 26 mmol/L (ref 22–32)
Calcium: 8.8 mg/dL — ABNORMAL LOW (ref 8.9–10.3)
Chloride: 104 mmol/L (ref 98–111)
Creatinine, Ser: 0.91 mg/dL (ref 0.44–1.00)
GFR calc Af Amer: >60 mL/min (ref >60)
GFR calc non Af Amer: >60 mL/min (ref >60)
Glucose, Bld: 105 mg/dL — ABNORMAL HIGH (ref 70–99)
Potassium: 3.3 mmol/L — ABNORMAL LOW (ref 3.5–5.1)
Sodium: 138 mmol/L (ref 135–145)
Total Bilirubin: 0.2 mg/dL — ABNORMAL LOW (ref 0.3–1.2)
Total Protein: 6.4 g/dL — ABNORMAL LOW (ref 6.5–8.1)

## 2019-07-31 LAB — CBC WITH DIFFERENTIAL/PLATELET
Abs Immature Granulocytes: 0.05 10*3/uL (ref 0.00–0.07)
Basophils Absolute: 0.1 10*3/uL (ref 0.0–0.1)
Basophils Relative: 1 %
Eosinophils Absolute: 0.1 10*3/uL (ref 0.0–0.5)
Eosinophils Relative: 3 %
HCT: 30.1 % — ABNORMAL LOW (ref 36.0–46.0)
Hemoglobin: 9.6 g/dL — ABNORMAL LOW (ref 12.0–15.0)
Immature Granulocytes: 1 %
Lymphocytes Relative: 33 %
Lymphs Abs: 1.7 10*3/uL (ref 0.7–4.0)
MCH: 26.6 pg (ref 26.0–34.0)
MCHC: 31.9 g/dL (ref 30.0–36.0)
MCV: 83.4 fL (ref 80.0–100.0)
Monocytes Absolute: 0.5 10*3/uL (ref 0.1–1.0)
Monocytes Relative: 10 %
Neutro Abs: 2.8 10*3/uL (ref 1.7–7.7)
Neutrophils Relative %: 52 %
Platelets: 347 10*3/uL (ref 150–400)
RBC: 3.61 MIL/uL — ABNORMAL LOW (ref 3.87–5.11)
RDW: 12.8 % (ref 11.5–15.5)
WBC: 5.2 10*3/uL (ref 4.0–10.5)
nRBC: 0 % (ref 0.0–0.2)

## 2019-07-31 LAB — GLUCOSE, CAPILLARY
Glucose-Capillary: 118 mg/dL — ABNORMAL HIGH (ref 70–99)
Glucose-Capillary: 90 mg/dL (ref 70–99)
Glucose-Capillary: 96 mg/dL (ref 70–99)
Glucose-Capillary: 92 mg/dL (ref 70–99)
Glucose-Capillary: 109 mg/dL — ABNORMAL HIGH (ref 70–99)

## 2019-07-31 LAB — MAGNESIUM: Magnesium: 1.5 mg/dL — ABNORMAL LOW (ref 1.7–2.4)

## 2019-07-31 MED ORDER — MAGNESIUM SULFATE 2 GM/50ML IV SOLN
2.0000 g | Freq: Once | INTRAVENOUS | Status: AC
  Administered 2019-08-01: 01:00:00 2 g via INTRAVENOUS
  Filled 2019-07-31: qty 50

## 2019-07-31 MED ORDER — POTASSIUM CHLORIDE CRYS ER 20 MEQ PO TBCR
40.0000 meq | EXTENDED_RELEASE_TABLET | ORAL | Status: DC
  Administered 2019-08-01: 04:00:00 40 meq via ORAL
  Filled 2019-07-31: qty 2

## 2019-07-31 NOTE — Progress Notes (Signed)
Pt woke up verbalizing " I'm hungry". Alert oriented x 4, calm and cooperative. Pt was able to finish the chicken sandwich, a cup of grapes, bowl of cheerios with milk and half a cup of warm prune juice. Pt tolerated it well without difficulty. Will continue to monitor pt.

## 2019-07-31 NOTE — Progress Notes (Signed)
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  She was discharged from [facility] psych ED after improvement with diazepam.  Decompensated 2 days later and brought to ED again on 8/10.  She required multiple dose of antipsychotics in ED. Developed rhabdo, AKI and transaminitis thought to be due to antipsychotics. CK elevated to 3000> 4500. AST 76.  ALT 145.  CBC normal.  COVID-19, pregnancy test, ethanol, Tylenol and salicylate level negative.  UDS positive for benzo.  Patient was admitted.  Zyprexa discontinued due to rhabdo.     On 8/18-rapid response due to fever and tachycardia.  Mental status change and ongoing refusal to take p.o. 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  Neurology consulted and had extensive work-up including CT head, MRI head, EEG and CSF without explanation.   9/3-palliative care consulted and met with the patient and patient's mother and had an extensive discussion about treatment options including eternal feeding, ECT, transfer to other facility ([facility], [facility], [facility])  Obtained for transfer by previous provider was not successful.  Finally family agreed to placing cortrack but patient started taking p.o.  Subjective: No major events overnight of this morning.  She is somehow drowsy.  She is sitting in bed eating Mac Cheese with the help of NT.  [name] at bedside reports improvement in her p.o. intake.  She thinks she is drowsy because of the medicine she was given this morning.  [name] has no concerns at this time.  Patient does not appear to be in distress.  She follows some command.  Objective: Vitals:   07/31/19 0350 07/31/19 0750 07/31/19 1115 07/31/19 1538  BP: 123/69  123/84 129/84 (!) 155/112  Pulse: 78 76 86 95  Resp: 20 15 18 18  Temp:  98.6 F (37 C) 98.2 F (36.8 C) 98.5 F (36.9 C)  TempSrc:  Oral Oral Oral  SpO2: 100% 100% 100% 100%  Weight: 82.1 kg     Height:        Intake/Output Summary (Last 24 hours) at 07/31/2019 1747 Last data filed at 07/31/2019 1742 Gross per 24 hour  Intake 1200 ml  Output 2930 ml  Net -1730 ml   Filed Weights   07/24/19 0418 07/26/19 0400 07/31/19 0350  Weight: 86.2 kg 85.7 kg 82.1 kg    Examination:  GENERAL: No acute distress.  Sitting in bed eating breakfast with the help of NT HEENT: MMM.  Vision and hearing grossly intact.  NECK: Supple.  No apparent JVD.  RESP:  No IWOB. Good air movement bilaterally. CVS:  RRR. Heart sounds normal.  ABD/GI/GU: Bowel sounds present. Soft. Non tender.  MSK/EXT:  Moves extremities. No apparent deformity or edema.  SKIN: no apparent skin lesion or wound NEURO: Drowsy.  Follows some command.  Moves all extremities.  Patellar reflex symmetric. PSYCH: Calm. Normal affect.   Assessment & Plan: Acute psychosis with positive and negative symptoms and suicidal ideation Acute metabolic encephalopathy/catatonia -Continues to look catatonic and drowsy.  She does not respond but minimally follows command. -On PRN haloperidol-no obvious extraparametal symptoms. -Safety sitter -Assistance with feeding -May involve psych again for guidance on antipsychotics.  Rhabdomyolysis: Improved but started rising.  -Recheck CK in the morning  Elevated   liver enzymes: Resolving.  Likely due to rhabdomyolysis. -Monitor as appropriate  Hyponatremia/hypochloremia: Resolved.  Hypokalemia/hypomagnesemia: Refeeding syndrome? -Replenish and recheck. -Phosphorus within normal range.  Sinus tachycardia: Resolved.  AKI: Resolved.  Likely prerenal in the setting of poor p.o. intake and ATN from rhabdo.  Normocytic anemia: Hgb more or less stable. -Check anemia panel in the morning   Obesity: BMI 32. -We will encourage weight loss.  DVT prophylaxis: Subcu Lovenox Code Status: Full code Family Communication: Updated patient's mother at bedside. Disposition Plan: Remains inpatient Consultants: Psychiatry, neurology, palliative  Procedures:  None  Microbiology summarized: [id number] negative. Blood cultures negative. CSF cultures negative. Urine culture negative.  Antimicrobials: Anti-infectives (From admission, onward)   Start     Dose/Rate Route Frequency Ordered Stop   07/09/19 1900  Ampicillin-Sulbactam (UNASYN) 3 g in sodium chloride 0.9 % 100 mL IVPB     3 g 200 mL/hr over 30 Minutes Intravenous Every 8 hours 07/09/19 1845 07/16/19 1904      Sch Meds:  Scheduled Meds: . enoxaparin (LOVENOX) injection  40 mg Subcutaneous Q24H  . ferrous sulfate  325 mg Oral Q breakfast  . folic acid  1 mg Oral Daily  . LORazepam  1 mg Intravenous QHS  . metoprolol tartrate  7.5 mg Intravenous Q6H  . nicotine  21 mg Transdermal Q0600  . nystatin  5 mL Oral QID  . sodium chloride flush  10-40 mL Intracatheter Q12H  . thiamine  100 mg Oral Daily   Or  . thiamine  100 mg Intravenous Daily   Continuous Infusions: . dextrose 5 % and 0.45% NaCl 150 mL/hr at 07/31/19 1542   PRN Meds:.acetaminophen, alum & mag hydroxide-simeth, haloperidol lactate, hydrALAZINE, lip balm, magnesium hydroxide, sodium chloride flush   I have personally reviewed the following labs and images: CBC: Recent Labs  Lab 07/26/19 1053 07/31/19 0845  WBC 5.1 5.2  NEUTROABS 2.7 2.8  HGB 9.8* 9.6*  HCT 30.7* 30.1*  MCV 82.1 83.4  PLT 451* 347   BMP &GFR Recent Labs  Lab 07/25/19 0703 07/26/19 1053 07/27/19 0502 07/28/19 1140 07/31/19 0845  NA  --  138 138 139 138  K  --  3.1* 3.2* 3.9 3.3*  CL  --  104 105 104 104  CO2  --  _0 GLUCOSE  --  111* 122* 100* 105*  BUN  --  <5* <5* <5* <5*  CREATININE 0.72 0.75 0.82 0.71 0.91  CALCIUM  --  9.1 9.0 9.5 8.8*  MG  --   --    --   --  1.5*  PHOS  --   --   --   --  4.0   Estimated Creatinine Clearance: 95.1 mL/min (by C-G formula based on SCr of 0.91 mg/dL). Liver & Pancreas: Recent Labs  Lab 07/31/19 0845  AST 29  ALT 58*  ALKPHOS 53  BILITOT 0.2*  PROT 6.4*  ALBUMIN 3.0*   No results for input(s): LIPASE, AMYLASE in the last 168 hours. No results for input(s): AMMONIA in the last 168 hours. Diabetic: No results for input(s): HGBA1C in the last 72 hours. Recent Labs  Lab 07/30/19 2307 07/31/19 0327 07/31/19 0801 07/31/19 1148 07/31/19 1630  GLUCAP 91 118* 90 96 92   Cardiac Enzymes: No results for input(s): CKTOTAL, CKMB, CKMBINDEX, TROPONINI in the last 168 hours. No results for input(s): PROBNP in the last 8760 hours. Coagulation Profile: No results for input(s): INR, PROTIME in the last 168  hours. Thyroid Function Tests: No results for input(s): TSH, T4TOTAL, FREET4, T3FREE, THYROIDAB in the last 72 hours. Lipid Profile: No results for input(s): CHOL, HDL, LDLCALC, TRIG, CHOLHDL, LDLDIRECT in the last 72 hours. Anemia Panel: No results for input(s): VITAMINB12, FOLATE, FERRITIN, TIBC, IRON, RETICCTPCT in the last 72 hours. Urine analysis:    Component Value Date/Time   COLORURINE YELLOW 07/09/2019 1115   APPEARANCEUR CLEAR 07/09/2019 1115   LABSPEC 1.006 07/09/2019 1115   PHURINE 5.0 07/09/2019 1115   GLUCOSEU NEGATIVE 07/09/2019 1115   HGBUR MODERATE (A) 07/09/2019 1115   BILIRUBINUR NEGATIVE 07/09/2019 1115   KETONESUR NEGATIVE 07/09/2019 1115   PROTEINUR NEGATIVE 07/09/2019 1115   NITRITE NEGATIVE 07/09/2019 1115   LEUKOCYTESUR NEGATIVE 07/09/2019 1115   Sepsis Labs: Invalid input(s): PROCALCITONIN, [place]  Microbiology: Recent Results (from the past 240 hour(s))  Novel Coronavirus, NAA (hospital order; send-out to ref lab)     Status: None   Collection Time: 07/29/19 10:52 AM   Specimen: Nasopharyngeal Swab; Respiratory  Result Value Ref Range Status    SARS-CoV-2, NAA NOT DETECTED NOT DETECTED Final    Comment: (NOTE) This nucleic acid amplification test was developed and its performance characteristics determined by [employer]. Nucleic acid amplification tests include PCR and TMA. This test has not been FDA cleared or approved. This test has been authorized by FDA under an Emergency Use Authorization (EUA). This test is only authorized for the duration of time the declaration that circumstances exist justifying the authorization of the emergency use of in vitro diagnostic tests for detection of SARS-CoV-2 virus and/or diagnosis of COVID-19 infection under section 564(b)(1) of the Act, 21 U.S.C. 106YIR-4(W) (1), unless the authorization is terminated or revoked sooner. When diagnostic testing is negative, the possibility of a false negative result should be considered in the context of a patient's recent exposures and the presence of clinical signs and symptoms consistent with COVID-19. An individual without symptoms of COVID- 19 and who is not shedding SARS-CoV-2 vi rus would expect to have a negative (not detected) result in this assay. Performed At: [facility] [address] [place], [place] [id number] Rush Farmer MD KX:[id number]    [place]  Final    Comment: Performed at [facility], [place] [address] [place], [place] [address]    Radiology Studies: No results found.  35 minutes with more than 50% spent in reviewing records, counseling patient and coordinating care.  Yoseph Haile T. [place]  If 7PM-7AM, please contact night-coverage [email or web address] Password [facility] 07/31/2019, 5:47 PM

## 2019-07-31 NOTE — Progress Notes (Signed)
Pt continuously trying to get out of bed when awake

## 2019-07-31 NOTE — Progress Notes (Signed)
Pt got out of bed with no direction and defecated in the floor. Pt cleaned and linens changed. Pt put back to bed, pt trying to stand in bed and throwing legs over side rails.

## 2019-07-31 NOTE — Progress Notes (Signed)
Pt mouth suctioned per mothers request

## 2019-08-01 LAB — COMPREHENSIVE METABOLIC PANEL
ALT: 62 U/L — ABNORMAL HIGH (ref 0–44)
AST: 35 U/L (ref 15–41)
Albumin: 3 g/dL — ABNORMAL LOW (ref 3.5–5.0)
Alkaline Phosphatase: 54 U/L (ref 38–126)
Anion gap: 10 (ref 5–15)
BUN: <5 mg/dL — ABNORMAL LOW (ref 6–20)
CO2: 25 mmol/L (ref 22–32)
Calcium: 8.7 mg/dL — ABNORMAL LOW (ref 8.9–10.3)
Chloride: 104 mmol/L (ref 98–111)
Creatinine, Ser: 0.9 mg/dL (ref 0.44–1.00)
GFR calc Af Amer: >60 mL/min (ref >60)
GFR calc non Af Amer: >60 mL/min (ref >60)
Glucose, Bld: 129 mg/dL — ABNORMAL HIGH (ref 70–99)
Potassium: 3.2 mmol/L — ABNORMAL LOW (ref 3.5–5.1)
Sodium: 139 mmol/L (ref 135–145)
Total Bilirubin: 0.5 mg/dL (ref 0.3–1.2)
Total Protein: 6.4 g/dL — ABNORMAL LOW (ref 6.5–8.1)

## 2019-08-01 LAB — GLUCOSE, CAPILLARY
Glucose-Capillary: 102 mg/dL — ABNORMAL HIGH (ref 70–99)
Glucose-Capillary: 104 mg/dL — ABNORMAL HIGH (ref 70–99)
Glucose-Capillary: 112 mg/dL — ABNORMAL HIGH (ref 70–99)
Glucose-Capillary: 117 mg/dL — ABNORMAL HIGH (ref 70–99)
Glucose-Capillary: 76 mg/dL (ref 70–99)
Glucose-Capillary: 85 mg/dL (ref 70–99)
Glucose-Capillary: 97 mg/dL (ref 70–99)

## 2019-08-01 LAB — IRON AND TIBC
Iron: 44 ug/dL (ref 28–170)
Saturation Ratios: 12 % (ref 10.4–31.8)
TIBC: 364 ug/dL (ref 250–450)
UIBC: 320 ug/dL

## 2019-08-01 LAB — MAGNESIUM: Magnesium: 2 mg/dL (ref 1.7–2.4)

## 2019-08-01 LAB — VITAMIN B12: Vitamin B-12: 410 pg/mL (ref 180–914)

## 2019-08-01 LAB — RETICULOCYTES
Immature Retic Fract: 11.2 % (ref 2.3–15.9)
RBC.: 4.11 MIL/uL (ref 3.87–5.11)
Retic Count, Absolute: 87.1 10*3/uL (ref 19.0–186.0)
Retic Ct Pct: 2.1 % (ref 0.4–3.1)

## 2019-08-01 LAB — PHOSPHORUS: Phosphorus: 4.6 mg/dL (ref 2.5–4.6)

## 2019-08-01 LAB — FERRITIN: Ferritin: 27 ng/mL (ref 11–307)

## 2019-08-01 LAB — FOLATE: Folate: 16.9 ng/mL (ref >5.9)

## 2019-08-01 LAB — CK: Total CK: 372 U/L — ABNORMAL HIGH (ref 38–234)

## 2019-08-01 MED ORDER — LORAZEPAM 1 MG PO TABS
1.0000 mg | ORAL_TABLET | Freq: Two times a day (BID) | ORAL | Status: DC | PRN
  Administered 2019-08-02 – 2019-08-05 (×4): 1 mg via ORAL
  Filled 2019-08-01 (×5): qty 1

## 2019-08-01 MED ORDER — POTASSIUM CHLORIDE CRYS ER 20 MEQ PO TBCR
40.0000 meq | EXTENDED_RELEASE_TABLET | ORAL | Status: DC
  Filled 2019-08-01: qty 2

## 2019-08-01 MED ORDER — METOPROLOL TARTRATE 12.5 MG HALF TABLET
12.5000 mg | ORAL_TABLET | Freq: Two times a day (BID) | ORAL | Status: DC
  Administered 2019-08-01 – 2019-08-05 (×8): 12.5 mg via ORAL
  Filled 2019-08-01 (×9): qty 1

## 2019-08-01 MED ORDER — LORAZEPAM 2 MG/ML IJ SOLN
1.0000 mg | Freq: Once | INTRAMUSCULAR | Status: AC
  Administered 2019-08-01: 1 mg via INTRAVENOUS
  Filled 2019-08-01: qty 1

## 2019-08-01 MED ORDER — POTASSIUM CHLORIDE 10 MEQ/100ML IV SOLN
10.0000 meq | INTRAVENOUS | Status: AC
  Administered 2019-08-01 (×4): 10 meq via INTRAVENOUS
  Filled 2019-08-01 (×4): qty 100

## 2019-08-01 MED ORDER — KCL IN DEXTROSE-NACL 20-5-0.45 MEQ/L-%-% IV SOLN
INTRAVENOUS | Status: DC
  Administered 2019-08-01 – 2019-08-03 (×5): via INTRAVENOUS
  Filled 2019-08-01 (×5): qty 1000

## 2019-08-01 NOTE — Progress Notes (Signed)
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  She was discharged from [facility] ED after improvement with diazepam.  Decompensated 2 days later and brought to ED again on 8/10.  She required multiple dose of antipsychotics in ED. Developed rhabdo, AKI and transaminitis thought to be due to antipsychotics. CK elevated to 3000> 4500. AST 76.  ALT 145.  CBC normal.  COVID-19, pregnancy test, ethanol, Tylenol and salicylate level negative.  UDS positive for benzo.  Patient was admitted.  Zyprexa discontinued due to rhabdo.     On 8/18-rapid response due to fever and tachycardia.  Mental status change and ongoing refusal to take p.o. 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  Neurology consulted and had extensive work-up including CT head, MRI head, EEG and CSF without explanation.   9/3-palliative care consulted and met with the patient and patient's mother and had an extensive discussion about treatment options including eternal feeding, ECT, transfer to other facility ([facility], [facility], [facility])  Obtained for transfer by previous provider was not successful.  Finally family agreed to placing cortrack but patient started taking p.o.  Subjective: No major events overnight of this morning.  Reportedly had good p.o. intake earlier this morning.  She finished [place] sandwich, small bag of baked potato chips and a cup of ginger ale about 4:30 AM this morning.  She is sleepy this morning.  Safety sitter at bedside.  [name] is concerned why patient is on nicotine patch.  On review of the chart, she never smoked cigarettes.  She has been getting nicotine patch since admission.  I have apologized about the incident to  patient's mother.  [name] also asking for CSW and medical record.  I advised her to call the medical record office.  RN will help find CSW.   Objective: Vitals:   08/01/19 0037 08/01/19 0528 08/01/19 0724 08/01/19 1227  BP: 121/64 (!) 151/77 134/70 119/73  Pulse:  69 70 69  Resp:  '18 16 15  '$ Temp:   98.5 F (36.9 C) 98 F (36.7 C)  TempSrc:   Axillary Axillary  SpO2:  100% 99% 100%  Weight:      Height:        Intake/Output Summary (Last 24 hours) at 08/01/2019 1347 Last data filed at 08/01/2019 0942 Gross per 24 hour  Intake 480 ml  Output 1751 ml  Net -1271 ml   Filed Weights   07/24/19 0418 07/26/19 0400 07/31/19 0350  Weight: 86.2 kg 85.7 kg 82.1 kg    Examination:  GENERAL: No acute distress.  Sleepy but opens eyes and responds to voice.  Follows some command but does not respond to questions. HEENT: MMM.  Vision and hearing grossly intact.  NECK: Supple.  No apparent JVD.  RESP:  No IWOB. Good air movement bilaterally. CVS:  RRR. Heart sounds normal.  ABD/GI/GU: Bowel sounds present. Soft. Non tender.  MSK/EXT:  Moves extremities. No apparent deformity or edema.  SKIN: no apparent skin lesion or wound NEURO: Awake, alert and oriented appropriately.  No gross deficit.  PSYCH: Calm. Normal affect.   Assessment & Plan: Acute psychosis with positive and negative symptoms and suicidal ideation Acute metabolic encephalopathy/catatonia? -Encourage p.o. intake-this is improving. -On  PRN haloperidol-no obvious extraparametal symptoms. -Continue safety sitter and assistance with feeding -Reconsult psych for guidance  Rhabdomyolysis: Improved.  CK down to 372. -Reduce IV fluid to 75 cc an hour.  Elevated liver enzymes: Resolving.  Likely due to rhabdomyolysis. -Monitor as appropriate  Hyponatremia/hypochloremia: Resolved.  Hypokalemia: Likely due to IV fluid. -Replenish and recheck. -Magnesium and phosphorus within normal range.  Sinus tachycardia:  Resolved. -Changed IV metoprolol to p.o.  AKI: Resolved. Likely a combination of prerenal and ATN from poor p.o. intake and rhabdo respectively.  Normocytic anemia: Hgb more or less stable. -Check anemia panel  Obesity: BMI 32. -We will encourage weight loss.  DVT prophylaxis: Subcu Lovenox Code Status: Full code Family Communication: Updated patient's mother over the phone.  [name] questioned about why patient has been getting nicotine patch.  Per patient's mother, patient never smoked.  Apologized about the incident and discontinue the nicotine patch. Disposition Plan: Remains inpatient Consultants: Psychiatry, neurology (signed off), palliative  Procedures:  None  Microbiology summarized: [id number] negative. Blood cultures negative. CSF cultures negative. Urine culture negative.  Antimicrobials: Anti-infectives (From admission, onward)   Start     Dose/Rate Route Frequency Ordered Stop   07/09/19 1900  Ampicillin-Sulbactam (UNASYN) 3 g in sodium chloride 0.9 % 100 mL IVPB     3 g 200 mL/hr over 30 Minutes Intravenous Every 8 hours 07/09/19 1845 07/16/19 1904      Sch Meds:  Scheduled Meds:  enoxaparin (LOVENOX) injection  40 mg Subcutaneous Q24H   ferrous sulfate  325 mg Oral Q breakfast   folic acid  1 mg Oral Daily   metoprolol tartrate  12.5 mg Oral BID   nystatin  5 mL Oral QID   sodium chloride flush  10-40 mL Intracatheter Q12H   thiamine  100 mg Oral Daily   Or   thiamine  100 mg Intravenous Daily   Continuous Infusions:  dextrose 5 % and 0.45 % NaCl with KCl 20 mEq/L     potassium chloride     PRN Meds:.acetaminophen, alum & mag hydroxide-simeth, haloperidol lactate, hydrALAZINE, lip balm, LORazepam, magnesium hydroxide, sodium chloride flush   I have personally reviewed the following labs and images: CBC: Recent Labs  Lab 07/26/19 1053 07/31/19 0845  WBC 5.1 5.2  NEUTROABS 2.7 2.8  HGB 9.8* 9.6*  HCT 30.7* 30.1*  MCV 82.1  83.4  PLT 451* 347   BMP &GFR Recent Labs  Lab 07/26/19 1053 07/27/19 0502 07/28/19 1140 07/31/19 0845 08/01/19 0500  NA 138 138 139 138 139  K 3.1* 3.2* 3.9 3.3* 3.2*  CL 104 105 104 104 104  CO2 '24 25 27 26 25  '$ GLUCOSE 111* 122* 100* 105* 129*  BUN <5* <5* <5* <5* <5*  CREATININE 0.75 0.82 0.71 0.91 0.90  CALCIUM 9.1 9.0 9.5 8.8* 8.7*  MG  --   --   --  1.5* 2.0  PHOS  --   --   --  4.0 4.6   Estimated Creatinine Clearance: 96.2 mL/min (by C-G formula based on SCr of 0.9 mg/dL). Liver & Pancreas: Recent Labs  Lab 07/31/19 0845 08/01/19 0500  AST 29 35  ALT 58* 62*  ALKPHOS 53 54  BILITOT 0.2* 0.5  PROT 6.4* 6.4*  ALBUMIN 3.0* 3.0*   No results for input(s): LIPASE, AMYLASE in the last 168 hours. No results for input(s): AMMONIA in the last 168 hours. Diabetic: No results for input(s): HGBA1C in the last 72 hours. Recent Labs  Lab  07/31/19 2134 08/01/19 0034 08/01/19 0358 08/01/19 0722 08/01/19 1318  GLUCAP 109* 112* 102* 117* 76   Cardiac Enzymes: Recent Labs  Lab 08/01/19 0500  CKTOTAL 372*   No results for input(s): PROBNP in the last 8760 hours. Coagulation Profile: No results for input(s): INR, PROTIME in the last 168 hours. Thyroid Function Tests: No results for input(s): TSH, T4TOTAL, FREET4, T3FREE, THYROIDAB in the last 72 hours. Lipid Profile: No results for input(s): CHOL, HDL, LDLCALC, TRIG, CHOLHDL, LDLDIRECT in the last 72 hours. Anemia Panel: No results for input(s): VITAMINB12, FOLATE, FERRITIN, TIBC, IRON, RETICCTPCT in the last 72 hours. Urine analysis:    Component Value Date/Time   COLORURINE YELLOW 07/09/2019 1115   APPEARANCEUR CLEAR 07/09/2019 1115   LABSPEC 1.006 07/09/2019 1115   PHURINE 5.0 07/09/2019 1115   GLUCOSEU NEGATIVE 07/09/2019 1115   HGBUR MODERATE (A) 07/09/2019 1115   BILIRUBINUR NEGATIVE 07/09/2019 1115   KETONESUR NEGATIVE 07/09/2019 1115   PROTEINUR NEGATIVE 07/09/2019 1115   NITRITE NEGATIVE  07/09/2019 1115   LEUKOCYTESUR NEGATIVE 07/09/2019 1115   Sepsis Labs: Invalid input(s): PROCALCITONIN, [place]  Microbiology: Recent Results (from the past 240 hour(s))  Novel Coronavirus, NAA (hospital order; send-out to ref lab)     Status: None   Collection Time: 07/29/19 10:52 AM   Specimen: Nasopharyngeal Swab; Respiratory  Result Value Ref Range Status   SARS-CoV-2, NAA NOT DETECTED NOT DETECTED Final    Comment: (NOTE) This nucleic acid amplification test was developed and its performance characteristics determined by [employer] and [employer]. Nucleic acid amplification tests include PCR and TMA. This test has not been FDA cleared or approved. This test has been authorized by FDA under an Emergency Use Authorization (EUA). This test is only authorized for the duration of time the declaration that circumstances exist justifying the authorization of the emergency use of in vitro diagnostic tests for detection of SARS-CoV-2 virus and/or diagnosis of COVID-19 infection under section 564(b)(1) of the Act, 21 U.S.C. 309MMH-6(K) (1), unless the authorization is terminated or revoked sooner. When diagnostic testing is negative, the possibility of a false negative result should be considered in the context of a patient's recent exposures and the presence of clinical signs and symptoms consistent with COVID-19. An individual without symptoms of COVID- 19 and who is not shedding SARS-CoV-2 vi rus would expect to have a negative (not detected) result in this assay. Performed At: [facility] [address] [place], [place] [id number] Rush Farmer MD XY:[id number]    [place]  Final    Comment: Performed at [facility], [place] [address] [place], [place] [address]    Radiology Studies: No results found.  Aliscia Clayton T. [place]  If 7PM-7AM, please contact night-coverage [email or web address] Password [email or web address] 08/01/2019, 1:47 PM

## 2019-08-01 NOTE — Progress Notes (Signed)
  Speech Language Pathology Treatment: Dysphagia  Patient Details Name: Veronica Arias MRN: [id number] DOB: [birth year] Today's Date: 08/01/2019 Time: 1201-1218 SLP Time Calculation (min) (ACUTE ONLY): 17 min  Assessment / Plan / Recommendation Clinical Impression  Patient was asleep during first attempt in the AM. However sitter reported patient ate a snack at 04:30 and tolerated it well. Second attempt to see patient was just after she required sedation. Nurse and patient's mother were present. They both felt patient tolerates PO and medication administration well, however she is not eating enough. At this time ST will sign off as patient appears to tolerate diet.    HPI HPI: 28 year old female who presented to the ED on 8/10 with acute onset of encephalopathy and suicidal ideation. She was found to have rhabdomyolysis as well as persistent tachycardia of unclear etiology. She has continued to deteriorate since admission, experiencing fever, diaphoresis, and catatonia.  Thus far all neurological tests are negative. DDx includes autoimmune encephalitis and resolving NMS. Pt has been evaluated by psychiatry; they plan to admit to inpt psych after medically stable.       SLP Plan  Discharge SLP treatment due to (comment)(goals met)       Recommendations  Diet recommendations: Dysphagia 3 (mechanical soft);Thin liquid Liquids provided via: Cup;Straw Medication Administration: Whole meds with liquid Supervision: Full supervision/cueing for compensatory strategies;Patient able to self feed Compensations: Minimize environmental distractions;Slow rate;Small sips/bites Postural Changes and/or Swallow Maneuvers: Seated upright 90 degrees                Plan: Discharge SLP treatment due to (comment)(goals met)       [place], MA, CCC-SLP 08/01/2019 12:22 PM

## 2019-08-01 NOTE — TOC Progression Note (Signed)
Transition of Care [facility]) - Progression Note    Patient Details  Name: Veronica Arias MRN: [id number] Date of Birth: [birth year]  Transition of Care [facility]) CM/SW [place], [place] Phone Number: 08/01/2019, 1:18 PM  Clinical Narrative:   CSW spoke with Gwyndolyn Saxon at [facility]. Patient's referral has been accepted, and patient is on the waitlist. CSW to continue to follow.    Expected Discharge Plan: IP Rehab Facility Barriers to Discharge: Continued Medical Work up(Referral sent to [facility])  Expected Discharge Plan and Services Expected Discharge Plan: [place]   Discharge Planning Services: CM Consult Post Acute Care Choice: NA Living arrangements for the past 2 months: Apartment                                       Social Determinants of Health (SDOH) Interventions    Readmission Risk Interventions No flowsheet data found.

## 2019-08-01 NOTE — TOC Progression Note (Signed)
Transition of Care [facility]) - Progression Note    Patient Details  Name: Veronica Arias MRN: [id number] Date of Birth: February 15, 1991  Transition of Care [facility]) CM/SW [place], [place] Phone Number: 08/01/2019, 1:18 PM  Clinical Narrative:   CSW completed application for [facility], faxed over clinicals and spoke with Baxter Flattery to confirm receipt. They will review referral and call back if able to accept.    Expected Discharge Plan: IP Rehab Facility Barriers to Discharge: Continued Medical Work up(Referral sent to [facility])  Expected Discharge Plan and Services Expected Discharge Plan: [place]   Discharge Planning Services: CM Consult Post Acute Care Choice: NA Living arrangements for the past 2 months: Apartment                                       Social Determinants of Health (SDOH) Interventions    Readmission Risk Interventions No flowsheet data found.

## 2019-08-01 NOTE — TOC Progression Note (Signed)
Transition of Care [facility]) - Progression Note    Patient Details  Name: Veronica Arias MRN: [id number] Date of Birth: 06-Feb-1991  Transition of Care [facility]) CM/SW [place], [place] Phone Number: 08/01/2019, 4:03 PM  Clinical Narrative:    Confirmed pt remains on wait list at [facility].   Expected Discharge Plan: [place] Barriers to Discharge: Continued Medical Work up(Referral sent to [facility])  Expected Discharge Plan and Services Expected Discharge Plan: [place]   Discharge Planning Services: CM Consult Post Acute Care Choice: NA Living arrangements for the past 2 months: Apartment    Social Determinants of Health (SDOH) Interventions    Readmission Risk Interventions No flowsheet data found.

## 2019-08-01 NOTE — Progress Notes (Signed)
Pt is combative, trying to pull the IV, pulling the leads off and wants to leave. Haldol IV prn given x 2 without relief. MD on call paged and ordered one time dose Lorazepam IV 1 mg. Will continue to monitor pt.

## 2019-08-01 NOTE — Progress Notes (Signed)
Physical Therapy Treatment Patient Details Name: Veronica Arias MRN: [id number] DOB: 1990-12-17 Today's Date: 08/01/2019    History of Present Illness 28 y.o. female with medical history significant of asthma who presented to the ED on 07/01/2019 for evaluation of suicidal ideation, paranoia, and bizarre behavior. 07/09/19 rapid response called for fever and tachycardia, developed rhabdomyolysis.     PT Comments    Pt able to progress ambulation today, obviously curious about the sights and sounds around her. Pt 3x stops and requires max verbal and tactile cues to progress. Pt continues to be nonverbal, not even nodding her head in response to questions today. Pt is supervision for bed mobility and min guard for transfers and most ambulation. Requires minA in form of HHA and tactile cuing to progress pt once stopped. D/c plans remain most appropriate for pt's recovery.   Follow Up Recommendations  Other (comment)([facility])     Equipment Recommendations  Rolling walker with 5" wheels       Precautions / Restrictions Precautions Precautions: Fall    Mobility  Bed Mobility Overal bed mobility: Needs Assistance Bed Mobility: Supine to Sit     Supine to sit: Supervision     General bed mobility comments: vc for side of bed to exit  Transfers Overall transfer level: Needs assistance   Transfers: Sit to/from Stand Sit to Stand: Min guard         General transfer comment: contact min guard for power up and steadying   Ambulation/Gait Ambulation/Gait assistance: Min guard;Min assist Gait Distance (Feet): 350 Feet Assistive device: None Gait Pattern/deviations: Step-through pattern;Decreased stride length Gait velocity: slowed Gait velocity interpretation: <1.31 ft/sec, indicative of household ambulator General Gait Details: mostly min guard for ambulation, Pt able to walk 100 feet with decreased velocity once at elevators pt stopped and requires maximal verbal and  tactile cuing to move again, ambulated 20 feet and stopped again this time she started bouncing up and down as if dancing to a beat, pt requires maximal tactile and verbal cuing to continue ambulation, then pt smiled and started walking again         Balance Overall balance assessment: Mild deficits observed, not formally tested                                          Cognition Arousal/Alertness: Awake/alert Behavior During Therapy: Flat affect Overall Cognitive Status: Difficult to assess                                 General Comments: Pt non verbal this session with the exception on return to room when she stated "Why you playing me like that." no further conversation. Pt looking at things in hallway like post it notes on a board and a picture of a humming bird reaching out to touch them. provided pt with a busy apron at end of session and pt was happy to unbutton and zip          General Comments  VSS      Pertinent Vitals/Pain Pain Assessment: Faces Faces Pain Scale: No hurt           PT Goals (current goals can now be found in the care plan section) Acute Rehab PT Goals PT Goal Formulation: Patient unable to participate in goal setting Time For Goal  Achievement: 08/12/19 Potential to Achieve Goals: Fair Progress towards PT goals: Progressing toward goals    Frequency    Min 2X/week      PT Plan Current plan remains appropriate       AM-PAC PT "6 Clicks" Mobility   Outcome Measure  Help needed turning from your back to your side while in a flat bed without using bedrails?: A Little Help needed moving from lying on your back to sitting on the side of a flat bed without using bedrails?: A Little Help needed moving to and from a bed to a chair (including a wheelchair)?: A Little Help needed standing up from a chair using your arms (e.g., wheelchair or bedside chair)?: A Little Help needed to walk in hospital room?: A  Little Help needed climbing 3-5 steps with a railing? : A Lot 6 Click Score: 17    End of Session Equipment Utilized During Treatment: Gait belt Activity Tolerance: Patient tolerated treatment well Patient left: in chair;with call bell/phone within reach;with nursing/sitter in room Nurse Communication: Mobility status PT Visit Diagnosis: Other abnormalities of gait and mobility (R26.89);Muscle weakness (generalized) (M62.81);Other symptoms and signs involving the nervous system (F57.322)     Time: 1455-1520 PT Time Calculation (min) (ACUTE ONLY): 25 min  Charges:  $Gait Training: 23-37 mins                     Dorrance Sellick B. Migdalia Dk PT, DPT Acute Rehabilitation Services Pager [phone] Office [phone]    [place] 08/01/2019, 4:47 PM

## 2019-08-01 NOTE — Progress Notes (Signed)
Pt was able to finish [place] sandwich, small bag of baked potato chips and a cup of ginger ale. Pt tolerated well.

## 2019-08-02 LAB — CBC
HCT: 32.8 % — ABNORMAL LOW (ref 36.0–46.0)
Hemoglobin: 10.2 g/dL — ABNORMAL LOW (ref 12.0–15.0)
MCH: 26.4 pg (ref 26.0–34.0)
MCHC: 31.1 g/dL (ref 30.0–36.0)
MCV: 84.8 fL (ref 80.0–100.0)
Platelets: 338 10*3/uL (ref 150–400)
RBC: 3.87 MIL/uL (ref 3.87–5.11)
RDW: 13 % (ref 11.5–15.5)
WBC: 5.3 10*3/uL (ref 4.0–10.5)
nRBC: 0 % (ref 0.0–0.2)

## 2019-08-02 LAB — BASIC METABOLIC PANEL
Anion gap: 9 (ref 5–15)
BUN: <5 mg/dL — ABNORMAL LOW (ref 6–20)
CO2: 24 mmol/L (ref 22–32)
Calcium: 9.4 mg/dL (ref 8.9–10.3)
Chloride: 105 mmol/L (ref 98–111)
Creatinine, Ser: 0.91 mg/dL (ref 0.44–1.00)
GFR calc Af Amer: >60 mL/min (ref >60)
GFR calc non Af Amer: >60 mL/min (ref >60)
Glucose, Bld: 103 mg/dL — ABNORMAL HIGH (ref 70–99)
Potassium: 3.8 mmol/L (ref 3.5–5.1)
Sodium: 138 mmol/L (ref 135–145)

## 2019-08-02 LAB — GLUCOSE, CAPILLARY
Glucose-Capillary: 103 mg/dL — ABNORMAL HIGH (ref 70–99)
Glucose-Capillary: 95 mg/dL (ref 70–99)
Glucose-Capillary: 111 mg/dL — ABNORMAL HIGH (ref 70–99)
Glucose-Capillary: 80 mg/dL (ref 70–99)
Glucose-Capillary: 102 mg/dL — ABNORMAL HIGH (ref 70–99)

## 2019-08-02 LAB — MAGNESIUM: Magnesium: 1.8 mg/dL (ref 1.7–2.4)

## 2019-08-02 LAB — PHOSPHORUS: Phosphorus: 4.3 mg/dL (ref 2.5–4.6)

## 2019-08-02 MED ORDER — HALOPERIDOL LACTATE 5 MG/ML IJ SOLN
2.0000 mg | Freq: Two times a day (BID) | INTRAMUSCULAR | Status: DC
  Administered 2019-08-02: 22:00:00 2 mg via INTRAMUSCULAR
  Filled 2019-08-02: qty 1

## 2019-08-02 MED ORDER — DIVALPROEX SODIUM 125 MG PO CSDR
250.0000 mg | DELAYED_RELEASE_CAPSULE | Freq: Two times a day (BID) | ORAL | Status: DC
  Filled 2019-08-02 (×3): qty 2

## 2019-08-02 MED ORDER — HALOPERIDOL 1 MG PO TABS
2.0000 mg | ORAL_TABLET | Freq: Two times a day (BID) | ORAL | Status: DC
  Administered 2019-08-03 – 2019-08-05 (×5): 2 mg via ORAL
  Filled 2019-08-02 (×4): qty 2
  Filled 2019-08-02: qty 1
  Filled 2019-08-02 (×2): qty 2

## 2019-08-02 MED ORDER — OXYCODONE HCL 5 MG PO TABS
5.0000 mg | ORAL_TABLET | Freq: Four times a day (QID) | ORAL | Status: DC | PRN
  Administered 2019-08-02 – 2019-08-03 (×3): 5 mg via ORAL
  Filled 2019-08-02 (×4): qty 1

## 2019-08-02 MED ORDER — HALOPERIDOL LACTATE 5 MG/ML IJ SOLN
2.0000 mg | Freq: Two times a day (BID) | INTRAMUSCULAR | Status: DC | PRN
  Administered 2019-08-03: 2 mg via INTRAMUSCULAR
  Filled 2019-08-02: qty 1

## 2019-08-02 MED ORDER — HALOPERIDOL 1 MG PO TABS
2.0000 mg | ORAL_TABLET | Freq: Two times a day (BID) | ORAL | Status: DC | PRN
  Administered 2019-08-05: 2 mg via ORAL
  Filled 2019-08-02: qty 2
  Filled 2019-08-02: qty 1
  Filled 2019-08-02: qty 2

## 2019-08-02 NOTE — TOC Progression Note (Addendum)
Transition of Care [facility]) - Progression Note    Patient Details  Name: Veronica Arias MRN: [id number] Date of Birth: 1991-02-04  Transition of Care [facility]) CM/SW [place], [place] Phone Number: 08/02/2019, 3:24 PM  Clinical Narrative:    IVC renewed and served by [employer]. Pt remains on waitlist at [facility] as of 9/11. Message also left with Angelica Gladney at [facility]. Re-referral also made to [facility] and [facility].   Expected Discharge Plan: IP Rehab Facility Barriers to Discharge: Continued Medical Work up(Referral sent to [facility])  Expected Discharge Plan and Services Expected Discharge Plan: [place]   Discharge Planning Services: CM Consult Post Acute Care Choice: NA Living arrangements for the past 2 months: Apartment   Social Determinants of Health (SDOH) Interventions    Readmission Risk Interventions No flowsheet data found.

## 2019-08-02 NOTE — Progress Notes (Signed)
Pt became agitated and was screaming.  She was trying to get up stating everything hurts.  She was assisted to the BR and had BM. Dr Cyndia Skeeters made aware.  Received order for oxycodone. Mother made aware.  Idolina Primer, RN

## 2019-08-02 NOTE — Progress Notes (Signed)
SLP Cancellation Note  Patient Details Name: Veronica Arias MRN: [id number] DOB: 02/24/1991   Cancelled treatment:       Reason Eval/Treat Not Completed: Fatigue/lethargy limiting ability to participate. Pt had received medications and was not alert for repeat BSE, which per MD was ordered to assess for potential to upgrade. SLP had just s/o on previous date - plan had been to assess for possible upgrade but our team was unable to see her due to decreased alertness. When last seen, she exhibited no overt signs of dysphagia or aspiration. Mechanical soft diet was started to see how she would handle POs, as she had not been eating much at all at that point. Her mom and her sitter share that when alert, she is clearing her plate and eating all solids without overt difficulty.   Recommend advancing diet to regular solids and thin liquids. She appears to be capable of this when alert. Education was provided to mother, sitter, and RN about upgrade and the need for more careful supervision with initial meal tray. Will f/u to see if additional evaluation is warranted.    Venita Sheffield Doyel Mulkern 08/02/2019, 12:54 PM  Pollyann Glen, M.A. [place] Acute Environmental education officer [phone] Office [phone]

## 2019-08-02 NOTE — Consult Note (Addendum)
Telepsych Consultation   Reason for Consult:  "Psychosis" Referring Physician:  Dr. Candelaria Stagers Location of Patient: [facility] Location of Provider: [facility]  Patient Identification: Veronica Arias MRN:  [id number] Principal Diagnosis: Psychosis [facility]) Diagnosis:  Principal Problem:   Altered mental status Active Problems:   Rhabdomyolysis   Sinus tachycardia   Suicidal ideation   Psychosis (HCC)   Total Time spent with patient: 15 minutes  Subjective:   Veronica Arias is a 28 y.o. female patient admitted with rhabdomyolysis.  HPI:   Per chart review, patient initially presented to the hospital on 8/8 for anxiety related to psychosocial stressors including school resuming. She is a Psychologist, forensic. She was psychiatrically cleared and provided with outpatient mental health resources. Patient presented again to the hospital on 8/10 for bizarre behavior, SI and paranoia. She was reportedly throwing objects from her balcony and endorsed SI with a plan to jump. She has been agitated and required multiple emergency behavioral medications. She was placed in restraints while in the ED. She developed rhabdomyolysis with persistent tachycardia and was admitted to the hospitalist service while awaiting psychiatric placement. She has received IVFs for AKI and rhabdomyolysis. Depakote and Zyprexa were discontinued on 8/14 due to concern for abnormal LFTs and rhabdomyolysis. On 8/18, she spiked a fever to 100.8 and tachycardia up to 140s. She was started on Unasyn given concern for fever. Patient was receiving IV Ativan 1 mg TID for concern for seizures (8/20-8/24). It was discontinued at her mother's request. Her workup has been unremarkable for infection or seizures. Autoimmune workup is pending. 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 IV Ativan did not appear to improve her symptoms.   Patient was last seen by the psychiatry  consult service on 8/27. She was nonverbal on interview. Her mother was at bedside. Discussed with her mother the concern for catatonia. A brief trial of IV Ativan did not appear to improve her symptoms. She declined considering ECT at this time and planned to transfer her daughter to the [facility] for medical and mental health care. She reported that she was willing to consider referral for ECT treatment if the current plan was unsuccessful. Her request to transfer her daughter was declined by [facility]. Psychiatry is reconsulted for psychosis. Patient was given Ativan 1 mg overnight for agitation. She was trying to pull her IV line out and requested Haldol 2 mg q 6 hours PRN was started on 9/5. She has received 2 doses over the past 24 hours. Patient's is now eating PO for several days.   Veronica Arias is initially standing at the door and staring. She is able to be redirected into her room by her primary team. She reports that she has been anxious her "entire life." She denies taking medications for anxiety in the past. She reports that she is not sleeping well in the hospital because she likes it "pitch dark" when she sleeps. She denies SI or HI. She reports AH and when asked to elaborate she reports, "Nothing I can hear myself talking."    Past Psychiatric History: Denies   Risk to Self: None. Denies SI.  Risk to Others: Patient has been intermittently agitated.  Prior Inpatient Therapy: Prior Inpatient Therapy: No Prior Outpatient Therapy: Prior Outpatient Therapy: No Does patient have an ACCT team?: No Does patient have Intensive In-House Services?  : No Does patient have [facility] services? :  No Does patient have P4CC services?: No  Past Medical History:  Past Medical History:  Diagnosis Date  . Asthma     Past Surgical History:  Procedure Laterality Date  . NO PAST SURGERIES     Family History: History reviewed. No pertinent family  history. Family Psychiatric  History: Mother denies.  Social History:  Social History   Substance and Sexual Activity  Alcohol Use No     Social History   Substance and Sexual Activity  Drug Use No    Social History   Socioeconomic History  . Marital status: Single    Spouse name: Not on file  . Number of children: Not on file  . Years of education: Not on file  . Highest education level: Not on file  Occupational History  . Not on file  Social Needs  . Financial resource strain: Not on file  . Food insecurity    Worry: Not on file    Inability: Not on file  . Transportation needs    Medical: Not on file    Non-medical: Not on file  Tobacco Use  . Smoking status: Never Smoker  . Smokeless tobacco: Never Used  Substance and Sexual Activity  . Alcohol use: No  . Drug use: No  . Sexual activity: Not on file  Lifestyle  . Physical activity    Days per week: Not on file    Minutes per session: Not on file  . Stress: Not on file  Relationships  . Social Musicianconnections    Talks on phone: Not on file    Gets together: Not on file    Attends religious service: Not on file    Active member of club or organization: Not on file    Attends meetings of clubs or organizations: Not on file    Relationship status: Not on file  Other Topics Concern  . Not on file  Social History Narrative  . Not on file   Additional Social History: She is a Runner, broadcasting/film/videoteacher at [employer]. She does not use alcohol or illicit substances.     Allergies:  No Known Allergies  Labs:  Results for orders placed or performed during the hospital encounter of 07/01/19 (from the past 48 hour(s))  Glucose, capillary     Status: None   Collection Time: 07/31/19 11:48 AM  Result Value Ref Range   Glucose-Capillary 96 70 - 99 mg/dL  Glucose, capillary     Status: None   Collection Time: 07/31/19  4:30 PM  Result Value Ref Range   Glucose-Capillary 92 70 - 99 mg/dL   Comment 1 Notify RN   Glucose,  capillary     Status: Abnormal   Collection Time: 07/31/19  9:34 PM  Result Value Ref Range   Glucose-Capillary 109 (H) 70 - 99 mg/dL  Glucose, capillary     Status: Abnormal   Collection Time: 08/01/19 12:34 AM  Result Value Ref Range   Glucose-Capillary 112 (H) 70 - 99 mg/dL  Glucose, capillary     Status: Abnormal   Collection Time: 08/01/19  3:58 AM  Result Value Ref Range   Glucose-Capillary 102 (H) 70 - 99 mg/dL  Comprehensive metabolic panel     Status: Abnormal   Collection Time: 08/01/19  5:00 AM  Result Value Ref Range   Sodium 139 135 - 145 mmol/L   Potassium 3.2 (L) 3.5 - 5.1 mmol/L   Chloride 104 98 - 111 mmol/L   CO2 25 22 -  32 mmol/L   Glucose, Bld 129 (H) 70 - 99 mg/dL   BUN <5 (L) 6 - 20 mg/dL   Creatinine, Ser 0.90 0.44 - 1.00 mg/dL   Calcium 8.7 (L) 8.9 - 10.3 mg/dL   Total Protein 6.4 (L) 6.5 - 8.1 g/dL   Albumin 3.0 (L) 3.5 - 5.0 g/dL   AST 35 15 - 41 U/L   ALT 62 (H) 0 - 44 U/L   Alkaline Phosphatase 54 38 - 126 U/L   Total Bilirubin 0.5 0.3 - 1.2 mg/dL   GFR calc non Af Amer >60 >60 mL/min   GFR calc Af Amer >60 >60 mL/min   Anion gap 10 5 - 15    Comment: Performed at [facility], [address], [place], [place] [address]  CK     Status: Abnormal   Collection Time: 08/01/19  5:00 AM  Result Value Ref Range   Total CK 372 (H) 38 - 234 U/L    Comment: Performed at [facility], [place] [address], [place], [place] [id number]  Magnesium     Status: None   Collection Time: 08/01/19  5:00 AM  Result Value Ref Range   Magnesium 2.0 1.7 - 2.4 mg/dL    Comment: Performed at [facility], [place] [address], [place], [place] [address]  Phosphorus     Status: None   Collection Time: 08/01/19  5:00 AM  Result Value Ref Range   Phosphorus 4.6 2.5 - 4.6 mg/dL    Comment: Performed at [facility] [address] [place], [place] [address]  Glucose, capillary     Status: Abnormal   Collection Time: 08/01/19  7:22 AM  Result Value Ref Range    Glucose-Capillary 117 (H) 70 - 99 mg/dL   Comment 1 Notify RN    Comment 2 Document in Chart   Glucose, capillary     Status: None   Collection Time: 08/01/19  1:18 PM  Result Value Ref Range   Glucose-Capillary 76 70 - 99 mg/dL   Comment 1 Notify RN    Comment 2 Document in Chart   Glucose, capillary     Status: Abnormal   Collection Time: 08/01/19  4:01 PM  Result Value Ref Range   Glucose-Capillary 104 (H) 70 - 99 mg/dL  Vitamin B12     Status: None   Collection Time: 08/01/19  6:13 PM  Result Value Ref Range   Vitamin B-12 410 180 - 914 pg/mL    Comment: (NOTE) This assay is not validated for testing neonatal or myeloproliferative syndrome specimens for Vitamin B12 levels. Performed at [facility], [place] [address], [place], [place] [address]   Folate     Status: None   Collection Time: 08/01/19  6:13 PM  Result Value Ref Range   Folate 16.9 >5.9 ng/mL    Comment: Performed at [facility] [address] [place], [place] [address]  Iron and TIBC     Status: None   Collection Time: 08/01/19  6:13 PM  Result Value Ref Range   Iron 44 28 - 170 ug/dL   TIBC 364 250 - 450 ug/dL   Saturation Ratios 12 10.4 - 31.8 %   UIBC 320 ug/dL    Comment: Performed at [facility], [place] [address] [place], [place] [address]  Ferritin     Status: None   Collection Time: 08/01/19  6:13 PM  Result Value Ref  Range   Ferritin 27 11 - 307 ng/mL    Comment: Performed at [facility], [address], [place], [place] [address]  Reticulocytes     Status: None   Collection Time: 08/01/19  6:13 PM  Result Value Ref Range   Retic Ct Pct 2.1 0.4 - 3.1 %   RBC. 4.11 3.87 - 5.11 MIL/uL   Retic Count, Absolute 87.1 19.0 - 186.0 K/uL   Immature Retic Fract 11.2 2.3 - 15.9 %    Comment: Performed at [facility], [address] [place], [place] [address]  Glucose, capillary     Status: None   Collection Time: 08/01/19  7:54 PM  Result Value Ref Range    Glucose-Capillary 85 70 - 99 mg/dL  Glucose, capillary     Status: None   Collection Time: 08/01/19 11:36 PM  Result Value Ref Range   Glucose-Capillary 97 70 - 99 mg/dL  Glucose, capillary     Status: Abnormal   Collection Time: 08/02/19  5:33 AM  Result Value Ref Range   Glucose-Capillary 103 (H) 70 - 99 mg/dL   Comment 1 Notify RN    Comment 2 Document in Chart   Magnesium     Status: None   Collection Time: 08/02/19  6:50 AM  Result Value Ref Range   Magnesium 1.8 1.7 - 2.4 mg/dL    Comment: Performed at [facility], [address] [place], [place] [address]  Phosphorus     Status: None   Collection Time: 08/02/19  6:50 AM  Result Value Ref Range   Phosphorus 4.3 2.5 - 4.6 mg/dL    Comment: Performed at [facility], [address] [place], [place] [address]  Basic metabolic panel     Status: Abnormal   Collection Time: 08/02/19  6:50 AM  Result Value Ref Range   Sodium 138 135 - 145 mmol/L   Potassium 3.8 3.5 - 5.1 mmol/L   Chloride 105 98 - 111 mmol/L   CO2 24 22 - 32 mmol/L   Glucose, Bld 103 (H) 70 - 99 mg/dL   BUN <5 (L) 6 - 20 mg/dL   Creatinine, Ser 5.11 0.44 - 1.00 mg/dL   Calcium 9.4 8.9 - 02.1 mg/dL   GFR calc non Af Amer >60 >60 mL/min   GFR calc Af Amer >60 >60 mL/min   Anion gap 9 5 - 15    Comment: Performed at [facility], [address], [place], [place] [address]  CBC     Status: Abnormal   Collection Time: 08/02/19  6:50 AM  Result Value Ref Range   WBC 5.3 4.0 - 10.5 K/uL   RBC 3.87 3.87 - 5.11 MIL/uL   Hemoglobin 10.2 (L) 12.0 - 15.0 g/dL   HCT 67.0 (L) 14.1 - 03.0 %   MCV 84.8 80.0 - 100.0 fL   MCH 26.4 26.0 - 34.0 pg   MCHC 31.1 30.0 - 36.0 g/dL   RDW 13.1 43.8 - 88.7 %   Platelets 338 150 - 400 K/uL   nRBC 0.0 0.0 - 0.2 %    Comment: Performed at [facility], [address] [place], [place] [address]  Glucose, capillary     Status: None   Collection Time: 08/02/19  8:12 AM  Result Value Ref Range    Glucose-Capillary 95 70 - 99 mg/dL    Medications:  Current Facility-Administered Medications  Medication Dose Route Frequency Provider  Last Rate Last Dose  . acetaminophen (TYLENOL) suppository 650 mg  650 mg Rectal Q6H PRN Leda Gauze, NP   650 mg at 08/02/19 0853  . alum & mag hydroxide-simeth (MAALOX/MYLANTA) 200-200-20 MG/5ML suspension 30 mL  30 mL Oral Q4H PRN John Giovanni, MD      . dextrose 5 % and 0.45 % NaCl with KCl 20 mEq/L infusion   Intravenous Continuous Candelaria Stagers T, MD 100 mL/hr at 08/02/19 0613    . enoxaparin (LOVENOX) injection 40 mg  40 mg Subcutaneous Q24H Swayze, Ava, DO   40 mg at 08/01/19 1438  . ferrous sulfate tablet 325 mg  325 mg Oral Q breakfast Berton Mount I, MD   325 mg at 08/02/19 0853  . folic acid (FOLVITE) tablet 1 mg  1 mg Oral Daily John Giovanni, MD   1 mg at 08/02/19 0853  . haloperidol lactate (HALDOL) injection 2 mg  2 mg Intravenous Q6H PRN Swayze, Ava, DO   2 mg at 08/01/19 2211  . hydrALAZINE (APRESOLINE) injection 10 mg  10 mg Intravenous Q6H PRN Marguerita Merles Franklin, DO   10 mg at 07/18/19 2118  . lip balm (CARMEX) ointment   Topical PRN Swayze, Ava, DO      . LORazepam (ATIVAN) tablet 1 mg  1 mg Oral BID BM & HS PRN Gonfa, Taye T, MD      . magnesium hydroxide (MILK OF MAGNESIA) suspension 30 mL  30 mL Oral Daily PRN John Giovanni, MD      . metoprolol tartrate (LOPRESSOR) tablet 12.5 mg  12.5 mg Oral BID Candelaria Stagers T, MD   12.5 mg at 08/02/19 0852  . nystatin (MYCOSTATIN) 100000 UNIT/ML suspension 500,000 Units  5 mL Oral QID Swayze, Ava, DO   500,000 Units at 08/02/19 0953  . sodium chloride flush (NS) 0.9 % injection 10-40 mL  10-40 mL Intracatheter Q12H Swayze, Ava, DO   10 mL at 08/02/19 0900  . sodium chloride flush (NS) 0.9 % injection 10-40 mL  10-40 mL Intracatheter PRN Swayze, Ava, DO   10 mL at 07/23/19 0938  . thiamine (VITAMIN B-1) tablet 100 mg  100 mg Oral Daily John Giovanni, MD   100 mg at  08/02/19 0865   Or  . thiamine (B-1) injection 100 mg  100 mg Intravenous Daily John Giovanni, MD   100 mg at 08/01/19 1430    Musculoskeletal: Strength & Muscle Tone: No atrophy noted. Gait & Station: normal Patient leans: N/A  Psychiatric Specialty Exam: Physical Exam  Nursing note and vitals reviewed. Constitutional: She is oriented to person, place, and time. She appears well-developed and well-nourished.  HENT:  Head: Normocephalic and atraumatic.  Neck: Normal range of motion.  Respiratory: Effort normal.  Musculoskeletal: Normal range of motion.  Neurological: She is alert and oriented to person, place, and time.  Psychiatric: Her speech is normal. Thought content normal. Her mood appears anxious. She is agitated. Cognition and memory are impaired. She expresses inappropriate judgment.    Review of Systems  Cardiovascular: Negative for chest pain.  Gastrointestinal: Positive for abdominal pain.  Psychiatric/Behavioral: Negative for hallucinations and suicidal ideas. The patient is nervous/anxious and has insomnia.   All other systems reviewed and are negative.   Blood pressure 140/71, pulse 84, temperature 98.3 F (36.8 C), temperature source Oral, resp. rate 15, height 5\' 3"  (1.6 m), weight 82.1 kg, SpO2 100 %.Body mass index is 32.06 kg/m.  General Appearance: Fairly Groomed, young, African American  female, wearing a hospital gown with unkempt locs and corrective lenses who is standing in her room. NAD.   Eye Contact:  Good  Speech:  Clear and Coherent and Normal Rate  Volume:  Normal  Mood:  Anxious  Affect:  Labile and briefly tearful.   Thought Process:  Linear and Descriptions of Associations: Intact  Orientation:  Full (Time, Place, and Person)  Thought Content:  Logical  Suicidal Thoughts:  No  Homicidal Thoughts:  No  Memory:  Immediate;   Good Recent;   Good Remote;   Good  Judgement:  Poor  Insight:  Shallow  Psychomotor Activity:  Restlessness   Concentration:  Concentration: Fair and Attention Span: Fair  Recall:  FiservFair  Fund of Knowledge:  Fair  Language:  Fair  Akathisia:  No  Handed:  Right  AIMS (if indicated):   N/A  Assets:  ArchitectCommunication Skills Financial Resources/Insurance Housing Resilience Social Support  ADL's:  Intact  Cognition:  Impaired due to psychiatric condition.    Sleep:   Poor   Assessment:  Sudie BaileyKyla Hoeg is a 28 y.o. female who was admitted to [facility] on [facility]10 for bizarre behavior, SI and paranoia. She was initially agitated and required multiple emergency behavioral medications and restraints. She developed rhabdomyolysis with persistent tachycardia and was admitted to the hospitalist service while awaiting psychiatric placement. Patient appears improved today. She is labile in affect but is verbal compared to nonverbal the last time she was seen by psychiatry. She denies SI or HI. She does not appear to be responding to internal stimuli. Recommend restarting Depakote for mood stabilization and schedule Haldol for psychosis/agitation. She continues to warrant inpatient psychiatric hospitalization for further stabilization and treatment.    Treatment Plan Summary: -Patient warrants inpatient psychiatric hospitalization given high risk of harm to self. -Continue bedside sitter.  -Schedule Haldol 2 mg BID with 2 mg BID PRN as needed for agitation/psychosis.  -Start Depakote 250 mg BID for mood stabilization. Can increase to 500 mg BID if only partially effective.  -EKG reviewed and QTc 431 on 8/15. Please closely monitor when starting or increasing QTc prolonging agents.   -Patient is under IVC and therefore cannot leave the hospital.  -Psychiatry will continue to follow as needed.     Disposition: Recommend psychiatric Inpatient admission when medically cleared.  This service was provided via telemedicine using a 2-way, interactive audio and video technology.  Names of all persons participating in this  telemedicine service and their role in this encounter. Name: Juanetta BeetsJacqueline Talya Quain, DO Role: Psychiatrist   Name: Sudie BaileyKyla Garske  Role: Patient    Cherly BeachJacqueline J Cartina Brousseau, DO 08/02/2019 9:54 AM

## 2019-08-02 NOTE — Progress Notes (Signed)
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  She was discharged from [place] ED after improvement with diazepam.  Decompensated 2 days later and brought to ED again on 8/10.  She required multiple dose of antipsychotics in ED. Developed rhabdo, AKI and transaminitis thought to be due to antipsychotics. CK elevated to 3000> 4500. AST 76.  ALT 145.  CBC normal.  COVID-19, pregnancy test, ethanol, Tylenol and salicylate level negative.  UDS positive for benzo.  Patient was admitted.  Zyprexa discontinued due to rhabdo.     On 8/18-rapid response due to fever and tachycardia.  Mental status change and ongoing refusal to take p.o. 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  Neurology consulted and had extensive work-up including CT head, MRI head, EEG and CSF without explanation.   9/3-palliative care consulted and met with the patient and patient's mother and had an extensive discussion about treatment options including eternal feeding, ECT, transfer to other facility ([facility], [facility], [facility])  Obtained for transfer by previous provider was not successful.  Finally family agreed to placing cortrack but patient started taking p.o.  Subjective: Patient was agitated this morning requiring Haldol and Ativan.  Otherwise, no major events overnight of this morning.  She complains back pain but not able to localize.  She is somewhat drowsy.  Minimally follows command.  Does not appear to be in distress.  Good p.o. intake per RN report.  Objective: Vitals:   08/01/19 2338 08/02/19 0631 08/02/19 0837 08/02/19 1131  BP: 132/83 (!) 141/94 140/71 120/88  Pulse: 74 95 84 83  Resp: '18 16 15 16  '$ Temp: 98.4 F (36.9 C) 98.2 F (36.8  C) 98.3 F (36.8 C) (!) 97.5 F (36.4 C)  TempSrc: Oral Oral Oral Axillary  SpO2: 100% 100% 100% 100%  Weight:      Height:        Intake/Output Summary (Last 24 hours) at 08/02/2019 1341 Last data filed at 08/02/2019 0940 Gross per 24 hour  Intake 1262.19 ml  Output 1160 ml  Net 102.19 ml   Filed Weights   07/24/19 0418 07/26/19 0400 07/31/19 0350  Weight: 86.2 kg 85.7 kg 82.1 kg    Examination:  GENERAL: No acute distress.  Sleepy but opens eyes and responds to voice. HEENT: MMM.  Vision and hearing grossly intact.  NECK: Supple.  No apparent JVD.  RESP:  No IWOB. Good air movement bilaterally. CVS:  RRR. Heart sounds normal.  ABD/GI/GU: Bowel sounds present. Soft. Non tender.  MSK/EXT:  Moves extremities. No apparent deformity or edema.  SKIN: no apparent skin lesion or wound NEURO: Sleepy but opens eyes in response to voice.  No gross focal neuro deficit. PSYCH: Calm and sleepy.  Assessment & Plan: Acute psychosis with positive and negative symptoms and suicidal ideation Acute metabolic encephalopathy/catatonia? -Encourage p.o. intake-improving. -On PRN haloperidol and Ativan-no obvious extraparametal symptoms. -Continue safety sitter and assistance with feeding -Consulted psych over 9/10 and 9/11-awaiting on response. -PT/OT/SLP-appreciate recommendations.  Rhabdomyolysis: Improved.  CK down to 372. -Reduced IV fluid to 75 cc an hour.  Elevated liver enzymes: Resolving.  Likely due to rhabdomyolysis. -Monitor as appropriate  Hyponatremia/hypochloremia: Resolved.  Hypokalemia: Likely due to IV fluid.  Resolved.  Sinus tachycardia: Resolved. -Changed IV metoprolol to p.o.  AKI: Resolved. Likely a combination of prerenal and ATN from poor p.o. intake and rhabdo respectively.  Normocytic anemia: Hgb more or less stable.  Anemia panel normal.  Obesity: BMI 32. -We will encourage weight loss once mental status improves..  Back pain: No focal tenderness on  exam. -PRN Tylenol and oxycodone -We will consider imaging if continues to be a problem throughout the day.  DVT prophylaxis: Subcu Lovenox Code Status: Full code Family Communication: Updated patient's mother over the phone. Consultants: Psychiatry, neurology (signed off), palliative Disposition: Patient is medically ready for discharge.  Final disposition likely inpatient psych but waiting on psych for input.  Procedures:  None  Microbiology summarized: [id number] negative. Blood cultures negative. CSF cultures negative. Urine culture negative.  Antimicrobials: Anti-infectives (From admission, onward)   Start     Dose/Rate Route Frequency Ordered Stop   07/09/19 1900  Ampicillin-Sulbactam (UNASYN) 3 g in sodium chloride 0.9 % 100 mL IVPB     3 g 200 mL/hr over 30 Minutes Intravenous Every 8 hours 07/09/19 1845 07/16/19 1904      Sch Meds:  Scheduled Meds: . enoxaparin (LOVENOX) injection  40 mg Subcutaneous Q24H  . ferrous sulfate  325 mg Oral Q breakfast  . folic acid  1 mg Oral Daily  . metoprolol tartrate  12.5 mg Oral BID  . nystatin  5 mL Oral QID  . sodium chloride flush  10-40 mL Intracatheter Q12H  . thiamine  100 mg Oral Daily   Or  . thiamine  100 mg Intravenous Daily   Continuous Infusions: . dextrose 5 % and 0.45 % NaCl with KCl 20 mEq/L 100 mL/hr at 08/02/19 0613   PRN Meds:.acetaminophen, alum & mag hydroxide-simeth, haloperidol lactate, hydrALAZINE, lip balm, LORazepam, magnesium hydroxide, oxyCODONE, sodium chloride flush   I have personally reviewed the following labs and images: CBC: Recent Labs  Lab 07/31/19 0845 08/02/19 0650  WBC 5.2 5.3  NEUTROABS 2.8  --   HGB 9.6* 10.2*  HCT 30.1* 32.8*  MCV 83.4 84.8  PLT 347 338   BMP &GFR Recent Labs  Lab 07/27/19 0502 07/28/19 1140 07/31/19 0845 08/01/19 0500 08/02/19 0650  NA 138 139 138 139 138  K 3.2* 3.9 3.3* 3.2* 3.8  CL 105 104 104 104 105  CO2 '25 27 26 25 24  '$ GLUCOSE 122* 100*  105* 129* 103*  BUN <5* <5* <5* <5* <5*  CREATININE 0.82 0.71 0.91 0.90 0.91  CALCIUM 9.0 9.5 8.8* 8.7* 9.4  MG  --   --  1.5* 2.0 1.8  PHOS  --   --  4.0 4.6 4.3   Estimated Creatinine Clearance: 95.1 mL/min (by C-G formula based on SCr of 0.91 mg/dL). Liver & Pancreas: Recent Labs  Lab 07/31/19 0845 08/01/19 0500  AST 29 35  ALT 58* 62*  ALKPHOS 53 54  BILITOT 0.2* 0.5  PROT 6.4* 6.4*  ALBUMIN 3.0* 3.0*   No results for input(s): LIPASE, AMYLASE in the last 168 hours. No results for input(s): AMMONIA in the last 168 hours. Diabetic: No results for input(s): HGBA1C in the last 72 hours. Recent Labs  Lab 08/01/19 1601 08/01/19 1954 08/01/19 2336 08/02/19 0533 08/02/19 0812  GLUCAP 104* 85 97 103* 95   Cardiac Enzymes: Recent Labs  Lab 08/01/19 0500  CKTOTAL 372*   No results for input(s): PROBNP in the last 8760 hours. Coagulation Profile: No results for input(s): INR, PROTIME in the last  168 hours. Thyroid Function Tests: No results for input(s): TSH, T4TOTAL, FREET4, T3FREE, THYROIDAB in the last 72 hours. Lipid Profile: No results for input(s): CHOL, HDL, LDLCALC, TRIG, CHOLHDL, LDLDIRECT in the last 72 hours. Anemia Panel: Recent Labs    08/01/19 1813  VITAMINB12 410  FOLATE 16.9  FERRITIN 27  TIBC 364  IRON 44  RETICCTPCT 2.1   Urine analysis:    Component Value Date/Time   COLORURINE YELLOW 07/09/2019 1115   APPEARANCEUR CLEAR 07/09/2019 1115   LABSPEC 1.006 07/09/2019 1115   PHURINE 5.0 07/09/2019 1115   GLUCOSEU NEGATIVE 07/09/2019 1115   HGBUR MODERATE (A) 07/09/2019 1115   BILIRUBINUR NEGATIVE 07/09/2019 1115   KETONESUR NEGATIVE 07/09/2019 1115   PROTEINUR NEGATIVE 07/09/2019 1115   NITRITE NEGATIVE 07/09/2019 1115   LEUKOCYTESUR NEGATIVE 07/09/2019 1115   Sepsis Labs: Invalid input(s): PROCALCITONIN, [place]  Microbiology: Recent Results (from the past 240 hour(s))  Novel Coronavirus, NAA (hospital order; send-out to ref  lab)     Status: None   Collection Time: 07/29/19 10:52 AM   Specimen: Nasopharyngeal Swab; Respiratory  Result Value Ref Range Status   SARS-CoV-2, NAA NOT DETECTED NOT DETECTED Final    Comment: (NOTE) This nucleic acid amplification test was developed and its performance characteristics determined by [employer]. Nucleic acid amplification tests include PCR and TMA. This test has not been FDA cleared or approved. This test has been authorized by FDA under an Emergency Use Authorization (EUA). This test is only authorized for the duration of time the declaration that circumstances exist justifying the authorization of the emergency use of in vitro diagnostic tests for detection of SARS-CoV-2 virus and/or diagnosis of COVID-19 infection under section 564(b)(1) of the Act, 21 U.S.C. 628MNO-1(R) (1), unless the authorization is terminated or revoked sooner. When diagnostic testing is negative, the possibility of a false negative result should be considered in the context of a patient's recent exposures and the presence of clinical signs and symptoms consistent with COVID-19. An individual without symptoms of COVID- 19 and who is not shedding SARS-CoV-2 vi rus would expect to have a negative (not detected) result in this assay. Performed At: [facility] [address] [place], [place] [id number] Rush Farmer MD YB:[id number]    [place]  Final    Comment: Performed at [facility], [place] [address], [place], [place] [address]    Radiology Studies: No results found.  Taye T. Parrish  If 7PM-7AM, please contact night-coverage [email or web address] Password [facility] 08/02/2019, 1:41 PM

## 2019-08-02 NOTE — Progress Notes (Signed)
Pt's mom would not like for pt to start taking the depakote until she speaks with MD about the medication. Per pt's mom she's had a lot of issues in the past with different medications & she doesn't feel comfortable with her taking it until she speaks with the MD. RN advised mom she will hold the medication & pass on information to the day shift nurse. Pt's mom also informed that RN will place a paper at the bedside for her to look at tomorrow as it pertains to the medication. Hoover Brunette, RN

## 2019-08-02 NOTE — Progress Notes (Signed)
OT Cancellation Note  Patient Details Name: Veronica Arias MRN: [id number] DOB: 02/17/1991   Cancelled Treatment:    Reason Eval/Treat Not Completed: Other (comment).  Attempted skilled OT session.  Sitter and family member who was present both  stated they had just put her back to bed and she was too tired for any participation at this time.  I reviewed we would attempt to get back later today but may not be able to.  They both verbalized understanding but wanted pt. To rest for now.  Will attempt back as able.    Janice Coffin, COTA/L 08/02/2019, 12:23 PM

## 2019-08-02 NOTE — TOC Progression Note (Signed)
Transition of Care [facility]) - Progression Note    Patient Details  Name: Taiwana Willison MRN: [id number] Date of Birth: 09/09/1991  [facility] CM/SW Contact  Graves-Bigelow, Ocie Cornfield, RN Phone Number: 08/02/2019, 4:35 PM  Clinical Narrative:   Upon Request from [name] CM called the Psych unit at the [facility] at [phone]. CM was asked to call [phone] the Care Coordinator at the [facility], however received no answer and unable to leave voicemail. CM would have to go through the care coordinator to see if patient meets criteria for psych unit at [place] did call the Charge RN on the psych unit at [phone]- they are full and she directed me to call back on Monday. CM did make patient and mom aware. CM will call [name] as well. Will continue to monitor and CSW to call [facility].   Expected Discharge Plan: IP Rehab Facility Barriers to Discharge: Continued Medical Work up(Referral sent to [facility])  Expected Discharge Plan and Services Expected Discharge Plan: [place]   Discharge Planning Services: CM Consult Post Acute Care Choice: NA Living arrangements for the past 2 months: Apartment                                       Social Determinants of Health (SDOH) Interventions    Readmission Risk Interventions No flowsheet data found.

## 2019-08-03 LAB — GLUCOSE, CAPILLARY
Glucose-Capillary: 109 mg/dL — ABNORMAL HIGH (ref 70–99)
Glucose-Capillary: 106 mg/dL — ABNORMAL HIGH (ref 70–99)
Glucose-Capillary: 107 mg/dL — ABNORMAL HIGH (ref 70–99)
Glucose-Capillary: 119 mg/dL — ABNORMAL HIGH (ref 70–99)

## 2019-08-03 LAB — COMPREHENSIVE METABOLIC PANEL
ALT: 74 U/L — ABNORMAL HIGH (ref 0–44)
AST: 40 U/L (ref 15–41)
Albumin: 3.5 g/dL (ref 3.5–5.0)
Alkaline Phosphatase: 108 U/L (ref 38–126)
Anion gap: 9 (ref 5–15)
BUN: <5 mg/dL — ABNORMAL LOW (ref 6–20)
CO2: 24 mmol/L (ref 22–32)
Calcium: 9.4 mg/dL (ref 8.9–10.3)
Chloride: 105 mmol/L (ref 98–111)
Creatinine, Ser: 0.93 mg/dL (ref 0.44–1.00)
GFR calc Af Amer: >60 mL/min (ref >60)
GFR calc non Af Amer: >60 mL/min (ref >60)
Glucose, Bld: 115 mg/dL — ABNORMAL HIGH (ref 70–99)
Potassium: 3.7 mmol/L (ref 3.5–5.1)
Sodium: 138 mmol/L (ref 135–145)
Total Bilirubin: 0.4 mg/dL (ref 0.3–1.2)
Total Protein: 6.9 g/dL (ref 6.5–8.1)

## 2019-08-03 LAB — CK: Total CK: 299 U/L — ABNORMAL HIGH (ref 38–234)

## 2019-08-03 LAB — MAGNESIUM: Magnesium: 1.7 mg/dL (ref 1.7–2.4)

## 2019-08-03 MED ORDER — MAGNESIUM SULFATE 2 GM/50ML IV SOLN
2.0000 g | Freq: Once | INTRAVENOUS | Status: AC
  Administered 2019-08-03: 16:00:00 2 g via INTRAVENOUS
  Filled 2019-08-03: qty 50

## 2019-08-03 MED ORDER — POTASSIUM CHLORIDE CRYS ER 20 MEQ PO TBCR
40.0000 meq | EXTENDED_RELEASE_TABLET | Freq: Once | ORAL | Status: AC
  Administered 2019-08-03: 16:00:00 40 meq via ORAL
  Filled 2019-08-03: qty 2

## 2019-08-03 MED ORDER — ACETAMINOPHEN 325 MG PO TABS
650.0000 mg | ORAL_TABLET | Freq: Four times a day (QID) | ORAL | Status: DC | PRN
  Administered 2019-08-03: 650 mg via ORAL

## 2019-08-03 NOTE — Progress Notes (Signed)
RN called into room by [employer] cabin crew. Per sitter pt was acting out. Upon entering the room pt was sitting on the floor in [place] style near the window with her pillow & iv pump near here. Pt was also drooling. Per sitter pt did not fall she placed herself on the floor & even tried to sleep on the floor. Huddle done with staff. VS taken & charted. MD on call [place] notified. Will continue to monitor the pt.Hoover Brunette, RN

## 2019-08-03 NOTE — Progress Notes (Signed)
Pt was agitated and trying to get out of the room.  She expressed desire to go home.  Unable to redirect her with staff and security was called.  Prn haldol was given. Pt was calmed down and currently sleeping in bed.  Idolina Primer, RN

## 2019-08-03 NOTE — Progress Notes (Signed)
Speech pathology:  Spoke with RN, who reports pt is doing well with regular diet, thin liquids (upgraded from mechanical soft diet yesterday).  As long as she is alert, she eats without difficulty.  No further SLP needs, no formal evaluation needed; our service will sign off.  Adin Lariccia L. Tivis Ringer, [place] Office number [phone] Pager [phone]

## 2019-08-03 NOTE — Progress Notes (Signed)
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  She was discharged from [place] ED after improvement with diazepam.  Decompensated 2 days later and brought to ED again on 8/10.  She required multiple dose of antipsychotics in ED. Developed rhabdo, AKI and transaminitis thought to be due to antipsychotics. CK elevated to 3000> 4500. AST 76.  ALT 145.  CBC normal.  COVID-19, pregnancy test, ethanol, Tylenol and salicylate level negative.  UDS positive for benzo.  Patient was admitted.  Zyprexa discontinued due to rhabdo.     On 8/18-rapid response due to fever and tachycardia.  Mental status change and ongoing refusal to take p.o. 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  Neurology consulted and had extensive work-up including CT head, MRI head, EEG and CSF without explanation.   9/3-palliative care consulted and met with the patient and patient's mother and had an extensive discussion about treatment options including eternal feeding, ECT, transfer to other facility ([facility], [facility], [facility])  Obtained for transfer by previous provider was not successful.  Finally family agreed to placing cortrack but patient started taking p.o.  Subjective: Continues to have intermittent agitation requiring PRN Haldol. Reportedly ate her breakfast well this morning.  She was drowsy when I saw her this morning.  She follows some commands.  Does not appear to be in distress.  No gross focal neuro deficits.  Objective: Vitals:   08/03/19 0007 08/03/19 0353 08/03/19 0725 08/03/19 1056  BP: 123/76 124/80 130/81 130/81  Pulse: 73  62 (!) 106  Resp: 18 18    Temp: 98 F (36.7 C) 98.5 F (36.9 C) 98.4 F (36.9 C) 98.4 F (36.9 C)   TempSrc: Oral Oral Oral Oral  SpO2: 99% 99% 99% 99%  Weight:      Height:        Intake/Output Summary (Last 24 hours) at 08/03/2019 1443 Last data filed at 08/03/2019 1035 Gross per 24 hour  Intake 1172.01 ml  Output 600 ml  Net 572.01 ml   Filed Weights   07/24/19 0418 07/26/19 0400 07/31/19 0350  Weight: 86.2 kg 85.7 kg 82.1 kg    Examination:  GENERAL: No acute distress.  Sleepy but opens eyes and follows some command. HEENT: MMM.  Vision and hearing grossly intact.  NECK: Supple.  No apparent JVD.  RESP:  No IWOB. Good air movement bilaterally. CVS:  RRR. Heart sounds normal.  ABD/GI/GU: Bowel sounds present. Soft. Non tender.  MSK/EXT:  Moves extremities. No apparent deformity or edema.  SKIN: no apparent skin lesion or wound NEURO: Sleepy but rises to voice easily.  No gross focal neuro deficit. PSYCH: Calm and sleepy.  Assessment & Plan: Acute psychosis with positive and negative symptoms and suicidal ideation Acute metabolic encephalopathy/catatonia? -Good p.o. intake now. -Appreciate psych input  -Inpatient psych  -Depakote 250 mg twice daily-patient's mother refused this  -Haldol 2 mg BID + BID PRN agitation  -Continue safety sitter -Monitor QTC and electrolytes -PT/OT/SLP-appreciate recommendations.  Rhabdomyolysis: Improved.  CK 4k>>300 -Stop IV fluid-good p.o. intake  Elevated liver enzymes: Resolving.  Likely due to rhabdomyolysis. -Monitor as appropriate  Hyponatremia/hypochloremia: Resolved.  Hypokalemia: Likely due to IV fluid.  Resolved.  Sinus tachycardia: Resolved. -P.o. metoprolol  AKI: Resolved. Likely a combination of prerenal and ATN from poor p.o. intake and rhabdo respectively.  Normocytic anemia: Hgb more or less stable.  Anemia panel normal.  Obesity: BMI 32. -We will encourage weight loss once mental status improves..  Back pain: No focal tenderness on exam. -PRN Tylenol and oxycodone  DVT prophylaxis: Subcu Lovenox Code  Status: Full code Family Communication: Updated patient's mother over the phone. Consultants: Psychiatry, neurology (signed off), palliative Disposition: Patient is medically ready for transfer to inpatient psych  Procedures:  None  Microbiology summarized: [id number] negative. Blood cultures negative. CSF cultures negative. Urine culture negative.  Antimicrobials: Anti-infectives (From admission, onward)   Start     Dose/Rate Route Frequency Ordered Stop   07/09/19 1900  Ampicillin-Sulbactam (UNASYN) 3 g in sodium chloride 0.9 % 100 mL IVPB     3 g 200 mL/hr over 30 Minutes Intravenous Every 8 hours 07/09/19 1845 07/16/19 1904      Sch Meds:  Scheduled Meds: . divalproex  250 mg Oral Q12H  . enoxaparin (LOVENOX) injection  40 mg Subcutaneous Q24H  . ferrous sulfate  325 mg Oral Q breakfast  . folic acid  1 mg Oral Daily  . haloperidol  2 mg Oral BID   Or  . haloperidol lactate  2 mg Intramuscular BID  . metoprolol tartrate  12.5 mg Oral BID  . nystatin  5 mL Oral QID  . thiamine  100 mg Oral Daily   Or  . thiamine  100 mg Intravenous Daily   Continuous Infusions: . dextrose 5 % and 0.45 % NaCl with KCl 20 mEq/L 100 mL/hr at 08/03/19 1409   PRN Meds:.acetaminophen, alum & mag hydroxide-simeth, haloperidol **OR** haloperidol lactate, hydrALAZINE, lip balm, LORazepam, magnesium hydroxide, oxyCODONE   I have personally reviewed the following labs and images: CBC: Recent Labs  Lab 07/31/19 0845 08/02/19 0650  WBC 5.2 5.3  NEUTROABS 2.8  --   HGB 9.6* 10.2*  HCT 30.1* 32.8*  MCV 83.4 84.8  PLT 347 338   BMP &GFR Recent Labs  Lab 07/28/19 1140 07/31/19 0845 08/01/19 0500 08/02/19 0650 08/03/19 0418  NA 139 138 139 138 138  K 3.9 3.3* 3.2* 3.8 3.7  CL 104 104 104 105 105  CO2 '27 26 25 24 24  '$ GLUCOSE 100* 105* 129* 103* 115*  BUN <5* <5* <5* <5* <5*  CREATININE 0.71 0.91 0.90 0.91 0.93  CALCIUM 9.5 8.8* 8.7* 9.4 9.4  MG  --  1.5* 2.0 1.8 1.7  PHOS  --   4.0 4.6 4.3  --    Estimated Creatinine Clearance: 93.1 mL/min (by C-G formula based on SCr of 0.93 mg/dL). Liver & Pancreas: Recent Labs  Lab 07/31/19 0845 08/01/19 0500 08/03/19 0418  AST 29 35 40  ALT 58* 62* 74*  ALKPHOS 53 54 108  BILITOT 0.2* 0.5 0.4  PROT 6.4* 6.4* 6.9  ALBUMIN 3.0* 3.0* 3.5   No results for input(s): LIPASE, AMYLASE in the last 168 hours. No results for input(s): AMMONIA in the last 168 hours. Diabetic: No results for input(s): HGBA1C in the last 72 hours. Recent Labs  Lab 08/02/19 0812 08/02/19 1111 08/02/19 1601 08/02/19 2328 08/03/19 0403  GLUCAP 95 111* 80 102* 109*   Cardiac Enzymes: Recent Labs  Lab 08/01/19 0500 08/03/19 0418  CKTOTAL 372* 299*   No results for input(s): PROBNP in the last 8760 hours. Coagulation Profile: No results for input(s): INR, PROTIME in the last 168 hours. Thyroid Function Tests:  No results for input(s): TSH, T4TOTAL, FREET4, T3FREE, THYROIDAB in the last 72 hours. Lipid Profile: No results for input(s): CHOL, HDL, LDLCALC, TRIG, CHOLHDL, LDLDIRECT in the last 72 hours. Anemia Panel: Recent Labs    08/01/19 1813  VITAMINB12 410  FOLATE 16.9  FERRITIN 27  TIBC 364  IRON 44  RETICCTPCT 2.1   Urine analysis:    Component Value Date/Time   COLORURINE YELLOW 07/09/2019 1115   APPEARANCEUR CLEAR 07/09/2019 1115   LABSPEC 1.006 07/09/2019 1115   PHURINE 5.0 07/09/2019 1115   GLUCOSEU NEGATIVE 07/09/2019 1115   HGBUR MODERATE (A) 07/09/2019 1115   BILIRUBINUR NEGATIVE 07/09/2019 1115   KETONESUR NEGATIVE 07/09/2019 1115   PROTEINUR NEGATIVE 07/09/2019 1115   NITRITE NEGATIVE 07/09/2019 1115   LEUKOCYTESUR NEGATIVE 07/09/2019 1115   Sepsis Labs: Invalid input(s): PROCALCITONIN, [place]  Microbiology: Recent Results (from the past 240 hour(s))  Novel Coronavirus, NAA (hospital order; send-out to ref lab)     Status: None   Collection Time: 07/29/19 10:52 AM   Specimen: Nasopharyngeal Swab;  Respiratory  Result Value Ref Range Status   SARS-CoV-2, NAA NOT DETECTED NOT DETECTED Final    Comment: (NOTE) This nucleic acid amplification test was developed and its performance characteristics determined by [employer]. Nucleic acid amplification tests include PCR and TMA. This test has not been FDA cleared or approved. This test has been authorized by FDA under an Emergency Use Authorization (EUA). This test is only authorized for the duration of time the declaration that circumstances exist justifying the authorization of the emergency use of in vitro diagnostic tests for detection of SARS-CoV-2 virus and/or diagnosis of COVID-19 infection under section 564(b)(1) of the Act, 21 U.S.C. 719BOZ-2(R) (1), unless the authorization is terminated or revoked sooner. When diagnostic testing is negative, the possibility of a false negative result should be considered in the context of a patient's recent exposures and the presence of clinical signs and symptoms consistent with COVID-19. An individual without symptoms of COVID- 19 and who is not shedding SARS-CoV-2 vi rus would expect to have a negative (not detected) result in this assay. Performed At: [facility] [address] [place], [place] [id number] Rush Farmer MD HE:[id number]    [place]  Final    Comment: Performed at [facility], [place] [address] [place], [place] [address]    Radiology Studies: No results found.  Kaelene Elliston T. Stites  If 7PM-7AM, please contact night-coverage [email or web address] Password [facility] 08/03/2019, 2:43 PM

## 2019-08-04 LAB — BASIC METABOLIC PANEL
Anion gap: 8 (ref 5–15)
BUN: <5 mg/dL — ABNORMAL LOW (ref 6–20)
CO2: 25 mmol/L (ref 22–32)
Calcium: 9.6 mg/dL (ref 8.9–10.3)
Chloride: 109 mmol/L (ref 98–111)
Creatinine, Ser: 0.9 mg/dL (ref 0.44–1.00)
GFR calc Af Amer: >60 mL/min (ref >60)
GFR calc non Af Amer: >60 mL/min (ref >60)
Glucose, Bld: 91 mg/dL (ref 70–99)
Potassium: 4.2 mmol/L (ref 3.5–5.1)
Sodium: 142 mmol/L (ref 135–145)

## 2019-08-04 LAB — MAGNESIUM: Magnesium: 2 mg/dL (ref 1.7–2.4)

## 2019-08-04 LAB — GLUCOSE, CAPILLARY
Glucose-Capillary: 101 mg/dL — ABNORMAL HIGH (ref 70–99)
Glucose-Capillary: 109 mg/dL — ABNORMAL HIGH (ref 70–99)
Glucose-Capillary: 82 mg/dL (ref 70–99)
Glucose-Capillary: 86 mg/dL (ref 70–99)
Glucose-Capillary: 87 mg/dL (ref 70–99)
Glucose-Capillary: 96 mg/dL (ref 70–99)
Glucose-Capillary: 99 mg/dL (ref 70–99)

## 2019-08-04 NOTE — Progress Notes (Signed)
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  She was discharged from [facility] ED after improvement with diazepam.  Decompensated 2 days later and brought to ED again on 8/10.  She required multiple dose of antipsychotics in ED. Developed rhabdo, AKI and transaminitis thought to be due to antipsychotics. CK elevated to 3000> 4500. AST 76.  ALT 145.  CBC normal.  COVID-19, pregnancy test, ethanol, Tylenol and salicylate level negative.  UDS positive for benzo.  Patient was admitted.  Zyprexa discontinued due to rhabdo.     On 8/18-rapid response due to fever and tachycardia.  Mental status change and ongoing refusal to take p.o. 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  Neurology consulted and had extensive work-up including CT head, MRI head, EEG and CSF without explanation.   9/3-palliative care consulted and met with the patient and patient's mother and had an extensive discussion about treatment options including eternal feeding, ECT, transfer to other facility ([facility], [facility], [facility])  Obtained for transfer by previous provider was not successful.  Finally family agreed to placing cortrack but patient started taking p.o.  08/04/2019: Patient seen.  Seems to have improved significantly.  Awaiting transfer to psychiatric unit.  No new complaints.  Subjective: New complaints  Objective: Vitals:   08/04/19 0034 08/04/19 0433 08/04/19 0855 08/04/19 0900  BP: 99/61 134/80 122/61 122/61  Pulse: 62 72 79 (!) 50  Resp: 20 20    Temp: 98.2 F (36.8 C) 97.8 F (36.6 C)  98.2 F (36.8 C)  TempSrc: Oral Oral  Oral  SpO2: 100% 100%  99%  Weight:      Height:        Intake/Output Summary (Last 24 hours) at  08/04/2019 1135 Last data filed at 08/04/2019 0830 Gross per 24 hour  Intake 1617.98 ml  Output 1725 ml  Net -107.02 ml   Filed Weights   07/24/19 0418 07/26/19 0400 07/31/19 0350  Weight: 86.2 kg 85.7 kg 82.1 kg    Examination:  GENERAL: No acute distress.  Psychomotor retardation. HEENT: Mild pallor.  No jaundice. NECK: Supple.  No apparent JVD.  RESP: Clear to auscultation.   CVS: 1 S2. ABD: Soft and nontender.  Organs are nonpalpable. NEURO: Awake and alert.  Patient was all extremities. PSYCH: Psychomotor retardation  Assessment & Plan: Acute psychosis unspecified, possibly affective: -Improved significantly -Awaiting transfer to psychiatric unit. -Continue management as per psychiatric team. -Haldol 2 mg BID + BID PRN agitation -Continue safety sitter -Monitor QTC and electrolytes -PT/OT/SLP-appreciate recommendations. -Repeat TSH and free T4 4 to 6 weeks after discharge (free T4 was elevated)  Rhabdomyolysis:  Improved.  CK 4k>>300 -Stopped IV fluid-good p.o. intake  Elevated liver enzymes:  Resolving.   -Monitor as appropriate  Hyponatremia/hypochloremia:  Resolved.  Hypokalemia:  Likely due to IV fluid.   Resolved.  Sinus tachycardia:  -Source of bradycardia noted by patient is on beta-blocker  AKI:  Resolved.   Normocytic anemia:  Work-up reveals iron deficiency.   Patient is on iron replacement therapy   Obesity:  -BMI 32. -We will encourage weight loss once mental status improves..  DVT prophylaxis: Subcu Lovenox Code Status: Full code Family Communication: Updated patient's mother over the phone. Consultants: Psychiatry,  neurology (signed off), palliative Disposition: Patient is medically ready for transfer to inpatient psych  Procedures:  None  Microbiology summarized: [id number] negative. Blood cultures negative. CSF cultures negative. Urine culture negative.  Antimicrobials: Anti-infectives (From admission, onward)   Start      Dose/Rate Route Frequency Ordered Stop   07/09/19 1900  Ampicillin-Sulbactam (UNASYN) 3 g in sodium chloride 0.9 % 100 mL IVPB     3 g 200 mL/hr over 30 Minutes Intravenous Every 8 hours 07/09/19 1845 07/16/19 1904      Sch Meds:  Scheduled Meds: . enoxaparin (LOVENOX) injection  40 mg Subcutaneous Q24H  . ferrous sulfate  325 mg Oral Q breakfast  . folic acid  1 mg Oral Daily  . haloperidol  2 mg Oral BID   Or  . haloperidol lactate  2 mg Intramuscular BID  . metoprolol tartrate  12.5 mg Oral BID  . nystatin  5 mL Oral QID  . thiamine  100 mg Oral Daily   Or  . thiamine  100 mg Intravenous Daily   Continuous Infusions:  PRN Meds:.acetaminophen, alum & mag hydroxide-simeth, haloperidol **OR** haloperidol lactate, hydrALAZINE, lip balm, LORazepam, magnesium hydroxide, oxyCODONE   I have personally reviewed the following labs and images: CBC: Recent Labs  Lab 07/31/19 0845 08/02/19 0650  WBC 5.2 5.3  NEUTROABS 2.8  --   HGB 9.6* 10.2*  HCT 30.1* 32.8*  MCV 83.4 84.8  PLT 347 338   BMP &GFR Recent Labs  Lab 07/31/19 0845 08/01/19 0500 08/02/19 0650 08/03/19 0418 08/04/19 0423  NA 138 139 138 138 142  K 3.3* 3.2* 3.8 3.7 4.2  CL 104 104 105 105 109  CO2 _0 GLUCOSE 105* 129* 103* 115* 91  BUN <5* <5* <5* <5* <5*  CREATININE 0.91 0.90 0.91 0.93 0.90  CALCIUM 8.8* 8.7* 9.4 9.4 9.6  MG 1.5* 2.0 1.8 1.7 2.0  PHOS 4.0 4.6 4.3  --   --    Estimated Creatinine Clearance: 96.2 mL/min (by C-G formula based on SCr of 0.9 mg/dL). Liver & Pancreas: Recent Labs  Lab 07/31/19 0845 08/01/19 0500 08/03/19 0418  AST 29 35 40  ALT 58* 62* 74*  ALKPHOS 53 54 108  BILITOT 0.2* 0.5 0.4  PROT 6.4* 6.4* 6.9  ALBUMIN 3.0* 3.0* 3.5   No results for input(s): LIPASE, AMYLASE in the last 168 hours. No results for input(s): AMMONIA in the last 168 hours. Diabetic: No results for input(s): HGBA1C in the last 72 hours. Recent Labs  Lab 08/03/19 1645 08/03/19  2024 08/04/19 0024 08/04/19 0429 08/04/19 0813  GLUCAP 119* 109* 99 87 86   Cardiac Enzymes: Recent Labs  Lab 08/01/19 0500 08/03/19 0418  CKTOTAL 372* 299*   No results for input(s): PROBNP in the last 8760 hours. Coagulation Profile: No results for input(s): INR, PROTIME in the last 168 hours. Thyroid Function Tests: No results for input(s): TSH, T4TOTAL, FREET4, T3FREE, THYROIDAB in the last 72 hours. Lipid Profile: No results for input(s): CHOL, HDL, LDLCALC, TRIG, CHOLHDL, LDLDIRECT in the last 72 hours. Anemia Panel: Recent Labs    08/01/19 1813  VITAMINB12 410  FOLATE 16.9  FERRITIN 27  TIBC 364  IRON 44  RETICCTPCT 2.1   Urine analysis:    Component Value Date/Time   COLORURINE YELLOW 07/09/2019 1115   APPEARANCEUR CLEAR 07/09/2019 1115   LABSPEC 1.006 07/09/2019 1115   PHURINE 5.0 07/09/2019 1115   [place] 07/09/2019 1115  HGBUR MODERATE (A) 07/09/2019 1115   BILIRUBINUR NEGATIVE 07/09/2019 1115   KETONESUR NEGATIVE 07/09/2019 1115   PROTEINUR NEGATIVE 07/09/2019 1115   NITRITE NEGATIVE 07/09/2019 1115   LEUKOCYTESUR NEGATIVE 07/09/2019 1115   Sepsis Labs: Invalid input(s): PROCALCITONIN, [place]  Microbiology: Recent Results (from the past 240 hour(s))  Novel Coronavirus, NAA (hospital order; send-out to ref lab)     Status: None   Collection Time: 07/29/19 10:52 AM   Specimen: Nasopharyngeal Swab; Respiratory  Result Value Ref Range Status   SARS-CoV-2, NAA NOT DETECTED NOT DETECTED Final    Comment: (NOTE) This nucleic acid amplification test was developed and its performance characteristics determined by [employer]. Nucleic acid amplification tests include PCR and TMA. This test has not been FDA cleared or approved. This test has been authorized by FDA under an Emergency Use Authorization (EUA). This test is only authorized for the duration of time the declaration that circumstances exist justifying the  authorization of the emergency use of in vitro diagnostic tests for detection of SARS-CoV-2 virus and/or diagnosis of COVID-19 infection under section 564(b)(1) of the Act, 21 U.S.C. 671IWP-8(K) (1), unless the authorization is terminated or revoked sooner. When diagnostic testing is negative, the possibility of a false negative result should be considered in the context of a patient's recent exposures and the presence of clinical signs and symptoms consistent with COVID-19. An individual without symptoms of COVID- 19 and who is not shedding SARS-CoV-2 vi rus would expect to have a negative (not detected) result in this assay. Performed At: [facility] [address] [place], [place] [id number] Rush Farmer MD NL:[id number]    [place]  Final    Comment: Performed at [facility], [place] [address], [place], [place] [address]    Radiology Studies: No results found.  Bonnell Public, M.D. [facility]  If 7PM-7AM, please contact night-coverage [email or web address] Password [facility] 08/04/2019, 11:35 AM

## 2019-08-04 NOTE — Progress Notes (Signed)
Pt refused prednisone and Bipap.  Educated her of the importance.  Later she agreed to take prednisone. She also agreed to be on Bipap after she had breakfast.  Idolina Primer, RN

## 2019-08-05 ENCOUNTER — Encounter (HOSPITAL_COMMUNITY): Payer: Self-pay

## 2019-08-05 ENCOUNTER — Inpatient Hospital Stay (HOSPITAL_COMMUNITY)
Admission: AD | Admit: 2019-08-05 | Discharge: 2019-08-08 | DRG: 885 | Disposition: A | Discharge status: Home / Self Care | Payer: BC Managed Care – PPO | Attending: Psychiatry | Admitting: Psychiatry

## 2019-08-05 ENCOUNTER — Other Ambulatory Visit: Payer: Self-pay

## 2019-08-05 ENCOUNTER — Encounter (HOSPITAL_COMMUNITY): Payer: Self-pay | Admitting: Internal Medicine

## 2019-08-05 DIAGNOSIS — R45851 Suicidal ideations: Secondary | ICD-10-CM | POA: Diagnosis present

## 2019-08-05 DIAGNOSIS — F329 Major depressive disorder, single episode, unspecified: Secondary | ICD-10-CM | POA: Diagnosis present

## 2019-08-05 DIAGNOSIS — F202 Catatonic schizophrenia: Secondary | ICD-10-CM

## 2019-08-05 DIAGNOSIS — D509 Iron deficiency anemia, unspecified: Secondary | ICD-10-CM | POA: Diagnosis present

## 2019-08-05 DIAGNOSIS — I10 Essential (primary) hypertension: Secondary | ICD-10-CM | POA: Diagnosis present

## 2019-08-05 DIAGNOSIS — R748 Abnormal levels of other serum enzymes: Secondary | ICD-10-CM | POA: Diagnosis present

## 2019-08-05 DIAGNOSIS — F23 Brief psychotic disorder: Secondary | ICD-10-CM | POA: Diagnosis present

## 2019-08-05 DIAGNOSIS — F29 Unspecified psychosis not due to a substance or known physiological condition: Secondary | ICD-10-CM | POA: Diagnosis present

## 2019-08-05 DIAGNOSIS — G2589 Other specified extrapyramidal and movement disorders: Secondary | ICD-10-CM | POA: Diagnosis not present

## 2019-08-05 DIAGNOSIS — R011 Cardiac murmur, unspecified: Secondary | ICD-10-CM

## 2019-08-05 DIAGNOSIS — T50905A Adverse effect of unspecified drugs, medicaments and biological substances, initial encounter: Secondary | ICD-10-CM | POA: Diagnosis not present

## 2019-08-05 LAB — GLUCOSE, CAPILLARY
Glucose-Capillary: 103 mg/dL — ABNORMAL HIGH (ref 70–99)
Glucose-Capillary: 91 mg/dL (ref 70–99)
Glucose-Capillary: 86 mg/dL (ref 70–99)
Glucose-Capillary: 95 mg/dL (ref 70–99)

## 2019-08-05 MED ORDER — ALUM & MAG HYDROXIDE-SIMETH 200-200-20 MG/5ML PO SUSP: 30.0000 mL | ORAL | Status: DC | PRN

## 2019-08-05 MED ORDER — METOPROLOL TARTRATE 25 MG PO TABS: 12.5000 mg | ORAL_TABLET | Freq: Two times a day (BID) | ORAL | 0 refills | Status: AC

## 2019-08-05 MED ORDER — ENOXAPARIN SODIUM 40 MG/0.4ML ~~LOC~~ SOLN
40.0000 mg | SUBCUTANEOUS | Status: DC
  Filled 2019-08-05: qty 0.4

## 2019-08-05 MED ORDER — THIAMINE HCL 100 MG PO TABS: 100.0000 mg | ORAL_TABLET | Freq: Every day | ORAL | 0 refills | Status: AC

## 2019-08-05 MED ORDER — NYSTATIN 100000 UNIT/ML MT SUSP
5.0000 mL | Freq: Four times a day (QID) | OROMUCOSAL | Status: DC
  Administered 2019-08-05 – 2019-08-08 (×10): 500000 [IU] via ORAL
  Filled 2019-08-05 (×19): qty 5

## 2019-08-05 MED ORDER — ENSURE MAX PROTEIN PO LIQD
11.0000 [oz_av] | Freq: Every day | ORAL | Status: DC
  Administered 2019-08-06: 08:00:00 11 [oz_av] via ORAL
  Filled 2019-08-05 (×2): qty 330

## 2019-08-05 MED ORDER — LORAZEPAM 1 MG PO TABS: 1.0000 mg | ORAL_TABLET | Freq: Two times a day (BID) | ORAL | 0 refills | Status: DC | PRN

## 2019-08-05 MED ORDER — HYDROXYZINE HCL 25 MG PO TABS
25.0000 mg | ORAL_TABLET | Freq: Three times a day (TID) | ORAL | Status: DC | PRN
  Administered 2019-08-05 – 2019-08-07 (×2): 25 mg via ORAL
  Filled 2019-08-05: qty 1

## 2019-08-05 MED ORDER — TRAZODONE HCL 50 MG PO TABS
50.0000 mg | ORAL_TABLET | Freq: Every evening | ORAL | Status: DC | PRN
  Administered 2019-08-05 – 2019-08-06 (×2): 50 mg via ORAL
  Filled 2019-08-05 (×9): qty 1

## 2019-08-05 MED ORDER — FERROUS SULFATE 325 (65 FE) MG PO TABS
325.0000 mg | ORAL_TABLET | Freq: Every day | ORAL | Status: DC
  Administered 2019-08-06 – 2019-08-08 (×3): 325 mg via ORAL
  Filled 2019-08-05 (×5): qty 1

## 2019-08-05 MED ORDER — HALOPERIDOL 2 MG PO TABS: 2.0000 mg | ORAL_TABLET | Freq: Two times a day (BID) | ORAL | 0 refills | Status: DC

## 2019-08-05 MED ORDER — FOLIC ACID 1 MG PO TABS
1.0000 mg | ORAL_TABLET | Freq: Every day | ORAL | Status: DC
  Administered 2019-08-06 – 2019-08-08 (×3): 1 mg via ORAL
  Filled 2019-08-05 (×6): qty 1

## 2019-08-05 MED ORDER — ENSURE MAX PROTEIN PO LIQD: 11.0000 [oz_av] | Freq: Every day | ORAL | 0 refills | Status: DC

## 2019-08-05 MED ORDER — LIP MEDEX EX OINT: TOPICAL_OINTMENT | CUTANEOUS | 0 refills | Status: AC | PRN

## 2019-08-05 MED ORDER — FERROUS SULFATE 325 (65 FE) MG PO TABS: 325.0000 mg | ORAL_TABLET | Freq: Every day | ORAL | 0 refills | Status: AC

## 2019-08-05 MED ORDER — ENSURE MAX PROTEIN PO LIQD
11.0000 [oz_av] | Freq: Every day | ORAL | Status: DC
  Administered 2019-08-05: 13:00:00 11 [oz_av] via ORAL
  Filled 2019-08-05: qty 330

## 2019-08-05 MED ORDER — NYSTATIN 100000 UNIT/ML MT SUSP: 5.0000 mL | Freq: Four times a day (QID) | OROMUCOSAL | 0 refills | Status: AC

## 2019-08-05 MED ORDER — HYDRALAZINE HCL 20 MG/ML IJ SOLN
10.0000 mg | Freq: Four times a day (QID) | INTRAMUSCULAR | Status: DC | PRN
  Filled 2019-08-05: qty 0.5

## 2019-08-05 MED ORDER — FOLIC ACID 1 MG PO TABS: 1.0000 mg | ORAL_TABLET | Freq: Every day | ORAL | 0 refills | Status: AC

## 2019-08-05 MED ORDER — LIP MEDEX EX OINT
TOPICAL_OINTMENT | CUTANEOUS | Status: DC | PRN
  Filled 2019-08-05: qty 7

## 2019-08-05 MED ORDER — METOPROLOL TARTRATE 12.5 MG HALF TABLET
12.5000 mg | ORAL_TABLET | Freq: Two times a day (BID) | ORAL | Status: DC
  Administered 2019-08-05 – 2019-08-08 (×6): 12.5 mg via ORAL
  Filled 2019-08-05 (×11): qty 1

## 2019-08-05 MED ORDER — ACETAMINOPHEN 325 MG PO TABS: 650.0000 mg | ORAL_TABLET | Freq: Four times a day (QID) | ORAL | Status: DC | PRN

## 2019-08-05 NOTE — Progress Notes (Signed)
Pt has been accepted to [facility] room 500-1.  Attending is Dr. Jake Samples.  Call report to [phone] prior to her transport to [facility].

## 2019-08-05 NOTE — TOC Transition Note (Addendum)
Transition of Care [facility]) - CM/SW Discharge Note   Patient Details  Name: Veronica Arias MRN: [id number] Date of Birth: 09-10-1991  Transition of Care [facility]) CM/SW Contact:  Eileen Stanford, LCSW Phone Number: 08/05/2019, 3:09 PM   Clinical Narrative:   Pt has been accepted at [facility]. Pt will go to room 500 bed 1. Accepting MD will be Dr. Jake Samples. RN to call report [phone] prior to d/c. [facility] to provide transport. Transportation has been set up.  Final next level of care: Psychiatric Hospital Barriers to Discharge: No Barriers Identified   Patient Goals and CMS Choice        Discharge Placement              Patient chooses bed at: [facility]) Patient to be transferred to facility by: [facility]   Patient and family notified of of transfer: 08/05/19  Discharge Plan and Services   Discharge Planning Services: CM Consult Post Acute Care Choice: NA                               Social Determinants of Health (SDOH) Interventions     Readmission Risk Interventions No flowsheet data found.

## 2019-08-05 NOTE — Discharge Summary (Addendum)
Physician Discharge Summary  Patient ID: Veronica Arias MRN: [id number] DOB/AGE: [birth year] 28 y.o.  Admit date: 07/01/2019 Discharge date: 08/05/2019  Admission Diagnoses:  Discharge Diagnoses:  Active Problems: - Acute psychotic disorder, with paranoia and bizarre behavior.   - Possible schizoaffective disorder.   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  Patient was initially seen at [facility] long psychiatric ED on 06/29/2019 and was discharged.  Patient represented to the hospital 2 days later with acute onset psychosis, suicidal ideation, paranoia and bizarre behaviors.  Patient was admitted for further assessment and management.  Patient's psychiatric symptoms care was directed by the local psychiatrist.  Patient was involuntarily committed to the hospital.  During the hospital stay, patient continued to depict psychotic symptoms; severe depressive symptoms with psychomotor retardation, psychosis, paranoia, mutism with variable and vague degree of catatonic symptoms; though, not typical for catatonic schizophrenia.  No third person auditory hallucination.  No command thoughts or response to external stimuli.  Patient could not be managed fairly from psychiatric point, due mainly, to patient's mother's concern for antipsychotic medication side effects.  Electroconvulsive therapy was offered at some point by the local psychiatrist, but patient's mother refused.  Mother's preference was for patient to be transferred to [facility] system, but [facility] system declined the transfer.  Patient has slowly  improved from psychiatric point of view, but remains significantly psychiatrically ill, to the extent that inpatient psychiatric care remains very necessary.  Medically, patient was noted to have elevated CPK that has improved significantly.  Mild AKI was noted during hospital stay, but has since resolved.  Mild fever of unknown etiology was noted, but has since resolved.  Sinus tachycardia has persisted, necessitating beta-blocker management.  For a long time during the hospital stay, patient refused to eat, and patient's mother refused use of enteral nutrition.  Lately, patient has been more communicative from time to time, and now tolerating diet/oral regular food.   Patient will be discharged to [facility] behavioral health unit for further psychiatric management.    Consults: cardiology, neurology, psychiatry and Palliative care medicine  Significant Diagnostic Studies:  MRI brain with and without contrast came back normal.   Renal ultrasound done for acute kidney injury was normal. CT head without contrast came back normal. CPK was elevated 4536.  Discharge Exam: Blood pressure 116/68, pulse 65, temperature 98.6 F (37 C), temperature source Oral, resp. rate 18, height 5\' 3"  (1.6 m), weight 75.8 kg, SpO2 100 %.  Disposition: Discharge disposition: 02-Transferred to [facility]   Discharge Instructions    Diet - low sodium heart healthy   Complete by: As directed    Increase activity slowly   Complete by: As directed      Allergies as of 08/05/2019   No Known Allergies     Medication List    STOP taking these medications   HM Multivitamin Adult Gummy Chew     TAKE these medications   Ensure Max Protein Liqd Take 330 mLs (11 oz total) by mouth daily for 7 days. Start taking on: August 06, 2019   ferrous sulfate 325 (65 FE) MG tablet Take 1 tablet (325 mg total) by mouth daily with  breakfast. Start taking on: August 06, 2019   folic acid 1 MG  tablet Commonly known as: FOLVITE Take 1 tablet (1 mg total) by mouth daily. Start taking on: August 06, 2019   haloperidol 2 MG tablet Commonly known as: HALDOL Take 1 tablet (2 mg total) by mouth 2 (two) times daily.   lip balm ointment Apply topically as needed for lip care.   LORazepam 1 MG tablet Commonly known as: ATIVAN Take 1 tablet (1 mg total) by mouth 3 times/day as needed-between meals & bedtime for anxiety or sleep.   metoprolol tartrate 25 MG tablet Commonly known as: LOPRESSOR Take 0.5 tablets (12.5 mg total) by mouth 2 (two) times daily.   nystatin 100000 UNIT/ML suspension Commonly known as: MYCOSTATIN Take 5 mLs (500,000 Units total) by mouth 4 (four) times daily for 7 days.   thiamine 100 MG tablet Take 1 tablet (100 mg total) by mouth daily. Start taking on: August 06, 2019        Signed: Barnetta ChapelSylvester I Revella Shelton 08/05/2019, 3:49 PM

## 2019-08-05 NOTE — Progress Notes (Signed)
Nutrition Follow-up  DOCUMENTATION CODES:   Obesity unspecified  INTERVENTION:   -Ensure MAX Protein po daily, each supplement provides 150 kcal and 30 grams of protein  NUTRITION DIAGNOSIS:   Inadequate oral intake related to inability to eat as evidenced by NPO status.  Now on regular diet  GOAL:   Patient will meet greater than or equal to 90% of their needs  Progressing.  MONITOR:   TF tolerance, I & O's, Labs, Weight trends  ASSESSMENT:   28 year old female with medical history significant of asthma who presented to ED on 8/10 for evaluation of suicidal ideation, paranoia, and bizarre behavior with recent ED visit 2 days prior for similar behavior. Per police report; pt found on the balcony of her apartment throwing things, stating she was suicidal and was going to jump. UDS positive for benzodiazepines.  Patient admitted with rhabdomyolysis, persistent tachycardia, agitation/paranoia/hallucinations.  8/26- advanced to dysphagia 1 diet 8/28- pt mom refused cortrak tube placement 9/4 -diet advanced to dysphagia 3   **RD working remotely**  Patient eating much better now. No need for Cortrak tube now. Pt's diet was advanced to regular. 9/13: PO intakes ranged 25-75%. Will order Ensure Max supplement daily for additional protein.  Admission weight: 181 lbs. Current weight: 167 lbs.  I/Os: -1.6L since 8/31  Medications: Ferrous sulfate tablet daily, Folic acid tablet daily, Thiamine tablet daily Labs reviewed: CBGs: 86-91   Diet Order:   Diet Order            Diet regular Room service appropriate? Yes; Fluid consistency: Thin  Diet effective now              EDUCATION NEEDS:   No education needs have been identified at this time  Skin:  Skin Assessment: Reviewed RN Assessment  Last BM:  9/12  Height:   Ht Readings from Last 1 Encounters:  07/06/19 5\' 3"  (1.6 m)    Weight:   Wt Readings from Last 1 Encounters:  08/05/19 75.8 kg    Ideal Body  Weight:  52.3 kg  BMI:  Body mass index is 29.6 kg/m.  Estimated Nutritional Needs:   Kcal:  1800-2000  Protein:  90-105 grams  Fluid:  > 1.8 L  Clayton Bibles, MS, RD, LDN Inpatient Clinical Dietitian Pager: [phone] After Hours Pager: [phone]

## 2019-08-05 NOTE — Progress Notes (Signed)
OT Cancellation Note  Patient Details Name: Veronica Arias MRN: [id number] DOB: 05-13-1991   Cancelled Treatment:    Reason Eval/Treat Not Completed: Other (comment)(Agitation limiting therapy this session) upon arrival, Pt locked self, sitter and mother in room. Security arrived to floor. Pt not appropriate for OT session at this time. Per chart, pt to transport to [facility] later this date. Will continue to follow pt as appropriate.   Dorinda Hill OTR/L Acute Rehabilitation Services Office: [phone]   BTCYEL Y [id number] 08/05/2019, 4:00 PM

## 2019-08-05 NOTE — Progress Notes (Signed)
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 She has no prior psychiatric history prior to 8/8 when she presented to the ED with acute psychosis. Day #30 of acute care hospitalization.   Hospital Course: On 9/3 I provided palliative care consultation due to patient's deteriorated condition that included inability to take PO and patient's mothers reported refusal of cortrak feeding tube and other treatment modalities.After initial consultation, I made multiple recommendations based on a goals of care conversation that I had with her mother and discussed these with Dr. Benny Lennert. After Veronica Arias demonstrated improved mental status and resumed PO intake PC consult was cancelled.  [name] again requested [facility] consult on 9/11. Psychiatry had not seen patient since 8/27 so there were no new recommendations. I spoke with patients mother at length over the phone on 9/11. Her main goal was to get Veronica Arias the psychiatric care that she needs in a specialized setting and was concerned that this was not happening. Additionally she asked me to write down and spell out the name of her condition and wanted to know my thoughts on medications that Veronica Arias was being given. The patient's mother and patient's aunt have also been calling facilities near [place].   Assessment: Veronica Arias has improved to some degree in terms of overt catatonia but she is demonstrating unstable behavior and side effects of antipsychotic medication (excessive salivation, akathesia and tremor). Psychiatry saw her on 9/11 and made recommendations. Per records Kenefic mother is refusing this-but I suspect that she needs a trusted provider and more information.  I continue to recommend specialized inpatient psychiatry care - Veronica Arias needs a dedicated psych team that includes a holistic  approach and includes her mother and family in the treatment plan. We have largely been unable to stabilize her in the acute medical setting.   On 9/11 I placed phone calls to the [facility] and to [facility]. 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 I spoke with [facility] at length about placement issues and urgency-I am available to assist with discussing her case with potential transfer facilities.  I will continue to be available as needed to patient and her mother in terms of navigation, emotional support and empathetic care.   Lane Hacker, DO Palliative Medicine  Time: 35 min Greater than 50%  of this time was spent counseling and coordinating care related to the above assessment and plan.

## 2019-08-05 NOTE — Clinical Social Work Note (Addendum)
CSW contacted [facility] and they have no beds available today. CSW called [facility] and confirmed pt is still on waitlist.  CSW received a call from Psychiatrist stating [facility] is willing to take pt. [facility] confirmed [facility] does have [facility] and can take pt if a bed becomes available.  [place], [place]

## 2019-08-06 DIAGNOSIS — F23 Brief psychotic disorder: Secondary | ICD-10-CM

## 2019-08-06 LAB — GLUCOSE, CAPILLARY: Glucose-Capillary: 61 mg/dL — ABNORMAL LOW (ref 70–99)

## 2019-08-06 MED ORDER — PRENATAL MULTIVITAMIN CH
1.0000 | ORAL_TABLET | Freq: Every day | ORAL | Status: DC
  Administered 2019-08-06 – 2019-08-08 (×3): 1 via ORAL
  Filled 2019-08-06 (×6): qty 1

## 2019-08-06 MED ORDER — RISPERIDONE 2 MG PO TABS
2.0000 mg | ORAL_TABLET | Freq: Two times a day (BID) | ORAL | Status: DC
  Administered 2019-08-06 – 2019-08-07 (×3): 2 mg via ORAL
  Filled 2019-08-06 (×6): qty 1

## 2019-08-06 MED ORDER — ENSURE ENLIVE PO LIQD
237.0000 mL | Freq: Every day | ORAL | Status: DC
  Administered 2019-08-06 – 2019-08-08 (×3): 237 mL via ORAL

## 2019-08-06 MED ORDER — TEMAZEPAM 30 MG PO CAPS
30.0000 mg | ORAL_CAPSULE | Freq: Every day | ORAL | Status: DC
  Administered 2019-08-06 – 2019-08-07 (×2): 30 mg via ORAL
  Filled 2019-08-06: qty 1

## 2019-08-06 MED ORDER — OMEGA-3-ACID ETHYL ESTERS 1 G PO CAPS
1.0000 g | ORAL_CAPSULE | Freq: Two times a day (BID) | ORAL | Status: DC
  Administered 2019-08-06 – 2019-08-08 (×5): 1 g via ORAL
  Filled 2019-08-06 (×11): qty 1

## 2019-08-06 MED ORDER — BENZTROPINE MESYLATE 0.5 MG PO TABS
0.5000 mg | ORAL_TABLET | Freq: Two times a day (BID) | ORAL | Status: DC
  Administered 2019-08-06 – 2019-08-08 (×5): 0.5 mg via ORAL
  Filled 2019-08-06 (×11): qty 1

## 2019-08-06 NOTE — Progress Notes (Signed)
Recreation Therapy Notes  INPATIENT RECREATION THERAPY ASSESSMENT  Patient Details Name: Gissella Niblack MRN: [id number] DOB: [birth year] Today's Date: 08/06/2019       Information Obtained From: Patient  Able to Participate in Assessment/Interview: Yes  Patient Presentation: Alert  Reason for Admission (Per Patient): Patient Unable to Identify  Patient Stressors: Work  Coping Skills:   Building control surveyor, Journal, Music, Exercise, Deep Breathing, Meditate, Talk, Art, Avoidance, Read, Hot Bath/Shower  Leisure Interests (2+):  Exercise - Walking, Art - Draw, Art - Paint  Frequency of Recreation/Participation: Other (Comment)(Daily)  Awareness of Community Resources:  Yes  Community Resources:  [place], Patent examiner, Engineer, drilling  Current Use: Yes  If no, Barriers?:    Expressed Interest in [place]: No  [employer] of Residence:  [place]  Patient Main Form of Transportation: Musician  Patient Strengths:  Creativity  Patient Identified Areas of Improvement:  Consistency; Motivation  Patient Goal for Hospitalization:  "to leave"  Current SI (including self-harm):  No  Current HI:  No  Current AVH: No  Staff Intervention Plan: Group Attendance, Collaborate with Interdisciplinary Treatment Team  Consent to Intern Participation: N/A    Victorino Sparrow, LRT/CTRS  Victorino Sparrow A 08/06/2019, 1:41 PM

## 2019-08-06 NOTE — Progress Notes (Signed)
Patient denies SI, HI and AVH this shift.  Patient has had no incidents of behavioral dyscontrol.    Assess patient for safety, offer medications as prescribed, engage patient in 1:1 staff talks.  Patient able to contract for safety.  Continue to monitor as planned.

## 2019-08-06 NOTE — Progress Notes (Signed)
Adult Psychoeducational Group Note  Date:  08/06/2019 Time:  9:01 PM  Group Topic/Focus:  Wrap-Up Group:   The focus of this group is to help patients review their daily goal of treatment and discuss progress on daily workbooks.  Participation Level:  Minimal  Participation Quality:  Appropriate  Affect:  Appropriate  Cognitive:  Appropriate  Insight: Limited  Engagement in Group:  Developing/Improving  Modes of Intervention:  Discussion  Additional Comments:  Pt stated her goal for today was to focus on her treatment. Pt stated she felt she accomplished her goal today. Pt stated her relationship with her family has improved since she was admitted here. Pt stated her mother came for visitation today, which improved her day.  Pt stated she felt better about herself today. Pt rated her overall day a 6 out 10. Pt stated her appetite was improving today. Pt stated her goal for tonight is to get  good night's rest. Pt stated she had pain in her stomach tonight. Pt nurse was made aware of the situation. Pt stated if anything change she would alert staff.  Candy Sledge 08/06/2019, 9:01 PM

## 2019-08-06 NOTE — Progress Notes (Signed)
Admission Note:  28 yr female who presents IVC in no acute distress for the treatment of SI / psychosis and Depression. Pt was a search and bring back from 1st shift. Pt appears flat and depressed. Pt was calm and cooperative with admission process. 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 Pt poor historian , not remebering much that got her here. Admission completed.   A: POC and unit policies explained and understanding verbalized. Consents obtained. Food and fluids offered, and fluids accepted.   R:Pt had no additional questions or concerns.
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
## 2019-08-06 NOTE — H&P (Signed)
Psychiatric Admission Assessment Adult  Veronica Arias Identification: Veronica BaileyKyla Arias MRN:  [id number] Date of Evaluation:  08/06/2019 Chief Complaint:  Psychosis Principal Diagnosis: New onset psychosis in the context of negative drug screen/generally high functioning individual/no past psychiatric history/some medical comorbidities that have been addressed/negative CT scan Diagnosis:  Active Problems:   Psychosis (HCC)  History of Present Illness:   This is the first psychiatric admission here or elsewhere for Veronica Arias, transferred from the medical floor for further stabilization. The Veronica Arias initially presented in early August specifically 8/8 then re-presented on 8/10 with new onset psychosis-mother confirms the Veronica Arias has no prior psychiatric history, however she has a maternal and paternal cousin who have been diagnosed with schizophrenia.  The Veronica Arias self is unclear as to why she is hospitalized she is alert and oriented to person place situation day so forth but is very vague when questioned basically has no recall of the events leading to the hospitalization.  She has at times been mute while in the hospital and at times refuse certain aspects of care but she has no recall of this either.  She had been admitted to the medical floor essentially for medical work-up and also for protection of her renal system given the elevated CK noted, by 9/12 it was down to 299.  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  Veronica Arias was initially seen at [facility] ED on 06/29/2019 and was discharged.  Veronica Arias represented to the hospital 2 days later with acute onset psychosis, suicidal ideation, paranoia and bizarre behaviors.  Veronica Arias was admitted for further assessment and management.  Veronica Arias's psychiatric symptoms care was directed by the local psychiatrist.  Veronica Arias was involuntarily committed to the hospital.   During the hospital stay, Veronica Arias continued to depict psychotic symptoms; severe depressive symptoms with psychomotor retardation, psychosis, paranoia, mutism with variable and vague degree of catatonic symptoms; though, not typical for catatonic schizophrenia.  No third person auditory hallucination.  No command thoughts or response to external stimuli.  Veronica Arias could not be managed fairly from psychiatric point, due mainly, to Veronica Arias's mother's concern for antipsychotic medication side effects.  Electroconvulsive therapy was offered at some point by the local psychiatrist, but Veronica Arias's mother refused.  Mother's preference was for Veronica Arias to be transferred to [facility], but [facility] declined the transfer.  Veronica Arias has slowly improved from psychiatric point of view, but remains significantly psychiatrically ill, to the extent that inpatient psychiatric care remains very necessary.  Medically, Veronica Arias was noted to have elevated CPK that has improved significantly.  Mild AKI was noted during hospital stay, but has since resolved.  Mild fever of unknown etiology was noted, but has since resolved.  Sinus tachycardia has persisted, necessitating beta-blocker management.  For a long time during the hospital stay, Veronica Arias refused to eat, and Veronica Arias's mother refused use of enteral nutrition.  Lately, Veronica Arias has been more communicative from time to time, and now tolerating diet/oral regular food.   Veronica Arias will be discharged to [facility] for further psychiatric management.    Associated Signs/Symptoms: Depression Symptoms:  psychomotor retardation, (Hypo) Manic Symptoms:  Distractibility, Anxiety Symptoms:  denies Psychotic Symptoms:  see eval PTSD Symptoms: NA Total Time spent with Veronica Arias: 45 minutes  Past Psychiatric History: neg  Is the Veronica Arias at risk to self? Yes.    Has the Veronica Arias been a risk to self in the past  6 months? No.  Has the  Veronica Arias been a risk to self within the distant past? No.  Is the Veronica Arias a risk to others? No.  Has the Veronica Arias been a risk to others in the past 6 months? No.  Has the Veronica Arias been a risk to others within the distant past? No.   Prior Inpatient Therapy:   Prior Outpatient Therapy:    Alcohol Screening: 1. How often do you have a drink containing alcohol?: Never 2. How many drinks containing alcohol do you have on a typical day when you are drinking?: 1 or 2 3. How often do you have six or more drinks on one occasion?: Never AUDIT-C Score: 0 4. How often during the last year have you found that you were not able to stop drinking once you had started?: Never 5. How often during the last year have you failed to do what was normally expected from you becasue of drinking?: Never 6. How often during the last year have you needed a first drink in the morning to get yourself going after a heavy drinking session?: Never 7. How often during the last year have you had a feeling of guilt of remorse after drinking?: Never 8. How often during the last year have you been unable to remember what happened the night before because you had been drinking?: Never 9. Have you or someone else been injured as a result of your drinking?: No 10. Has a relative or friend or a doctor or another health worker been concerned about your drinking or suggested you cut down?: No Alcohol Use Disorder Identification Test Final Score (AUDIT): 0 Substance Abuse History in the last 12 months:  No. Consequences of Substance Abuse: NA Previous Psychotropic Medications: No  Psychological Evaluations: No  Past Medical History:  Past Medical History:  Diagnosis Date  . Asthma   . Psychosis (HCC) 07/06/2019    Past Surgical History:  Procedure Laterality Date  . NO PAST SURGERIES     Family History: History reviewed. No pertinent family history. Family Psychiatric  History: 2 cousins have schizophrenia - (one paternal side  one maternal)- niece has nonspecific issues  Tobacco Screening: Have you used any form of tobacco in the last 30 days? (Cigarettes, Smokeless Tobacco, Cigars, and/or Pipes): No Social History:  Social History   Substance and Sexual Activity  Alcohol Use No     Social History   Substance and Sexual Activity  Drug Use No    Additional Social History:                           Allergies:  No Known Allergies Lab Results:  Results for orders placed or performed during the hospital encounter of 07/01/19 (from the past 48 hour(s))  Glucose, capillary     Status: None   Collection Time: 08/04/19 11:24 AM  Result Value Ref Range   Glucose-Capillary 96 70 - 99 mg/dL  Glucose, capillary     Status: None   Collection Time: 08/04/19  5:55 PM  Result Value Ref Range   Glucose-Capillary 82 70 - 99 mg/dL  Glucose, capillary     Status: Abnormal   Collection Time: 08/04/19  7:42 PM  Result Value Ref Range   Glucose-Capillary 101 (H) 70 - 99 mg/dL  Glucose, capillary     Status: Abnormal   Collection Time: 08/04/19 11:34 PM  Result Value Ref Range   Glucose-Capillary 103 (H) 70 - 99 mg/dL  Glucose, capillary     Status: None   Collection Time: 08/05/19  4:25 AM  Result Value Ref Range   Glucose-Capillary 91 70 - 99 mg/dL  Glucose, capillary     Status: None   Collection Time: 08/05/19  7:43 AM  Result Value Ref Range   Glucose-Capillary 86 70 - 99 mg/dL  Glucose, capillary     Status: None   Collection Time: 08/05/19 12:49 PM  Result Value Ref Range   Glucose-Capillary 95 70 - 99 mg/dL    Blood Alcohol level:  Lab Results  Component Value Date   ETH <10 07/01/2019   ETH <10 06/29/2019    Metabolic Disorder Labs:  No results found for: HGBA1C, MPG No results found for: PROLACTIN No results found for: CHOL, TRIG, HDL, CHOLHDL, VLDL, LDLCALC  Current Medications: Current Facility-Administered Medications  Medication Dose Route Frequency Provider Last Rate Last  Dose  . acetaminophen (TYLENOL) tablet 650 mg  650 mg Oral Q6H PRN Rankin, Shuvon B, NP      . alum & mag hydroxide-simeth (MAALOX/MYLANTA) 200-200-20 MG/5ML suspension 30 mL  30 mL Oral Q4H PRN Rankin, Shuvon B, NP      . feeding supplement (ENSURE ENLIVE) (ENSURE ENLIVE) liquid 237 mL  237 mL Oral Daily Malvin Johns, MD   237 mL at 08/06/19 0953  . ferrous sulfate tablet 325 mg  325 mg Oral Q breakfast Rankin, Shuvon B, NP   325 mg at 08/06/19 0816  . folic acid (FOLVITE) tablet 1 mg  1 mg Oral Daily Rankin, Shuvon B, NP   1 mg at 08/06/19 0816  . hydrOXYzine (ATARAX/VISTARIL) tablet 25 mg  25 mg Oral TID PRN Jackelyn Poling, NP   25 mg at 08/05/19 2226  . lip balm (CARMEX) ointment   Topical PRN Rankin, Shuvon B, NP      . metoprolol tartrate (LOPRESSOR) tablet 12.5 mg  12.5 mg Oral BID Rankin, Shuvon B, NP   12.5 mg at 08/06/19 0816  . nystatin (MYCOSTATIN) 100000 UNIT/ML suspension 500,000 Units  5 mL Oral QID Rankin, Shuvon B, NP   500,000 Units at 08/06/19 0816  . traZODone (DESYREL) tablet 50 mg  50 mg Oral QHS,MR X 1 Nira Conn A, NP   50 mg at 08/05/19 2226   PTA Medications: Medications Prior to Admission  Medication Sig Dispense Refill Last Dose  . Ensure Max Protein (ENSURE MAX PROTEIN) LIQD Take 330 mLs (11 oz total) by mouth daily for 7 days. 2310 mL 0   . ferrous sulfate 325 (65 FE) MG tablet Take 1 tablet (325 mg total) by mouth daily with breakfast. 60 tablet 0   . folic acid (FOLVITE) 1 MG tablet Take 1 tablet (1 mg total) by mouth daily. 30 tablet 0   . haloperidol (HALDOL) 2 MG tablet Take 1 tablet (2 mg total) by mouth 2 (two) times daily. 60 tablet 0   . lip balm (CARMEX) ointment Apply topically as needed for lip care. 7 g 0   . LORazepam (ATIVAN) 1 MG tablet Take 1 tablet (1 mg total) by mouth 3 times/day as needed-between meals & bedtime for anxiety or sleep. 30 tablet 0   . metoprolol tartrate (LOPRESSOR) 25 MG tablet Take 0.5 tablets (12.5 mg total) by mouth 2 (two)  times daily. 60 tablet 0   . nystatin (MYCOSTATIN) 100000 UNIT/ML suspension Take 5 mLs (500,000 Units total) by mouth 4 (four) times daily for 7 days. 60 mL 0   .  thiamine 100 MG tablet Take 1 tablet (100 mg total) by mouth daily. 30 tablet 0     Musculoskeletal: Strength & Muscle Tone: within normal limits Gait & Station: normal Veronica Arias leans: N/A  Psychiatric Specialty Exam: Physical Exam  Nursing note and vitals reviewed. Constitutional: She appears well-developed and well-nourished.  Cardiovascular: Normal rate and regular rhythm.    Review of Systems  Constitutional: Negative.   HENT: Negative.   Eyes: Negative.   Respiratory: Negative.   Cardiovascular: Negative.   Gastrointestinal: Negative.   Musculoskeletal: Negative.   Skin: Negative.   Neurological: Negative.   Endo/Heme/Allergies: Negative.     Blood pressure 120/78, pulse (!) 105, temperature 98.7 F (37.1 C), temperature source Oral, resp. rate 18, last menstrual period 07/05/2019.There is no height or weight on file to calculate BMI.  General Appearance: Casual  Eye Contact:  Fair  Speech:  Slow  Volume:  Decreased  Mood:  Dysphoric  Affect:  Congruent  Thought Process:  Descriptions of Associations: Circumstantial  Orientation:  Full (Time, Place, and Person)  Thought Content:  Rumination and Tangential  Suicidal Thoughts:  No  Homicidal Thoughts:  No  Memory:  Immediate;   Fair Recent;   Fair  Judgement:  Impaired  Insight:  Lacking  Psychomotor Activity:  Decreased  Concentration:  Concentration: Poor and Attention Span: Poor  Recall:  [place] of Knowledge:  Good  Language:  Good  Akathisia:  Negative  Handed:  Right  AIMS (if indicated):     Assets:  Armed forces logistics/support/administrative officer Housing Leisure Time Social Support Talents/Skills  ADL's:  Intact  Cognition:  WNL  Sleep:  Number of Hours: 0.5    Treatment Plan Summary: Daily contact with Veronica Arias to assess and evaluate symptoms and progress  in treatment and Medication management  Observation Level/Precautions:  15 minute checks  Laboratory:  UDS  Psychotherapy: Cognitive and reality based  Medications: Begin Risperdal for psychosis and begin neuroprotective measures  Consultations: Now medically stable  Discharge Concerns: Diagnostic clarity long-term stability job security  Estimated LOS: 5-7  Other: 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, rule out schizophreniform disorder Axis II deferred Axis III elevated CK on admission tachycardia through hospital stay now on beta-blocker negative CT scan   Physician Treatment Plan for Primary Diagnosis: For psychotics and neuro protection for psychosis Long Term Goal(s): Improvement in symptoms so as ready for discharge  Short Term Goals: Ability to identify changes in lifestyle to reduce recurrence of condition will improve, Ability to verbalize feelings will improve, Ability to disclose and discuss suicidal ideas, Ability to demonstrate self-control will improve, Ability to identify and develop effective coping behaviors will improve, Ability to maintain clinical measurements within normal limits will improve and Compliance with prescribed medications will improve  Physician Treatment Plan for Secondary Diagnosis: Active Problems:   Psychosis ([place])  Long Term Goal(s): Improvement in symptoms so as ready for discharge  Short Term Goals: Ability to disclose and discuss suicidal ideas, Ability to demonstrate self-control will improve, Ability to identify and develop effective coping behaviors will improve, Ability to maintain clinical measurements within normal limits will improve and Compliance with prescribed medications will improve  I certify that inpatient services furnished can reasonably be expected to improve the Veronica Arias's condition.    Johnn Hai, MD 9/15/202010:08 AM

## 2019-08-06 NOTE — Tx Team (Signed)
Initial Treatment Plan 08/06/2019 4:15 AM Jeral Pinch ANV:[id number]    PATIENT STRESSORS: Medication change or noncompliance Occupational concerns   PATIENT STRENGTHS: Average or above average intelligence General fund of knowledge Motivation for treatment/growth Supportive family/friends   PATIENT IDENTIFIED PROBLEMS:  psychosis  Risk for suicide  Depression / Anxiety  " help with coping and stress"               DISCHARGE CRITERIA:  Improved stabilization in mood, thinking, and/or behavior Verbal commitment to aftercare and medication compliance  PRELIMINARY DISCHARGE PLAN: Attend PHP/IOP Outpatient therapy  PATIENT/FAMILY INVOLVEMENT: This treatment plan has been presented to and reviewed with the patient, Veronica Arias.  The patient and family have been given the opportunity to ask questions and make suggestions.  Providence Crosby, RN 08/06/2019, 4:15 AM

## 2019-08-06 NOTE — BHH Suicide Risk Assessment (Signed)
[facility] INPATIENT:  Family/Significant Other Suicide Prevention Education  Suicide Prevention Education:  Patient Refusal for Family/Significant Other Suicide Prevention Education: The patient Veronica Arias has refused to provide written consent for family/significant other to be provided Family/Significant Other Suicide Prevention Education during admission and/or prior to discharge.  Physician notified.  SPE completed with pt, as pt refused to consent to family contact. SPI pamphlet provided to pt and pt was encouraged to share information with support network, ask questions, and talk about any concerns relating to SPE. Pt denies access to guns/firearms and verbalized understanding of information provided. Mobile Crisis information also provided to pt.    [place] MSW LCSW 08/06/2019, 10:20 AM
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
## 2019-08-06 NOTE — Progress Notes (Signed)
Recreation Therapy Notes  Date: 9.15.20 Time: 1000 Location: 500 Hall Dayroom  Group Topic: Communication  Goal Area(s) Addresses:  Patient will effectively communicate with peers in group.  Patient will verbalize benefit of healthy communication. Patient will verbalize positive effect of healthy communication on post d/c goals.  Patient will identify communication techniques that made activity effective for group.   Intervention:  Pencils, Paper, Geometrical pictures  Activity:  Geometrical Drawings.  One member of the group described a geometrical drawing to the group.  The person describing the picture was to be as detailed as possible.  The remaining members of the group could only ask the speaker to repeat themselves.  They could not ask any detailed questions.  This process will be repeated 2 more times with two different pictures and 2 new presenters.  Education: Communication, Discharge Planning  Education Outcome: Acknowledges understanding/In group clarification offered/Needs additional education.   Clinical Observations/Feedback:  Pt did not attend group.    Victorino Sparrow, LRT/CTRS        Victorino Sparrow A 08/06/2019 11:48 AM

## 2019-08-06 NOTE — Progress Notes (Signed)
D: Pt denies SI/HI/VH , +ve AH. Pt is pleasant and cooperative. Pt still apprehensive on the unit, but pt stated she felt a whole lot better. Pt did not endorse some of the things done at the ED, but stated she was starting to remeber some of what happened in the ED.  A: Pt was offered support and encouragement. Pt was given scheduled medications. Pt was encourage to attend groups. Q 15 minute checks were done for safety.  R:Pt attends groups and interacts cautisously with peers and staff. Pt is taking medication. Pt has no complaints.Pt receptive to treatment and safety maintained on unit.

## 2019-08-07 MED ORDER — VORTIOXETINE HBR 10 MG PO TABS
10.0000 mg | ORAL_TABLET | Freq: Every day | ORAL | Status: DC
  Administered 2019-08-07: 13:00:00 10 mg via ORAL
  Filled 2019-08-07 (×4): qty 1

## 2019-08-07 MED ORDER — RISPERIDONE 3 MG PO TABS
3.0000 mg | ORAL_TABLET | Freq: Every day | ORAL | Status: DC
  Filled 2019-08-07 (×2): qty 1

## 2019-08-07 MED ORDER — TRAZODONE HCL 100 MG PO TABS
100.0000 mg | ORAL_TABLET | Freq: Every evening | ORAL | Status: DC | PRN
  Administered 2019-08-07: 100 mg via ORAL
  Filled 2019-08-07: qty 1

## 2019-08-07 NOTE — Plan of Care (Signed)
D: Pt denies SI/HI. Patient is a little guarded and isolative to room this morning. She appears to be preoccupied in her thoughts. No RIS noted.  A: Pt was offered support and encouragement. Pt was given scheduled medications. Pt was encourage to attend groups. Q 15 minute checks were done for safety.  R: Pt is taking medication. Pt has no complaints.Pt receptive to treatment and safety maintained on unit.    Problem: Coping: Goal: Ability to demonstrate self-control will improve Outcome: Progressing Note: Patient behavior has been appropriate on the unit   Problem: Safety: Goal: Periods of time without injury will increase Outcome: Progressing Note: Patient denies SI and remains safe on unit. Monitoring continues.   Problem: Activity: Goal: Sleeping patterns will improve Outcome: Not Progressing Note: Patient slept 3.5 hours does nap doing the day

## 2019-08-07 NOTE — Progress Notes (Signed)
Adult Psychoeducational Group Note  Date:  08/07/2019 Time:  8:30 PM  Group Topic/Focus:  Wrap-Up Group:   The focus of this group is to help patients review their daily goal of treatment and discuss progress on daily workbooks.  Participation Level:  Active  Participation Quality:  Appropriate  Affect:  Appropriate  Cognitive:  Alert  Insight: Appropriate  Engagement in Group:  Engaged  Modes of Intervention:  Discussion  Additional Comments:  Patient stated her day was moderate. Patient's goal for today was to start being more social. Patient met her goal.  Veronica Arias L Mercede Rollo 08/07/2019, 8:30 PM

## 2019-08-07 NOTE — Progress Notes (Signed)
[facility] MD Progress Note  08/07/2019 9:17 AM  Patient in bed generally passive but she is alert and oriented affect is constricted but she does show some ability to have a range.  She denies current thoughts of harming self or others, expresses a little bit of confusion as to reasons for her hospitalization.  Were unclear as to whether she is a depression with psychosis or primarily a psychotic disorder however it would be very atypical to have the disorganized behavior and events leading to hospitalization simply in the context of depression   Diagnosis:  Axis I: Psychotic Disorder NOS  ADL's:  Intact  Sleep:  Yes,  AEB:  Suicidal Ideation:  Negative                Plan                Intent                      Means           Homicidal Ideation:   Negative   Plan:  No  Intent:  No  Means:  No  AEB (as evidenced by):  Mental Status: General Appearance / Behavior:  Casual and Guarded Eye Contact:  Fair Motor Behavior:  Normal Speech:  Rate:Slow Level of Consciousness:  Alert Mood:  Euthymic Affect:  Appropriate Anxiety Level:  None Thought Process:  Coherent Thought Content:  WNL Perception:  Normal Judgment:  Fair Insight:  Present Cognition:  Orientation time, place and person Memory Recent Concentration Yes Sleep:  Number of Hours: 3.75  Vital Signs:Blood pressure 118/70, pulse 90, temperature 98.6 F (37 C), temperature source Oral, resp. rate 18, last menstrual period 07/05/2019.  Lab Results:  Results for orders placed or performed during the hospital encounter of 08/05/19 (from the past 48 hour(s))  Glucose, capillary     Status: Abnormal   Collection Time: 08/06/19 10:09 AM  Result Value Ref Range   Glucose-Capillary 61 (L) 70 - 99 mg/dL   Comment 1 Repeat Test     Physical Findings: AIMS: Facial and Oral Movements Muscles of Facial Expression: None, normal Lips and Perioral Area: None, normal Jaw: None, normal Tongue: None, normal,Extremity  Movements Upper (arms, wrists, hands, fingers): None, normal Lower (legs, knees, ankles, toes): None, normal, Trunk Movements Neck, shoulders, hips: None, normal, Overall Severity Severity of abnormal movements (highest score from questions above): None, normal Incapacitation due to abnormal movements: None, normal Patient's awareness of abnormal movements (rate only patient's report): No Awareness, Dental Status Current problems with teeth and/or dentures?: No Does patient usually wear dentures?: No  CIWA:    COWS:     Treatment Plan Summary: Daily contact with patient to assess and evaluate symptoms and progress in treatment Medication management  Continue Risperidone 2mg  BID for psychosis Continue Temazepam 30 mg Q nightly, trazodone 50mg  Q nightly for sleep    Plan: Observation Level/Precautions:  15 minute checks  Laboratory:  Serum CK   Psychotherapy: Cognitive and reality based  Medications: Continue Risperdal for psychosis and neuroprotective measures  Consultations: Now medically stable  Discharge Concerns: Diagnostic clarity long-term stability job security  Estimated LOS: 5-7  Other: 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, rule out schizophreniform disorder Axis II deferred Axis III elevated CK on admission tachycardia through hospital stay now on beta-blocker negative CT scan   Also, I think that she may have a depressive component and this may be a psychotic  depression although it would be atypical as far as her initial presentation but to be on the safe side we will start vortioxetine   Emmie Niemannhristopher Jacob Reguyal, MS3

## 2019-08-07 NOTE — BHH Group Notes (Signed)
Occupational Therapy Group Note  Date:  08/07/2019 Time:  4:19 PM  Group Topic/Focus:  Personal Choices and Values:   The focus of this group is to help patients assess and explore the importance of values in their lives, how their values affect their decisions, how they express their values and what opposes their expression.  Participation Level:  Active  Participation Quality:  Appropriate  Affect:  Not Congruent  Cognitive:  Alert  Insight: Improving  Engagement in Group:  Engaged  Modes of Intervention:  Activity, Discussion, Education and Socialization  Additional Comments:  Pt known to this Probation officer from recent medical admission at [facility] hospital. Pt affect and participation much improved from previous times. Pt very engaged in activity, answering reflective questions and socializing appropriately with peers. 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  Zenovia Jarred, MSOT, OTR/L Behavioral Health OT/ Acute Relief OT PHP Office: [place] 08/07/2019, 4:19 PM

## 2019-08-08 MED ORDER — TEMAZEPAM 30 MG PO CAPS: 30.0000 mg | ORAL_CAPSULE | Freq: Every day | ORAL | 0 refills | Status: AC

## 2019-08-08 MED ORDER — VORTIOXETINE HBR 10 MG PO TABS: 10.0000 mg | ORAL_TABLET | Freq: Every day | ORAL | 5 refills | Status: AC

## 2019-08-08 MED ORDER — ENBRACE HR PO CAPS: 1.0000 | ORAL_CAPSULE | Freq: Every day | ORAL | 11 refills | Status: AC

## 2019-08-08 MED ORDER — OMEGA-3-ACID ETHYL ESTERS 1 G PO CAPS: 1.0000 g | ORAL_CAPSULE | Freq: Two times a day (BID) | ORAL | 6 refills | Status: AC

## 2019-08-08 MED ORDER — RISPERIDONE 2 MG PO TABS: 2.0000 mg | ORAL_TABLET | Freq: Every day | ORAL | 1 refills | Status: AC

## 2019-08-08 MED ORDER — BENZTROPINE MESYLATE 0.5 MG PO TABS: 0.5000 mg | ORAL_TABLET | Freq: Two times a day (BID) | ORAL | 2 refills | Status: AC

## 2019-08-08 NOTE — Progress Notes (Signed)
Patient ID: Veronica Arias, female   DOB: 11-01-1991, 28 y.o.   MRN: [id number]   [place] NOVEL CORONAVIRUS (COVID-19) DAILY CHECK-OFF SYMPTOMS - answer yes or no to each - every day NO YES  Have you had a fever in the past 24 hours?  . Fever (Temp > 37.80C / 100F) X   Have you had any of these symptoms in the past 24 hours? . New Cough .  Sore Throat  .  Shortness of Breath .  Difficulty Breathing .  Unexplained Body Aches   X   Have you had any one of these symptoms in the past 24 hours not related to allergies?   . Runny Nose .  Nasal Congestion .  Sneezing   X   If you have had runny nose, nasal congestion, sneezing in the past 24 hours, has it worsened?  X   EXPOSURES - check yes or no X   Have you traveled outside the state in the past 14 days?  X   Have you been in contact with someone with a confirmed diagnosis of COVID-19 or PUI in the past 14 days without wearing appropriate PPE?  X   Have you been living in the same home as a person with confirmed diagnosis of COVID-19 or a PUI (household contact)?    X   Have you been diagnosed with COVID-19?    X              What to do next: Answered NO to all: Answered YES to anything:   Proceed with unit schedule Follow the [facility] Inpatient Flowsheet.

## 2019-08-08 NOTE — Plan of Care (Signed)
Pt was able to identify positive coping strategies at completion of recreation therapy group sessions.   Quran Vasco, LRT/CTRS  

## 2019-08-08 NOTE — Discharge Summary (Signed)
Physician Discharge Summary Note  Patient:  Veronica Arias is an 28 y.o., female MRN:  [id number] DOB:  1991-11-18 Patient phone:  [phone] (home)  Patient address:   Uncertain [place] [address],  Total Time spent with patient: 45 minutes  Date of Admission:  08/05/2019 Date of Discharge: 08/08/2019  Reason for Admission:     The patient self is unclear as to why she is hospitalized she is alert and oriented to person place situation day so forth but is very vague when questioned basically has no recall of the events leading to the hospitalization.  She has at times been mute while in the hospital and at times refuse certain aspects of care but she has no recall of this either.  She had been admitted to the medical floor essentially for medical work-up and also for protection of her renal system given the elevated CK noted, by 9/12 it was down to 299.  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 Patient was initially seen at [facility] long psychiatric ED on 06/29/2019 and was discharged. Patient represented to the hospital 2 days later withacute onset psychosis, suicidal ideation, paranoia and bizarre behaviors. Patient was admitted for further assessment and management. Patient'spsychiatric symptoms care was directed by the local psychiatrist. Patient was involuntarily committed to the hospital. During the hospital stay, patient continued to depictpsychotic symptoms;severe depressive symptoms with psychomotor retardation, psychosis, paranoia, mutism with variable and vague degree of catatonic symptoms;though, not typical for catatonic schizophrenia. No third personauditory hallucination. No command thoughts orresponse to external stimuli. Patient could not be managed fairly from psychiatric point,due mainly,to patient's mother'sconcern for antipsychoticmedication side  effects. Electroconvulsive therapy was offered at some point by the local psychiatrist, but patient's mother refused. Mother's preference was for patient to be transferred to [facility] system, but [facility] system declined the transfer. Patient has slowly improved from psychiatric point of view, but remains significantly psychiatrically ill,to the extent that inpatient psychiatric care remains very necessary. Medically, patient was noted to have elevated CPK that has improved significantly. Mild AKI was noted during hospital stay, but has since resolved. Mild fever of unknown etiology was noted, but has since resolved. Sinus tachycardia has persisted, necessitating beta-blocker management. For a long time during the hospital stay, patient refused to eat, and patient's mother refused use of enteral nutrition. Lately, patient has been more communicative from time to time, and nowtolerating diet/oral regular food.Patient will be discharged to [facility] behavioral health unit for further psychiatric management.   Principal Problem: <principal problem not specified> Discharge Diagnoses: Active Problems:   Psychosis [facility])   Past Psychiatric History: neg  Past Medical History:  Past Medical History:  Diagnosis Date  . Asthma   . Psychosis ([place]) 07/06/2019    Past Surgical History:  Procedure Laterality Date  . NO PAST SURGERIES     Family History: History reviewed. No pertinent family history. Family Psychiatric  History: neg Social History:  Social History   Substance and Sexual Activity  Alcohol Use No     Social History   Substance and Sexual Activity  Drug Use No    Social History   Socioeconomic History  . Marital status: Single    Spouse name: Not on file  . Number of children: Not on file  . Years of education: Not on file  . Highest education level: Not on file  Occupational History  .  Not on file  Social Needs  . Financial resource  strain: Not on file  . Food insecurity    Worry: Not on file    Inability: Not on file  . Transportation needs    Medical: Not on file    Non-medical: Not on file  Tobacco Use  . Smoking status: Never Smoker  . Smokeless tobacco: Never Used  Substance and Sexual Activity  . Alcohol use: No  . Drug use: No  . Sexual activity: Not Currently  Lifestyle  . Physical activity    Days per week: Not on file    Minutes per session: Not on file  . Stress: Not on file  Relationships  . Social Musicianconnections    Talks on phone: Not on file    Gets together: Not on file    Attends religious service: Not on file    Active member of club or organization: Not on file    Attends meetings of clubs or organizations: Not on file    Relationship status: Not on file  Other Topics Concern  . Not on file  Social History Narrative  . Not on file    Hospital Course:    As discussed patient required medical stabilization prior to being transferred to us, and she had displayed a confusional state a disorganized state of mind prior to coming in also some depressive symptoms.  What was unusual was that her psychosis/state of agitation upon presentation was simply not typical of a patient with major depression with psychotic features.  However her drug screen was negative and we did not further find evidence for schizophreniform type condition so in addition to the initial Risperdal prescription we did add an antidepressant.  We continue to engage in cognitive and reality-based therapies.  She was always eager for discharge she displayed no danger behaviors was generally passive and dysphoric.  By the date of the 17th she was alert oriented and cooperative affect still a bit constricted and mood was a still a bit dysthymic but she had no suicidal thoughts and her thoughts were clear, she could contract fully and had no psychosis.  So she now meets the criteria to continue her care on the outpatient  basis.  Physical Findings: AIMS: Facial and Oral Movements Muscles of Facial Expression: None, normal Lips and Perioral Area: None, normal Jaw: None, normal Tongue: None, normal,Extremity Movements Upper (arms, wrists, hands, fingers): None, normal Lower (legs, knees, ankles, toes): None, normal, Trunk Movements Neck, shoulders, hips: None, normal, Overall Severity Severity of abnormal movements (highest score from questions above): None, normal Incapacitation due to abnormal movements: None, normal Patient's awareness of abnormal movements (rate only patient's report): No Awareness, Dental Status Current problems with teeth and/or dentures?: No Does patient usually wear dentures?: No  CIWA:    COWS:     Musculoskeletal: Strength & Muscle Tone: within normal limits Gait & Station: normal Patient leans: N/A  Psychiatric Specialty Exam: ROS  Blood pressure 130/80, pulse (!) 101, temperature 98.6 F (37 C), temperature source Oral, resp. rate 20, last menstrual period 07/05/2019.There is no height or weight on file to calculate BMI.  General Appearance: Casual  Eye Contact::  Good  Speech:  Clear and Coherent409  Volume:  Decreased  Mood:  Dysphoric  Affect:  Flat  Thought Process:  Coherent  Orientation:  Full (Time, Place, and Person)  Thought Content:  Rumination  Suicidal Thoughts:  No  Homicidal Thoughts:  No  Memory:  Recent;  Fair Remote;   Fair  Judgement:  Intact  Insight:  Fair  Psychomotor Activity:  Normal  Concentration:  Good  Recall:  Good  Fund of Knowledge:Good  Language: Good  Akathisia:  Negative  Handed:  Right  AIMS (if indicated):     Assets:  Communication Skills Desire for Improvement  Sleep:  Number of Hours: 4  Cognition: WNL  ADL's:  Intact      Have you used any form of tobacco in the last 30 days? (Cigarettes, Smokeless Tobacco, Cigars, and/or Pipes): No  Has this patient used any form of tobacco in the last 30 days?  (Cigarettes, Smokeless Tobacco, Cigars, and/or Pipes) Yes, No  Blood Alcohol level:  Lab Results  Component Value Date   ETH <10 07/01/2019   ETH <10 06/29/2019    Metabolic Disorder Labs:  No results found for: HGBA1C, MPG No results found for: PROLACTIN No results found for: CHOL, TRIG, HDL, CHOLHDL, VLDL, LDLCALC  See Psychiatric Specialty Exam and Suicide Risk Assessment completed by Attending Physician prior to discharge.  Discharge destination:  Home  Is patient on multiple antipsychotic therapies at discharge:  No   Has Patient had three or more failed trials of antipsychotic monotherapy by history:  No  Recommended Plan for Multiple Antipsychotic Therapies: NA   Allergies as of 08/08/2019   No Known Allergies     Medication List    STOP taking these medications   Ensure Max Protein Liqd   haloperidol 2 MG tablet Commonly known as: HALDOL   LORazepam 1 MG tablet Commonly known as: ATIVAN     TAKE these medications     Indication  benztropine 0.5 MG tablet Commonly known as: COGENTIN Take 1 tablet (0.5 mg total) by mouth 2 (two) times daily.  Indication: Extrapyramidal Reaction caused by Medications   EnBrace HR Caps Take 1 capsule by mouth daily.  Indication: 21-Hydroxylase Deficiency   ferrous sulfate 325 (65 FE) MG tablet Take 1 tablet (325 mg total) by mouth daily with breakfast.  Indication: Anemia From Inadequate Iron in the Body   folic acid 1 MG tablet Commonly known as: FOLVITE Take 1 tablet (1 mg total) by mouth daily.  Indication: Anemia From Inadequate Folic Acid   lip balm ointment Apply topically as needed for lip care.  Indication: 21-Hydroxylase Deficiency   metoprolol tartrate 25 MG tablet Commonly known as: LOPRESSOR Take 0.5 tablets (12.5 mg total) by mouth 2 (two) times daily.  Indication: High Blood Pressure Disorder   nystatin 100000 UNIT/ML suspension Commonly known as: MYCOSTATIN Take 5 mLs (500,000 Units total) by  mouth 4 (four) times daily for 7 days.  Indication: Candidiasis Fungal Infection of the Oropharynx   omega-3 acid ethyl esters 1 g capsule Commonly known as: LOVAZA Take 1 capsule (1 g total) by mouth 2 (two) times daily.  Indication: High Amount of Triglycerides in the Blood   risperiDONE 2 MG tablet Commonly known as: RISPERDAL Take 1 tablet (2 mg total) by mouth at bedtime.  Indication: Major Depressive Disorder   temazepam 30 MG capsule Commonly known as: RESTORIL Take 1 capsule (30 mg total) by mouth at bedtime.  Indication: Trouble Sleeping   thiamine 100 MG tablet Take 1 tablet (100 mg total) by mouth daily.  Indication: Deficiency of Vitamin B1   vortioxetine HBr 10 MG Tabs tablet Commonly known as: TRINTELLIX Take 1 tablet (10 mg total) by mouth daily. If not on formulary may substitute fluoxetine 20 mg a day #  90 1 R  Indication: Major Depressive Disorder      Follow-up Information    [facility] Follow up on 08/13/2019.   Why: Therapy with Jeryl ColumbiaGreg Moore is Tuesday, 9/22 at 4:00p.  Medication management with is Thursday, 9/24 at 9:00a.  Appts will be virtual. An email with instructions will be sent to you.  Please call within 24 hours of discharge to confirm appts and pay $20 deposit Contact information: [address] [place] [id number] [phone]          Signed: Malvin JohnsFARAH,Airyn Ellzey, MD 08/08/2019, 7:44 AM

## 2019-08-08 NOTE — Progress Notes (Signed)
D: Pt denies SI/HI/VH, +ve AH- getting better overall - still come and go. Pt is pleasant and cooperative. Pt continues to be paranoid on the unit.  A: Pt was offered support and encouragement. Pt was given scheduled medications. Pt was encourage to attend groups. Q 15 minute checks were done for safety.  R: safety maintained on unit.

## 2019-08-08 NOTE — Progress Notes (Signed)
Recreation Therapy Notes  Date:  9.17.20 Time: 1000 Location: 500 Hall Dayroom  Group Topic: Coping Skills  Goal Area(s) Addresses:  Patient will identify positive coping skills. Patient will identify barriers to using positive coping skills.  Behavioral Response:  Engaged  Intervention:  Worksheet, pencils  Activity: Healthy vs. Unhealthy Coping Strategies.  Patient were to identify unhealthy coping strategies and consequences to those strategies.  Patients would then identify positive coping strategies, outcomes and barriers to using those positive coping strategies.  Education: Radiographer, therapeutic, Dentist.   Education Outcome: Acknowledges understanding/In group clarification offered/Needs additional education.   Clinical Observations/Feedback:  Pt stated some a negative coping skill could be isolation and a positive coping skill could be exercise.  Pt identified a current problem of removing negative thoughts.  Pt stated her unhealthy coping strategies were over/under eating and buying unnecessary things.  Pt identified the consequences as weight gain, loss of self esteem, spoiled food and overcrowding.  Pt identified positive coping skills as speaking up for herself again, better diet and stop over thinking.  Pt identified barriers as being shy, anxiety, start a 30/70 budget and continue to work on over thinking.       Victorino Sparrow, LRT/CTRS     Victorino Sparrow A 08/08/2019 11:36 AM

## 2019-08-08 NOTE — Progress Notes (Signed)
Recreation Therapy Notes  INPATIENT RECREATION TR PLAN  Patient Details Name: Chudney Scheffler MRN: [id number] DOB: 1991-10-28 Today's Date: 08/08/2019  Rec Therapy Plan Is patient appropriate for Therapeutic Recreation?: Yes Treatment times per week: about 3 days Estimated Length of Stay: 5-7 days TR Treatment/Interventions: Group participation (Comment)  Discharge Criteria Pt will be discharged from therapy if:: Discharged Treatment plan/goals/alternatives discussed and agreed upon by:: Patient/family  Discharge Summary Short term goals set: See patient care plan Short term goals met: Complete Progress toward goals comments: Groups attended Which groups?: Coping skills Reason goals not met: None Therapeutic equipment acquired: N/A Reason patient discharged from therapy: Discharge from hospital Pt/family agrees with progress & goals achieved: Yes Date patient discharged from therapy: 08/08/19   Victorino Sparrow, LRT/CTRS  Ria Comment, [place] Tomeo A 08/08/2019, 11:59 AM

## 2019-08-08 NOTE — BHH Suicide Risk Assessment (Signed)
[facility] Discharge Suicide Risk Assessment   Principal Problem: New onset confusional state/psychosis/ depression Discharge Diagnoses: Active Problems:   Psychosis ([place])   Total Time spent with patient: 45 minutes  Musculoskeletal: Strength & Muscle Tone: within normal limits Gait & Station: normal Patient leans: N/A  Psychiatric Specialty Exam: ROS  Blood pressure 130/80, pulse (!) 101, temperature 98.6 F (37 C), temperature source Oral, resp. rate 20, last menstrual period 07/05/2019.There is no height or weight on file to calculate BMI.  General Appearance: Casual  Eye Contact::  Good  Speech:  Clear and [id number]  Volume:  Decreased  Mood:  Dysphoric  Affect:  Flat  Thought Process:  Coherent  Orientation:  Full (Time, Place, and Person)  Thought Content:  Rumination  Suicidal Thoughts:  No  Homicidal Thoughts:  No  Memory:  Recent;   Fair Remote;   Fair  Judgement:  Intact  Insight:  Fair  Psychomotor Activity:  Normal  Concentration:  Good  Recall:  Good  Fund of Knowledge:Good  Language: Good  Akathisia:  Negative  Handed:  Right  AIMS (if indicated):     Assets:  Communication Skills Desire for Improvement  Sleep:  Number of Hours: 4  Cognition: WNL  ADL's:  Intact   Mental Status Per Nursing Assessment::   On Admission:  NA  Demographic Factors:  Living alone  Loss Factors: NA  Historical Factors: NA  Risk Reduction Factors:   Sense of responsibility to family, Religious beliefs about death and Employed  Continued Clinical Symptoms:  Dysthymia  Cognitive Features That Contribute To Risk:  None    Suicide Risk:  Minimal: No identifiable suicidal ideation.  Patients presenting with no risk factors but with morbid ruminations; may be classified as minimal risk based on the severity of the depressive symptoms  [place] Follow up on 08/13/2019.   Why: Therapy with Wende Bushy is Tuesday, 9/22 at 4:00p.   Medication management with is Thursday, 9/24 at 9:00a.  Appts will be virtual. An email with instructions will be sent to you.  Please call within 24 hours of discharge to confirm appts and pay $20 deposit Contact information: [place] [address] [phone]           Plan Of Care/Follow-up recommendations:  Activity:  Misty Stanley, MD 08/08/2019, 7:43 AM

## 2019-08-08 NOTE — Progress Notes (Cosign Needed)
  [facility] Adult Case Management Discharge Plan :  Will you be returning to the same living situation after discharge:  Yes,  Her apartment  At discharge, do you have transportation home?: Yes,  Mother will pick pt up Do you have the ability to pay for your medications: Yes,  Has insurance  Release of information consent forms completed and in the chart;  Patient's signature needed at discharge.  Patient to Follow up at: [place], [facility] Follow up on 08/13/2019.   Why: Therapy with Wende Bushy is Tuesday, 9/22 at 4:00p.  Medication management with is Thursday, 9/24 at 9:00a.  Appts will be virtual. An email with instructions will be sent to you.  Please call within 24 hours of discharge to confirm appts and pay $20 deposit Contact information: [place] [address] [phone]           Next level of care provider has access to [place] and Suicide Prevention discussed: Yes,  Reviewed with pt  Have you used any form of tobacco in the last 30 days? (Cigarettes, Smokeless Tobacco, Cigars, and/or Pipes): No  Has patient been referred to the Quitline?: Patient refused referral  Patient has been referred for addiction treatment: Yes  Billey Chang, Student-Social Work 08/08/2019, 9:13 AM

## 2019-08-08 NOTE — Progress Notes (Signed)
Patient ID: Veronica Arias, female   DOB: July 15, 1991, 28 y.o.   MRN: [id number]    D: Pt alert and oriented on the unit.   A: Education, support, and encouragement provided. Discharge summary, medications and follow up appointments reviewed with pt. Suicide prevention resources provided, including "My 3 App." Pt's belongings in locker returned and belongings sheet signed.  R: Pt denies SI/HI, A/VH, pain, or any concerns at this time. Pt ambulatory on and off unit. Pt discharged to lobby.

## 2019-08-08 NOTE — Discharge Summary (Signed)
Physician Discharge Summary Note  Patient:  Veronica Arias is an 28 y.o., female  MRN:  [id number]  DOB:  1991-10-23  Patient phone:  [phone] (home)   Patient address:   [place] [address],   Total Time spent with patient: Greater than 30 minutes  Date of Admission:  08/05/2019  Date of Discharge: 08-08-19  Reason for Admission: Worsening psychosis.  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 Asthma   . Psychosis ([place]) 07/06/2019    Past Surgical History:  Procedure Laterality Date  . NO PAST SURGERIES     Family History: History reviewed. No pertinent family history.  Family Psychiatric  History: See H&P  Social History:  Social History   Substance and Sexual Activity  Alcohol Use No     Social History   Substance and Sexual Activity  Drug Use No    Social History   Socioeconomic History  . Marital status: Single    Spouse name: Not on file  . Number of children: Not on file  . Years of education: Not on file  . Highest education level: Not on file  Occupational History  . Not on file  Social Needs  . Financial resource strain: Not on file  . Food insecurity    Worry: Not on file    Inability: Not on file  . Transportation needs    Medical: Not on file    Non-medical: Not on file  Tobacco Use  . Smoking status: Never Smoker  . Smokeless tobacco: Never Used  Substance and Sexual Activity  . Alcohol use: No  . Drug use: No  . Sexual activity: Not Currently  Lifestyle  . Physical activity    Days per week: Not on file    Minutes per session: Not on file  . Stress: Not on file  Relationships  . Social Herbalist on phone: Not on file    Gets together: Not on file     Attends religious service: Not on file    Active member of club or organization: Not on file    Attends meetings of clubs or organizations: Not on file    Relationship status: Not on file  Other Topics Concern  . Not on file  Social History Narrative  . Not on file   Hospital Course: (Per Md's admission evaluation): This is the first psychiatric admission here or elsewhere for Ms. Deshotel, transferred from the medical floor for further stabilization. The patient initially presented in early August specifically 8/8 then re-presented on 8/10 with new onset psychosis-mother confirms the patient has no prior psychiatric history, however she has a maternal and paternal cousin who have been diagnosed with schizophrenia. The patient self is unclear as to why she is hospitalized she is alert and oriented to person place situation day so forth but is very vague when questioned basically has no recall of the events leading to the hospitalization.  She has at times been mute while in the hospital and at times refuse certain aspects of care but she has no recall of this either. She had been admitted to the medical floor essentially for medical work-up and also for protection of her renal system given the elevated CK noted, by 9/12 it was down to 299.  After the above admission evaluation, Hoa was recommended for mood stabilization treatments based on her presenting symptoms. The medication management for her presenting symptoms were discussed & initiated. She received, stabilized & was discharged on the medications as listed on the discharge medication lists. She was enrolled & encouraged to attend & participate in the group counseling sessions being offered & held on this unit. She learned coping skills. Samaia presented other significant pre-existing medical issues that required treatment. She resumed & discharged on her medications for those health issues. She tolerated her treatment regimen without any adverse  effects or reactions reported.  Finlee's symptoms responded well to her treatment regimen. Her symptoms has subsided & mood stable. Patient has met the maximum benefit of her hospitalization. She is currently mentally & medically stable to continue mental health care & medication management on an outpatient basis as noted below. She is provided with all the necessary information needed to make this appointment without problems.   Upon discharge, Peg adamantly denies any suicidal/homicidal ideations, auditory/visual hallucinations delusional thinking or paranoia. She feels that her medications have been helpful & she is in agreement to continue her current treatment regimen. She was able to engage in safety planning including plan to return to [facility] or contact emergency services if she feels unable to maintain her own safety or the safety of others. Pt had no further questions, comments, or concerns. She left [facility] with all personal belongings in no apparent distress. Transportation per family (Mother).  Physical Findings: AIMS: Facial and Oral Movements Muscles of Facial Expression: None, normal Lips and Perioral Area: None, normal Jaw: None, normal Tongue: None, normal,Extremity Movements Upper (arms, wrists, hands, fingers): None, normal Lower (legs, knees, ankles, toes): None, normal, Trunk Movements Neck, shoulders, hips: None, normal, Overall Severity Severity of abnormal movements (highest score from questions above): None, normal Incapacitation due to abnormal movements: None, normal Patient's awareness of abnormal movements (rate only patient's report): No Awareness, Dental Status Current problems with teeth and/or dentures?: No Does patient usually wear dentures?: No  CIWA:    COWS:     Musculoskeletal: Strength & Muscle Tone: within normal limits Gait & Station: normal Patient leans: N/A  Psychiatric Specialty Exam: Physical Exam  Nursing note and vitals reviewed. Constitutional:  She is oriented to person, place, and time. She appears well-developed.  Cardiovascular: Normal rate.  Respiratory: Effort normal.  GI: Soft.  Genitourinary:    Genitourinary Comments: Deferred   Musculoskeletal: Normal range of motion.  Neurological: She is alert and oriented to person, place, and time.  Skin: Skin is warm and dry.    Review of Systems  Constitutional: Negative for chills and fever.  Respiratory: Negative for cough, shortness of breath and wheezing.   Cardiovascular: Negative for chest pain and palpitations.  Gastrointestinal: Negative for nausea and vomiting.  Neurological: Negative for dizziness and headaches.  Psychiatric/Behavioral: Positive for depression (Stabilized with medication prior to discharge), hallucinations (Hx of psychosis (Stabilized with medication prior to discharge.) and substance abuse (Hx. Benzodiazepine use disorder). Negative for memory loss and suicidal ideas. The patient has insomnia (Stabilized with medication prior to discharge). The patient is not nervous/anxious (Stable).     Blood pressure 123/68, pulse 98, temperature 98.6 F (37 C), temperature source Oral, resp. rate 20, last menstrual period 07/05/2019.There is no height or weight on file to calculate BMI.  See Md's discharge SRA   Have you used any form of tobacco in the last 30 days? (Cigarettes, Smokeless Tobacco, Cigars, and/or Pipes):  No  Has this patient used any form of tobacco in the last 30 days? (Cigarettes, Smokeless Tobacco, Cigars, and/or Pipes): N/A  Blood Alcohol level:  Lab Results  Component Value Date   ETH <10 07/01/2019   ETH <10 37/85/8850   Metabolic Disorder Labs:  No results found for: HGBA1C, MPG No results found for: PROLACTIN No results found for: CHOL, TRIG, HDL, CHOLHDL, VLDL, LDLCALC  See Psychiatric Specialty Exam and Suicide Risk Assessment completed by Attending Physician prior to discharge.  Discharge destination:  Home  Is patient on  multiple antipsychotic therapies at discharge:  No   Has Patient had three or more failed trials of antipsychotic monotherapy by history:  No  Recommended Plan for Multiple Antipsychotic Therapies: NA  Allergies as of 08/08/2019   No Known Allergies     Medication List    STOP taking these medications   Ensure Max Protein Liqd   haloperidol 2 MG tablet Commonly known as: HALDOL   LORazepam 1 MG tablet Commonly known as: ATIVAN     TAKE these medications     Indication  benztropine 0.5 MG tablet Commonly known as: COGENTIN Take 1 tablet (0.5 mg total) by mouth 2 (two) times daily.  Indication: Extrapyramidal Reaction caused by Medications   EnBrace HR Caps Take 1 capsule by mouth daily.  Indication: 21-Hydroxylase Deficiency   ferrous sulfate 325 (65 FE) MG tablet Take 1 tablet (325 mg total) by mouth daily with breakfast.  Indication: Anemia From Inadequate Iron in the Body   folic acid 1 MG tablet Commonly known as: FOLVITE Take 1 tablet (1 mg total) by mouth daily.  Indication: Anemia From Inadequate Folic Acid   lip balm ointment Apply topically as needed for lip care.  Indication: 21-Hydroxylase Deficiency   metoprolol tartrate 25 MG tablet Commonly known as: LOPRESSOR Take 0.5 tablets (12.5 mg total) by mouth 2 (two) times daily.  Indication: High Blood Pressure Disorder   nystatin 100000 UNIT/ML suspension Commonly known as: MYCOSTATIN Take 5 mLs (500,000 Units total) by mouth 4 (four) times daily for 7 days.  Indication: Candidiasis Fungal Infection of the Oropharynx   omega-3 acid ethyl esters 1 g capsule Commonly known as: LOVAZA Take 1 capsule (1 g total) by mouth 2 (two) times daily.  Indication: High Amount of Triglycerides in the Blood   risperiDONE 2 MG tablet Commonly known as: RISPERDAL Take 1 tablet (2 mg total) by mouth at bedtime.  Indication: Major Depressive Disorder   temazepam 30 MG capsule Commonly known as: RESTORIL Take 1  capsule (30 mg total) by mouth at bedtime.  Indication: Trouble Sleeping   thiamine 100 MG tablet Take 1 tablet (100 mg total) by mouth daily.  Indication: Deficiency of Vitamin B1   vortioxetine HBr 10 MG Tabs tablet Commonly known as: TRINTELLIX Take 1 tablet (10 mg total) by mouth daily. If not on formulary may substitute fluoxetine 20 mg a day # 90 1 R  Indication: Major Depressive Disorder      Follow-up [place], [facility] Follow up on 08/13/2019.   Why: Therapy with Wende Bushy is Tuesday, 9/22 at 4:00p.  Medication management with is Thursday, 9/24 at 9:00a.  Appts will be virtual. An email with instructions will be sent to you.  Please call within 24 hours of discharge to confirm appts and pay $20 deposit Contact information: [place] [address] [phone]          Follow-up recommendations:  Activity:  As tolerated Diet: As recommended by your primary care doctor. Keep all scheduled follow-up appointments as recommended.  Comments: Patient is instructed prior to discharge to: Take all medications as prescribed by his/her mental healthcare provider. Report any adverse effects and or reactions from the medicines to his/her outpatient provider promptly. Patient has been instructed & cautioned: To not engage in alcohol and or illegal drug use while on prescription medicines. In the event of worsening symptoms, patient is instructed to call the crisis hotline, 911 and or go to the nearest ED for appropriate evaluation and treatment of symptoms. To follow-up with his/her primary care provider for your other medical issues, concerns and or health care needs.   Signed: Lindell Spar, NP, PMHNP, FNP-BC 08/08/2019, 1:12 PM

## 2020-10-23 IMAGING — DX PORTABLE CHEST - 1 VIEW
1 series · 1 of 1 positions shown · non-contrast
Comparison: 12/02/2013

CLINICAL DATA: Tachycardia

EXAM:
PORTABLE CHEST 1 VIEW

[chest]
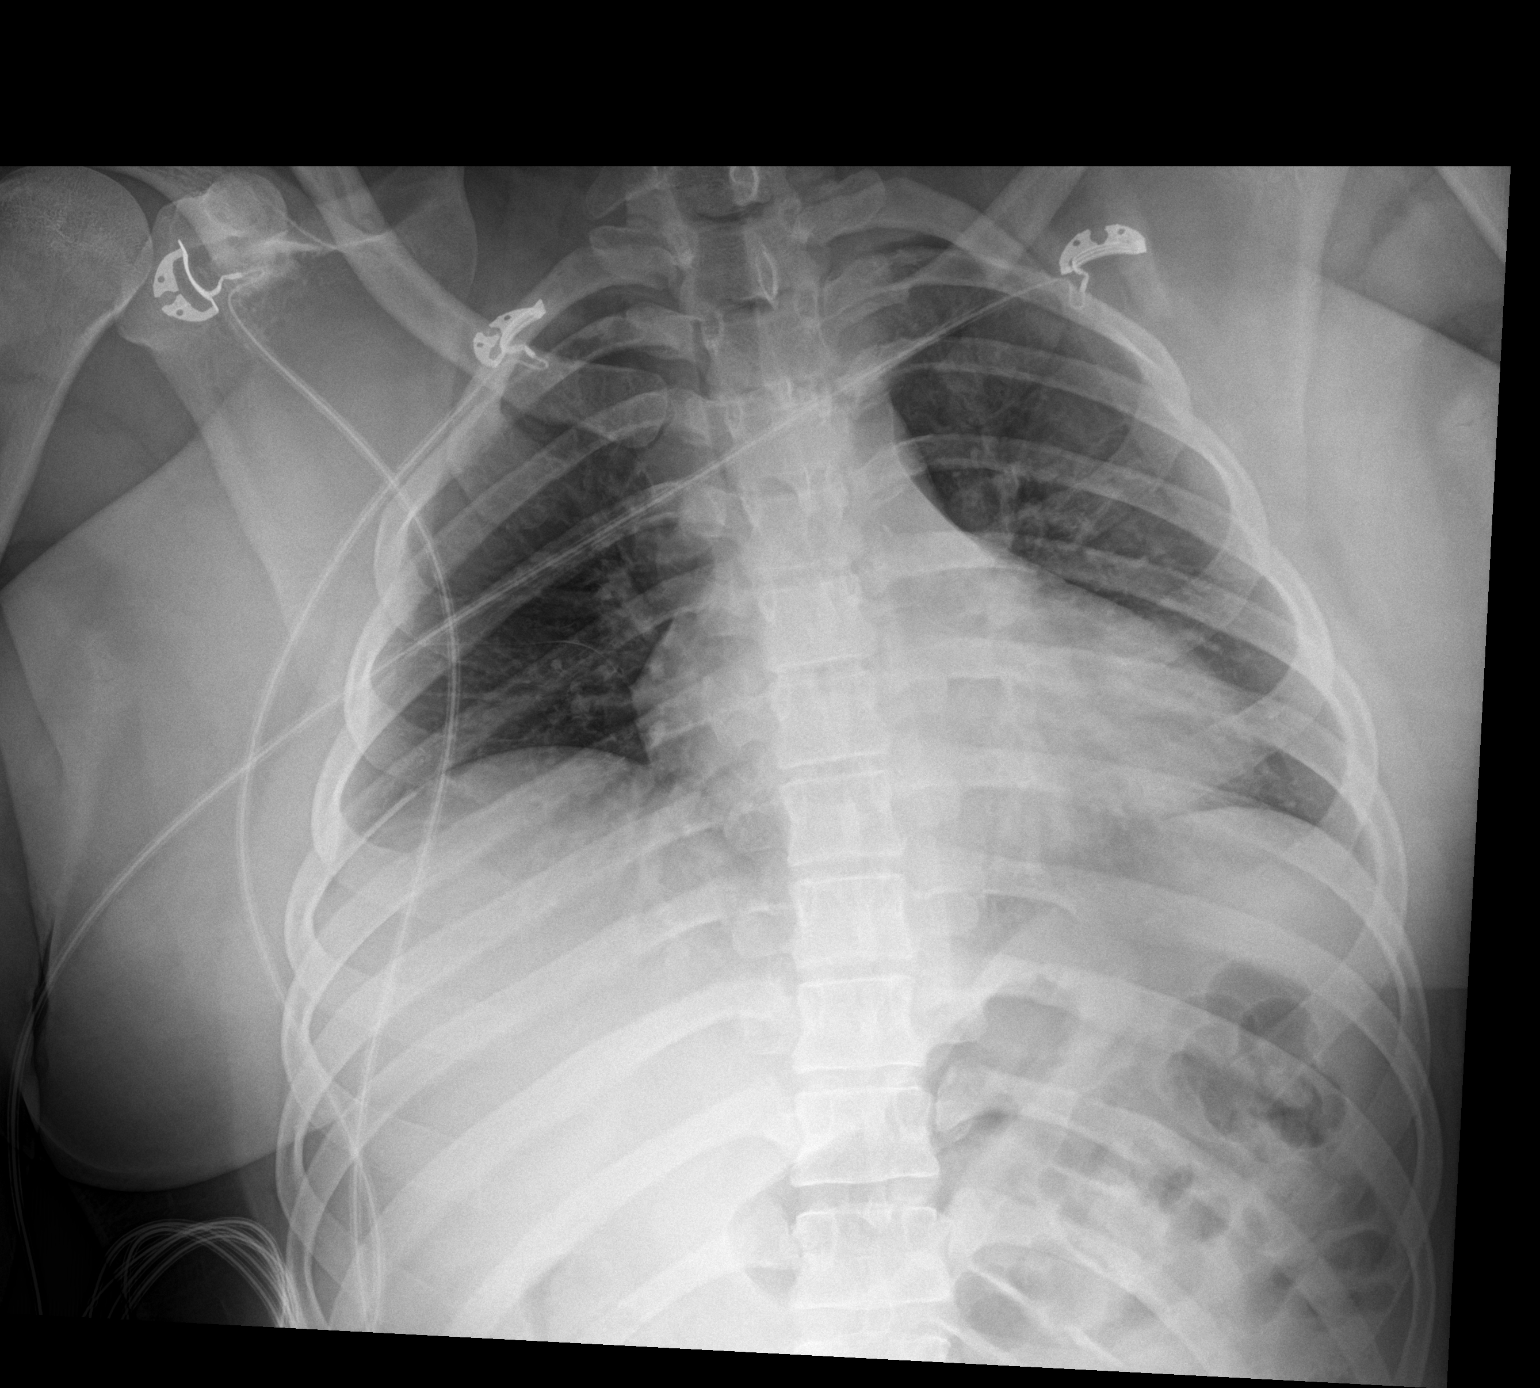

[1 of 1 positions shown; findings below may reference images not displayed]

FINDINGS: The heart size and mediastinal contours are within normal limits.
Both lungs are clear. The visualized skeletal structures are
unremarkable.
IMPRESSION: No active disease.

## 2020-10-26 IMAGING — US US RENAL
1 series · 14 of 25 positions shown · non-contrast
Comparison: None.

CLINICAL DATA: Acute renal injury

EXAM:
RENAL / URINARY TRACT ULTRASOUND COMPLETE

[Series 1: us renal · 14 of 31 slices shown]
[im 1/31]
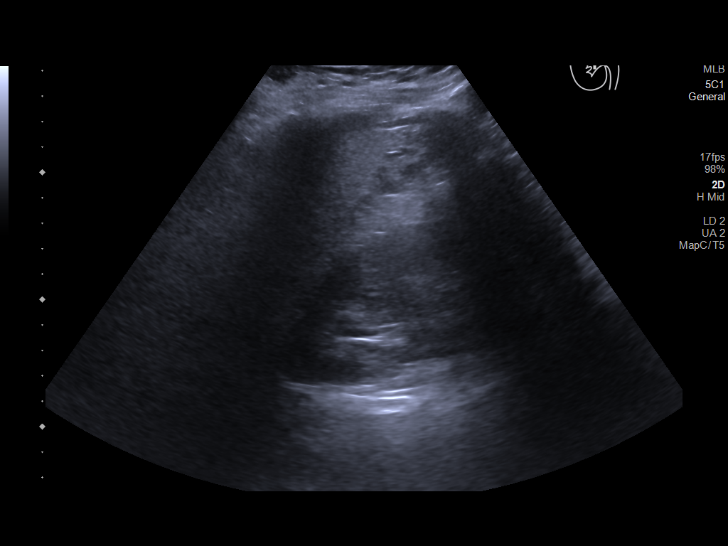
[im 3/31]
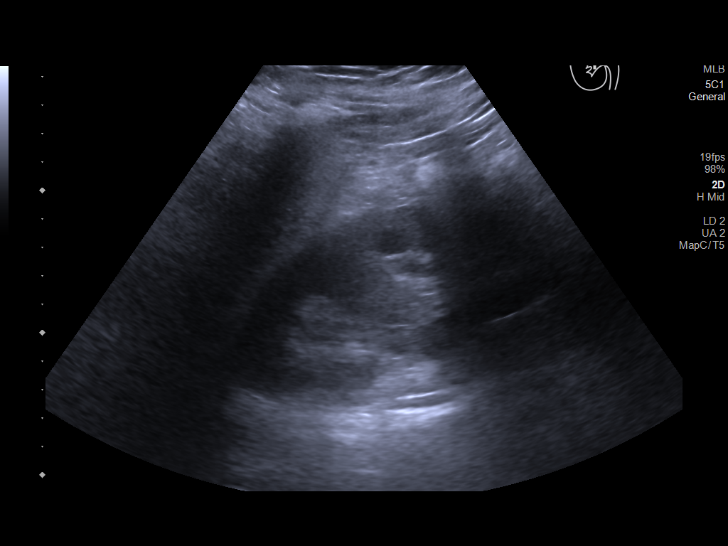
[im 6/31]
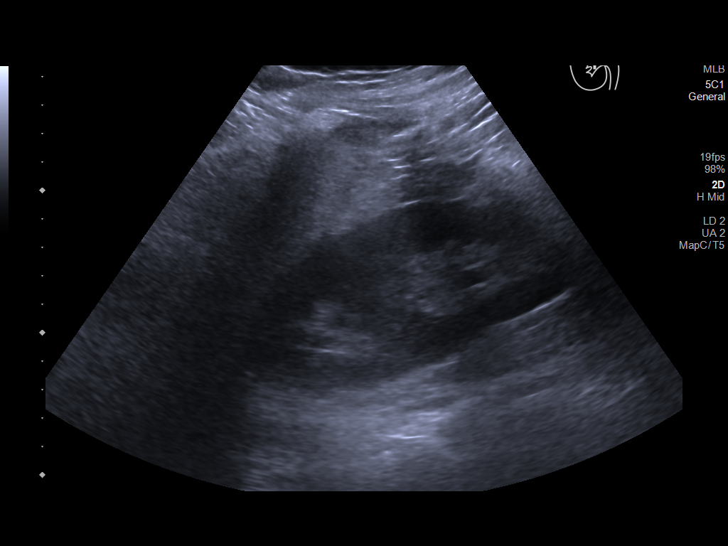
[im 8/31]
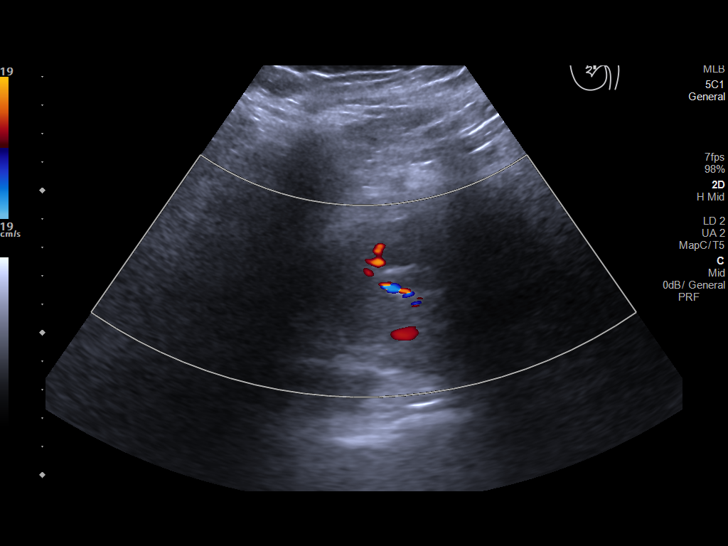
[im 11/31]
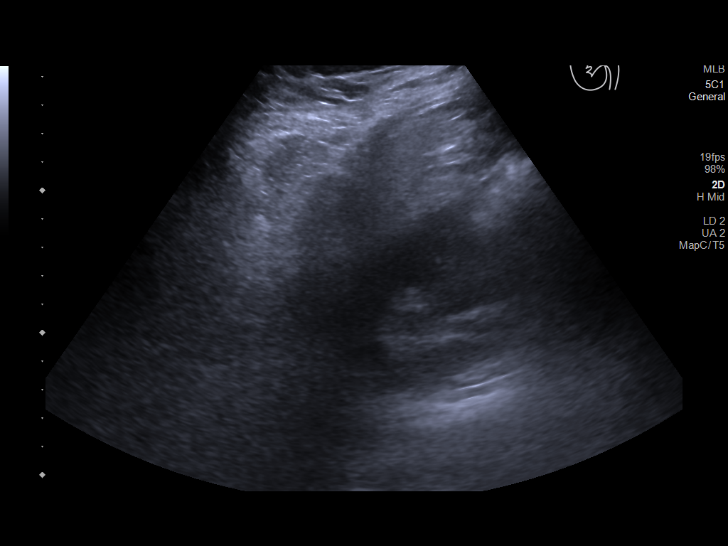
[im 12/31]
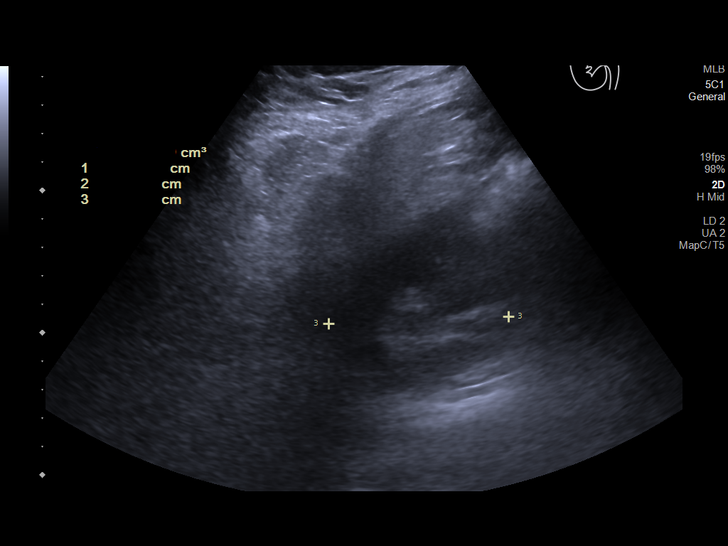
[im 14/31]
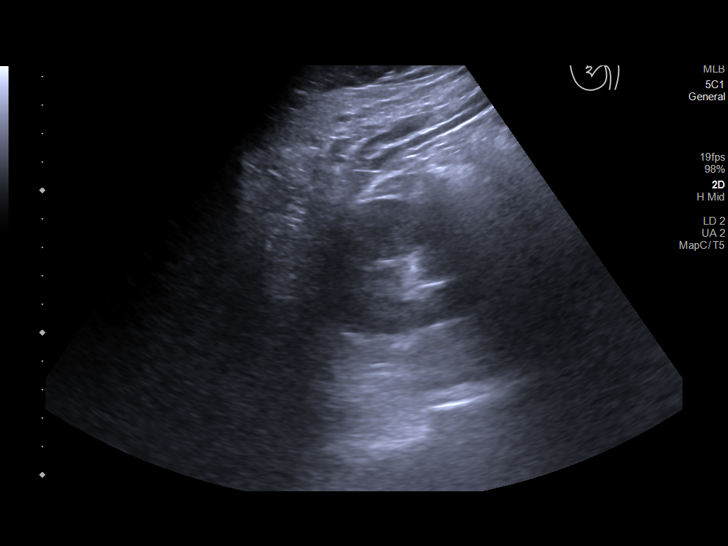
[im 17/31]
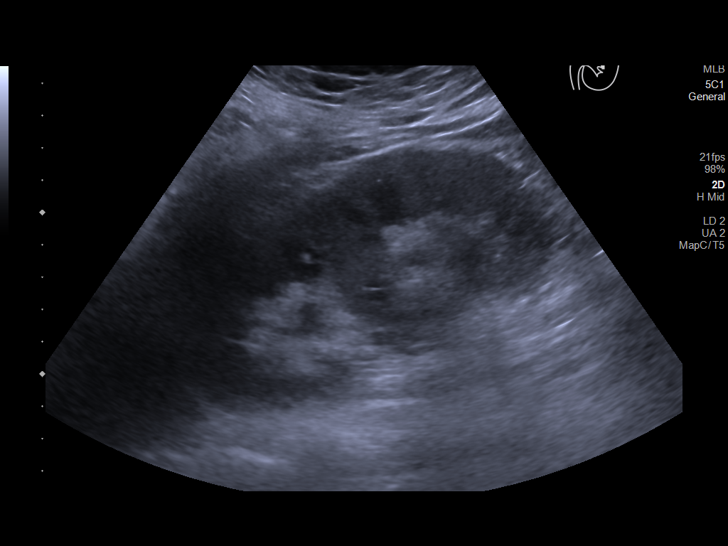
[im 19/31]
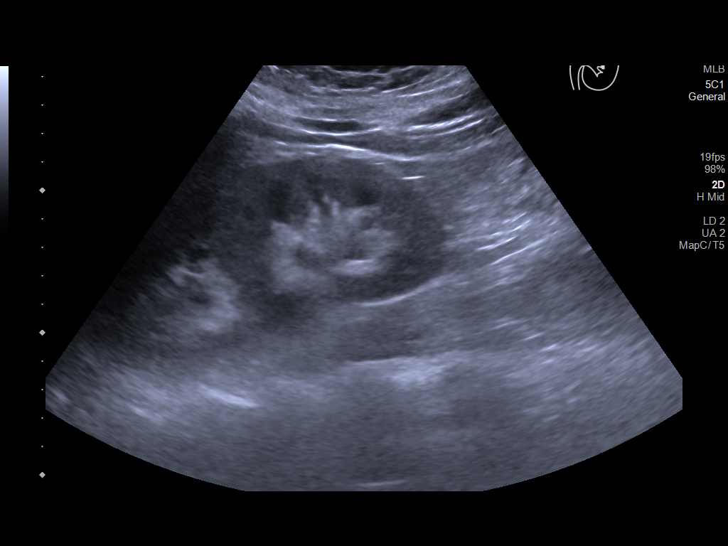
[im 21/31]
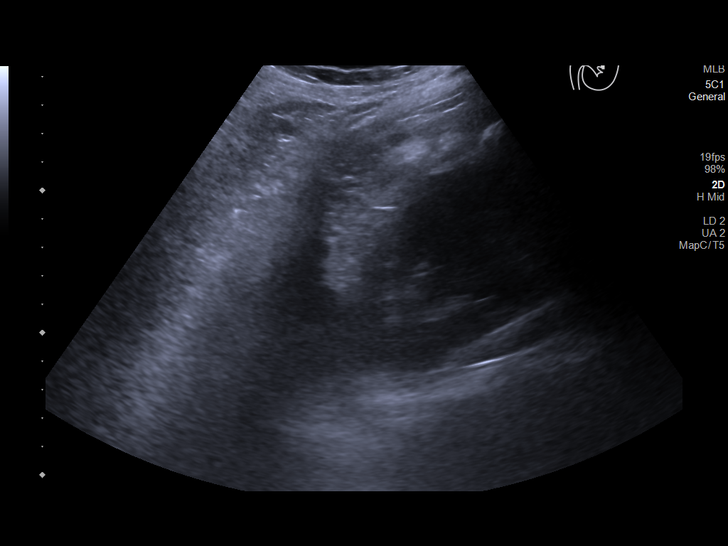
[im 23/31]
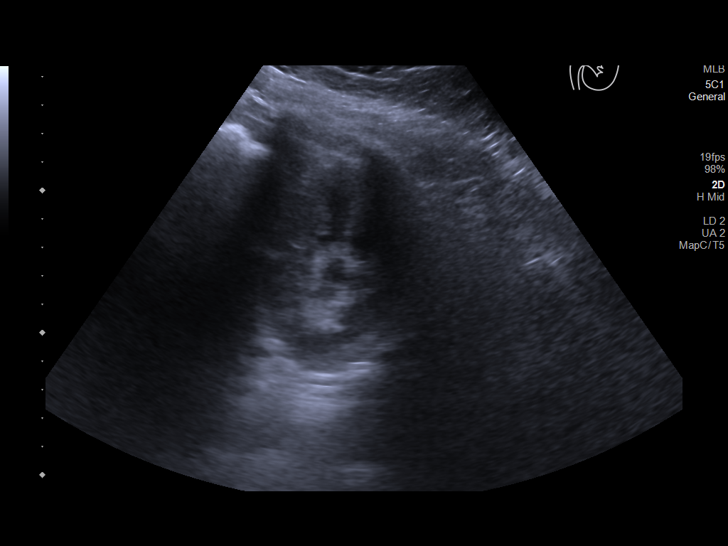
[im 26/31]
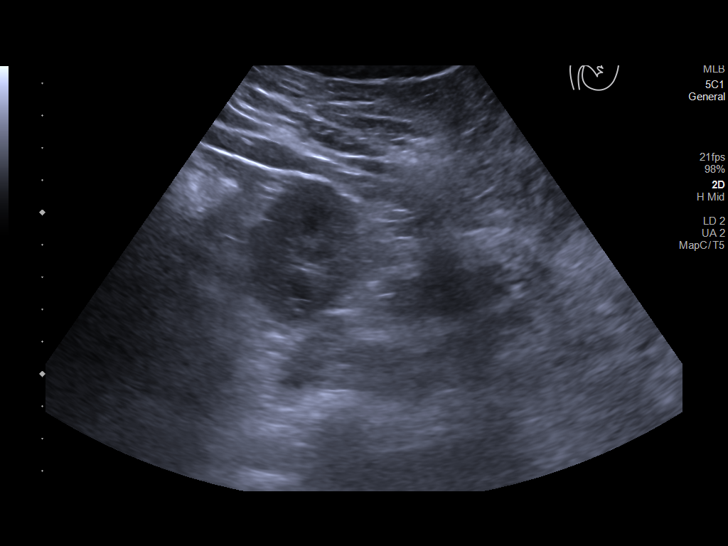
[im 28/31]
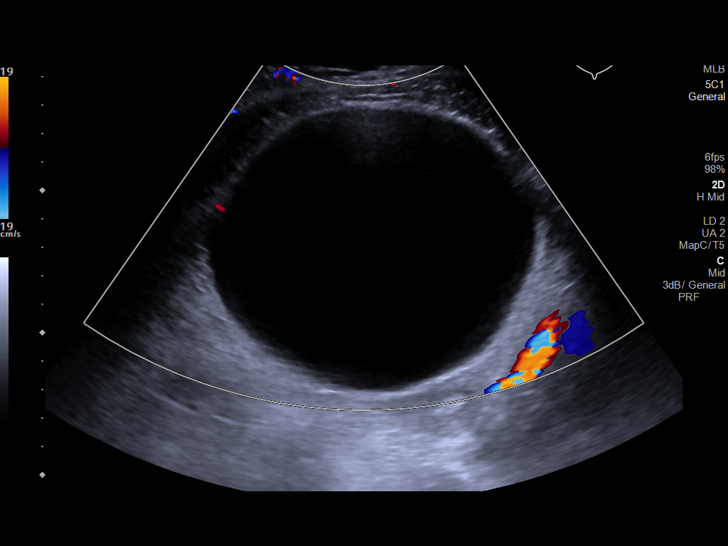
[im 31/31]
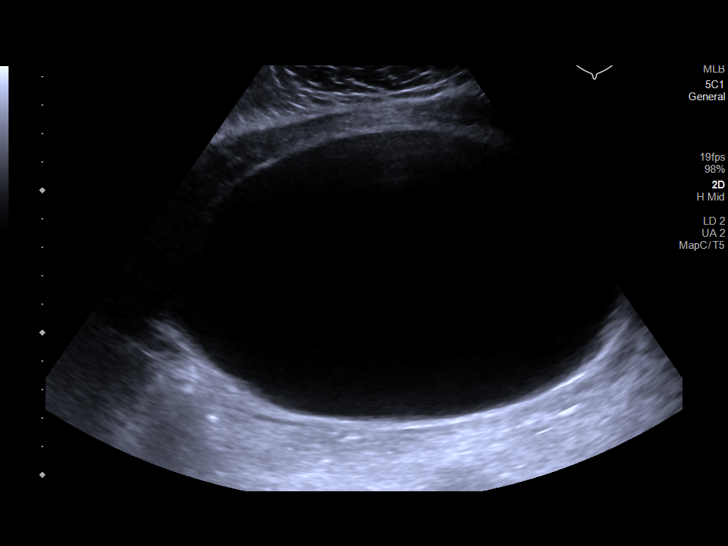

[14 of 25 positions shown; findings below may reference images not displayed]

FINDINGS: Right Kidney:

Renal measurements: 12.6 x 5.5 x 6.3 cm = volume: 230 mL .
Echogenicity within normal limits. No mass or hydronephrosis
visualized.

Left Kidney:

Renal measurements: 13.1 x 5.8 x 5.5 cm = volume: 218 mL.
Echogenicity within normal limits. No mass or hydronephrosis
visualized.

Bladder:

Appears normal for degree of bladder distention.
IMPRESSION: Normal kidneys.

## 2020-10-28 IMAGING — MR MRI HEAD WITHOUT CONTRAST
10 of 11 series · 42 of 48 positions shown · non-contrast
Comparison: CT head 07/05/2019

CLINICAL DATA: Altered level of consciousness.

EXAM:
MRI HEAD WITHOUT CONTRAST
TECHNIQUE: Multiplanar, multiecho pulse sequences of the brain and surrounding
structures were obtained without intravenous contrast.

[Series 5: DWI · axial · 3.0mm · 0.88mm/px · z∈[-128,+1]mm · 9 of 94 slices shown (1 of 4)]
[im 1/94]
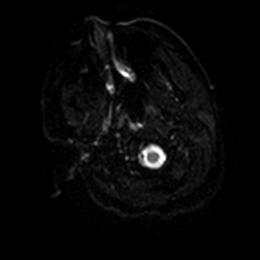
[im 12/94]
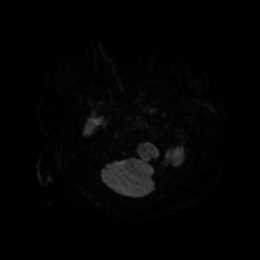
[im 24/94]
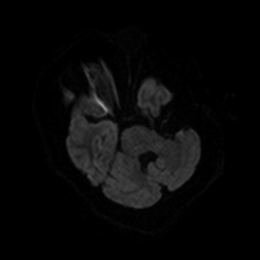
[im 35/94]
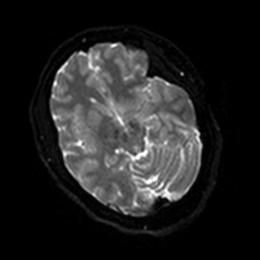
[im 47/94]
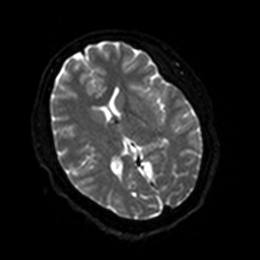
[im 59/94]
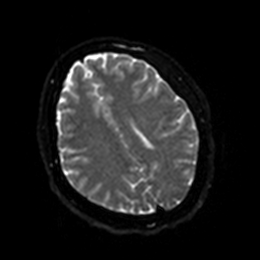
[im 70/94]
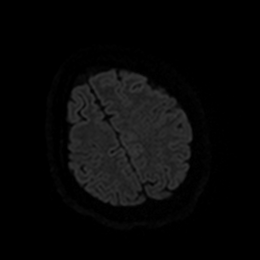
[im 82/94]
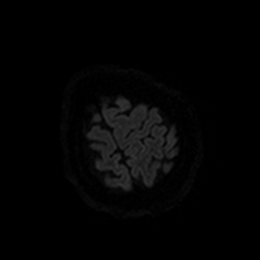
[im 94/94]
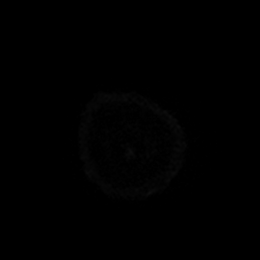

[Series 6: DWI · axial · 3.0mm · 0.88mm/px · z∈[-128,+1]mm · 4 of 47 slices shown (2 of 4)]
[im 1/47]
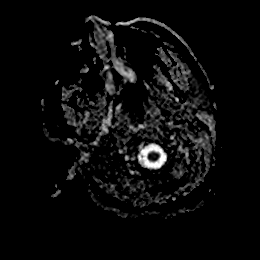
[im 16/47]
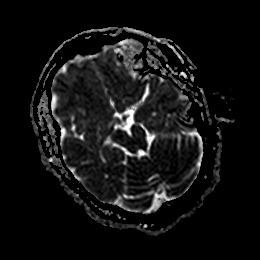
[im 31/47]
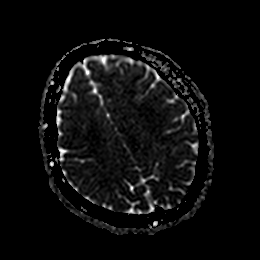
[im 47/47]
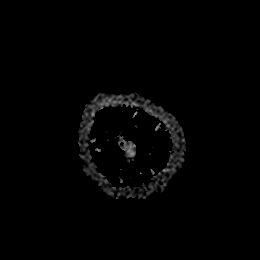

[Series 7: DWI · coronal · 4.0mm · 0.88mm/px · 6 of 64 slices shown (3 of 4)]
[im 1/64]
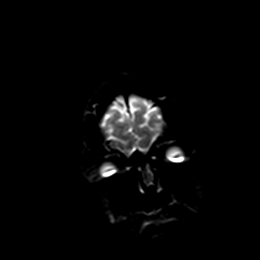
[im 13/64]
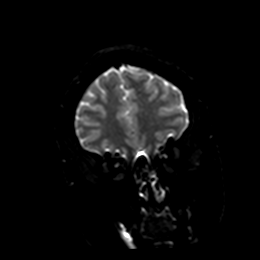
[im 26/64]
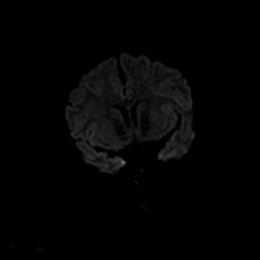
[im 38/64]
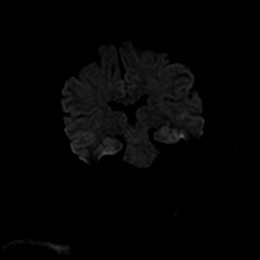
[im 51/64]
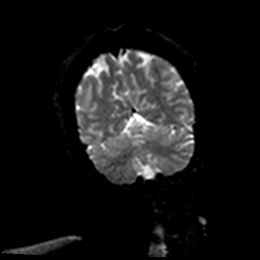
[im 64/64]
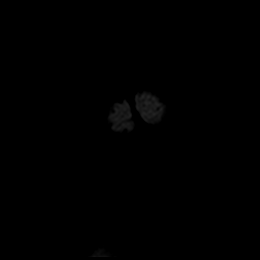

[Series 8: DWI · coronal · 4.0mm · 0.88mm/px · 3 of 32 slices shown (4 of 4)]
[im 1/32]
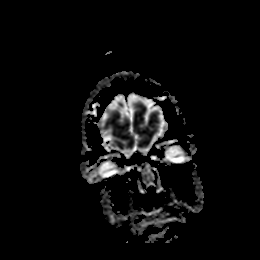
[im 16/32]
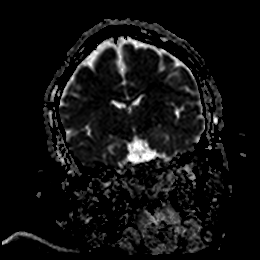
[im 32/32]
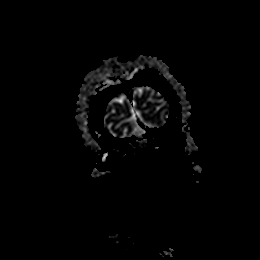

[Series 9: T2 · axial · 5.0mm · 0.72mm/px · z∈[-153,-27]mm · 2 of 25 slices shown (1 of 2)]
[im 1/25]
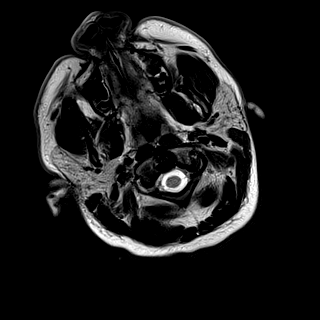
[im 25/25]
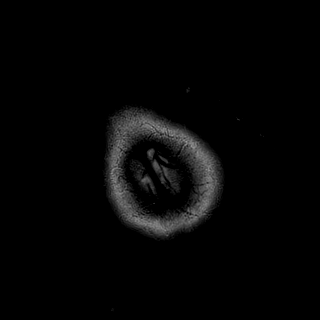

[Series 10: FLAIR · axial · 5.0mm · 0.45mm/px · z∈[-148,-21]mm · 2 of 25 slices shown]
[im 1/25]
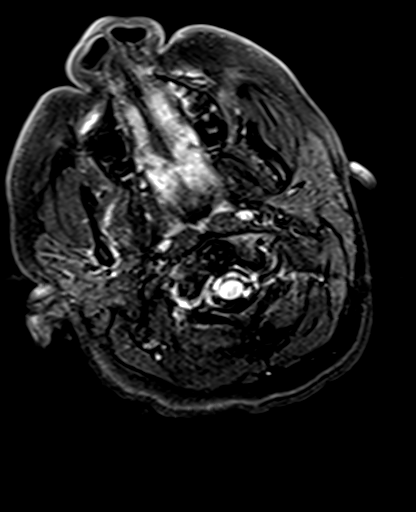
[im 25/25]
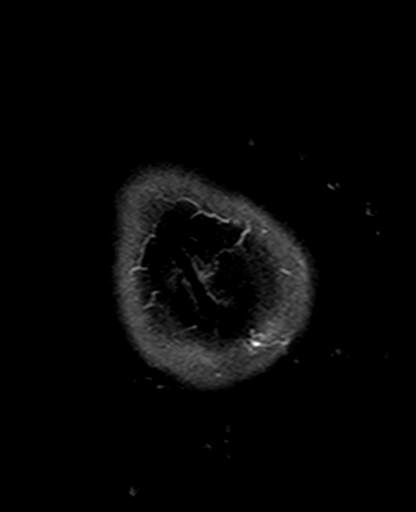

[Series 12: pha_images · axial · 3.0mm · 0.90mm/px · z∈[-159,-5]mm · 5 of 58 slices shown]
[im 1/58]
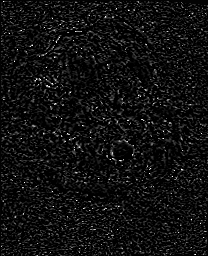
[im 15/58]
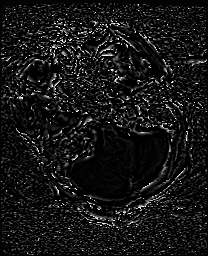
[im 29/58]
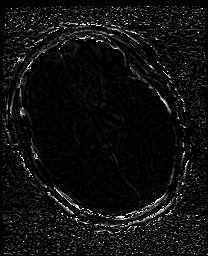
[im 43/58]
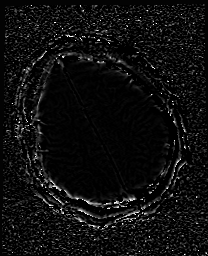
[im 58/58]
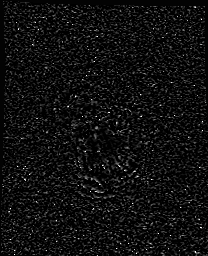

[Series 13: swi_images · axial · 3.0mm · 0.90mm/px · z∈[-162,-3]mm · 6 of 60 slices shown]
[im 1/60]
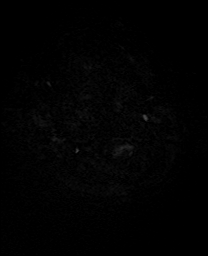
[im 12/60]
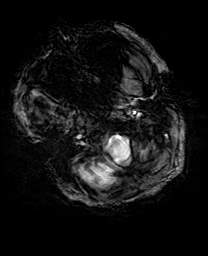
[im 24/60]
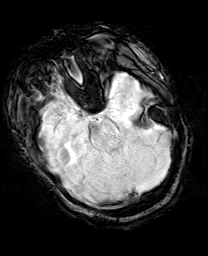
[im 36/60]
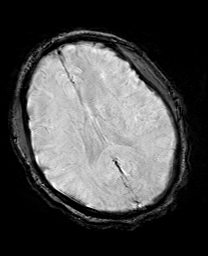
[im 48/60]
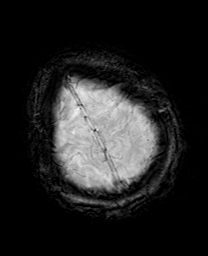
[im 60/60]
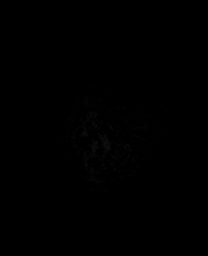

[Series 17: T2 · coronal · 5.0mm · 0.72mm/px · 3 of 28 slices shown (2 of 2)]
[im 1/28]
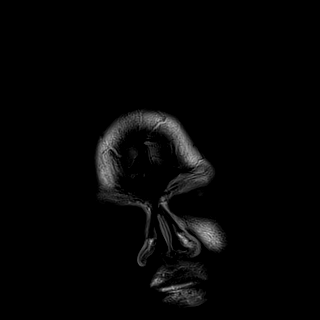
[im 14/28]
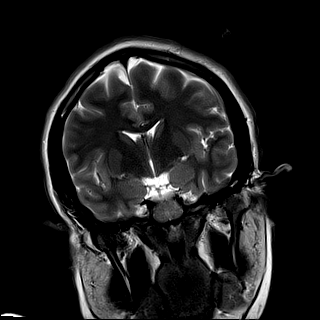
[im 28/28]
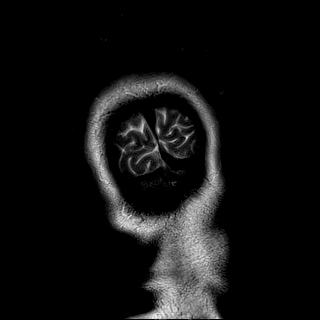

[Series 18: T1 · sagittal · 5.0mm · 0.75mm/px · 2 of 23 slices shown]
[im 1/23]
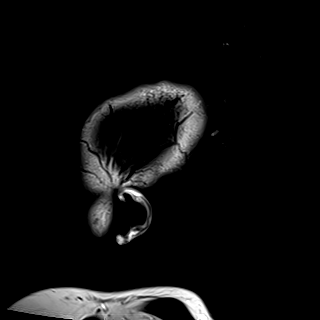
[im 23/23]
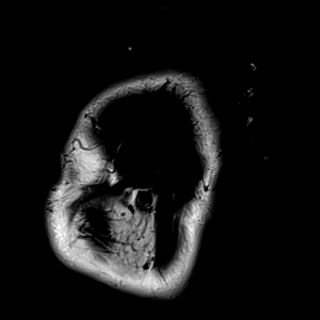

[42 of 48 positions shown; findings below may reference images not displayed]

FINDINGS: Brain: No acute infarction, hemorrhage, hydrocephalus, extra-axial
collection or mass lesion.

Vascular: Normal arterial flow voids.

Skull and upper cervical spine: Negative

Sinuses/Orbits: Negative

Other: None
IMPRESSION: Normal MRI of the brain.
